# Patient Record
Sex: Female | Born: 1950
Health system: Southern US, Community
[De-identification: ages and names within clinical notes are randomized; demographics above are authoritative.]

## PROBLEM LIST (undated history)

## (undated) ENCOUNTER — Emergency Department (HOSPITAL_BASED_OUTPATIENT_CLINIC_OR_DEPARTMENT_OTHER): Disposition: A | Discharge status: Home / Self Care | Payer: Medicare Other

## (undated) DIAGNOSIS — C50919 Malignant neoplasm of unspecified site of unspecified female breast: Secondary | ICD-10-CM

## (undated) DIAGNOSIS — M1990 Unspecified osteoarthritis, unspecified site: Secondary | ICD-10-CM

## (undated) DIAGNOSIS — I2699 Other pulmonary embolism without acute cor pulmonale: Secondary | ICD-10-CM

## (undated) DIAGNOSIS — E785 Hyperlipidemia, unspecified: Secondary | ICD-10-CM

## (undated) DIAGNOSIS — R531 Weakness: Secondary | ICD-10-CM

## (undated) DIAGNOSIS — G4700 Insomnia, unspecified: Secondary | ICD-10-CM

## (undated) DIAGNOSIS — E079 Disorder of thyroid, unspecified: Secondary | ICD-10-CM

## (undated) DIAGNOSIS — I10 Essential (primary) hypertension: Secondary | ICD-10-CM

## (undated) DIAGNOSIS — S92902A Unspecified fracture of left foot, initial encounter for closed fracture: Secondary | ICD-10-CM

## (undated) DIAGNOSIS — E279 Disorder of adrenal gland, unspecified: Secondary | ICD-10-CM

## (undated) DIAGNOSIS — Z923 Personal history of irradiation: Secondary | ICD-10-CM

## (undated) DIAGNOSIS — H269 Unspecified cataract: Secondary | ICD-10-CM

## (undated) DIAGNOSIS — R42 Dizziness and giddiness: Secondary | ICD-10-CM

## (undated) DIAGNOSIS — Z8719 Personal history of other diseases of the digestive system: Secondary | ICD-10-CM

## (undated) DIAGNOSIS — E039 Hypothyroidism, unspecified: Secondary | ICD-10-CM

## (undated) DIAGNOSIS — E278 Other specified disorders of adrenal gland: Secondary | ICD-10-CM

## (undated) HISTORY — DX: Unspecified cataract: H26.9

## (undated) HISTORY — PX: REPLACEMENT TOTAL KNEE: SUR1224

## (undated) HISTORY — DX: Hyperlipidemia, unspecified: E78.5

## (undated) HISTORY — DX: Unspecified osteoarthritis, unspecified site: M19.90

## (undated) HISTORY — PX: EYE SURGERY: SHX253

## (undated) HISTORY — DX: Unspecified fracture of left foot, initial encounter for closed fracture: S92.902A

## (undated) HISTORY — DX: Insomnia, unspecified: G47.00

## (undated) HISTORY — PX: APPENDECTOMY: SHX54

## (undated) HISTORY — DX: Essential (primary) hypertension: I10

## (undated) HISTORY — DX: Other specified disorders of adrenal gland: E27.8

## (undated) HISTORY — PX: CARPAL TUNNEL RELEASE: SHX101

## (undated) HISTORY — PX: COLONOSCOPY: SHX174

## (undated) HISTORY — DX: Malignant neoplasm of unspecified site of unspecified female breast: C50.919

## (undated) HISTORY — PX: CHOLECYSTECTOMY: SHX55

## (undated) HISTORY — DX: Disorder of adrenal gland, unspecified: E27.9

## (undated) HISTORY — DX: Other pulmonary embolism without acute cor pulmonale: I26.99

## (undated) HISTORY — PX: OTHER SURGICAL HISTORY: SHX169

---

## 1998-01-01 ENCOUNTER — Ambulatory Visit (HOSPITAL_COMMUNITY): Admission: RE | Admit: 1998-01-01 | Discharge: 1998-01-01 | Payer: Self-pay | Admitting: Obstetrics and Gynecology

## 1998-01-12 ENCOUNTER — Emergency Department (HOSPITAL_COMMUNITY): Admission: EM | Admit: 1998-01-12 | Discharge: 1998-01-12 | Payer: Self-pay | Admitting: Endocrinology

## 1998-12-24 ENCOUNTER — Encounter: Payer: Self-pay | Admitting: Orthopedic Surgery

## 1998-12-27 ENCOUNTER — Inpatient Hospital Stay (HOSPITAL_COMMUNITY): Admission: RE | Admit: 1998-12-27 | Discharge: 1999-01-06 | Payer: Self-pay | Admitting: Orthopedic Surgery

## 1998-12-28 ENCOUNTER — Encounter: Payer: Self-pay | Admitting: Orthopedic Surgery

## 1999-01-06 ENCOUNTER — Inpatient Hospital Stay
Admission: RE | Admit: 1999-01-06 | Discharge: 1999-01-29 | Payer: Self-pay | Admitting: Physical Medicine & Rehabilitation

## 1999-02-11 ENCOUNTER — Ambulatory Visit (HOSPITAL_COMMUNITY): Admission: RE | Admit: 1999-02-11 | Discharge: 1999-02-11 | Payer: Self-pay | Admitting: Orthopedic Surgery

## 1999-02-24 ENCOUNTER — Encounter: Admission: RE | Admit: 1999-02-24 | Discharge: 1999-05-25 | Payer: Self-pay | Admitting: Orthopedic Surgery

## 1999-04-01 ENCOUNTER — Ambulatory Visit (HOSPITAL_COMMUNITY): Admission: RE | Admit: 1999-04-01 | Discharge: 1999-04-01 | Payer: Self-pay | Admitting: Orthopedic Surgery

## 1999-05-09 ENCOUNTER — Encounter: Admission: RE | Admit: 1999-05-09 | Discharge: 1999-05-09 | Payer: Self-pay | Admitting: Obstetrics and Gynecology

## 1999-05-09 ENCOUNTER — Encounter: Payer: Self-pay | Admitting: Obstetrics and Gynecology

## 1999-10-14 DIAGNOSIS — I2699 Other pulmonary embolism without acute cor pulmonale: Secondary | ICD-10-CM

## 1999-10-14 HISTORY — DX: Other pulmonary embolism without acute cor pulmonale: I26.99

## 1999-11-11 ENCOUNTER — Encounter: Payer: Self-pay | Admitting: Emergency Medicine

## 1999-11-12 ENCOUNTER — Inpatient Hospital Stay (HOSPITAL_COMMUNITY): Admission: EM | Admit: 1999-11-12 | Discharge: 1999-11-15 | Payer: Self-pay | Admitting: Emergency Medicine

## 1999-11-12 ENCOUNTER — Encounter: Payer: Self-pay | Admitting: Emergency Medicine

## 2000-01-01 ENCOUNTER — Emergency Department (HOSPITAL_COMMUNITY): Admission: EM | Admit: 2000-01-01 | Discharge: 2000-01-01 | Payer: Self-pay

## 2000-05-10 ENCOUNTER — Encounter: Payer: Self-pay | Admitting: Obstetrics and Gynecology

## 2000-05-10 ENCOUNTER — Encounter: Admission: RE | Admit: 2000-05-10 | Discharge: 2000-05-10 | Payer: Self-pay | Admitting: Obstetrics and Gynecology

## 2000-05-13 ENCOUNTER — Encounter: Admission: RE | Admit: 2000-05-13 | Discharge: 2000-05-13 | Payer: Self-pay | Admitting: Obstetrics and Gynecology

## 2000-05-13 ENCOUNTER — Encounter: Payer: Self-pay | Admitting: Obstetrics and Gynecology

## 2000-05-18 ENCOUNTER — Other Ambulatory Visit: Admission: RE | Admit: 2000-05-18 | Discharge: 2000-05-18 | Payer: Self-pay | Admitting: Obstetrics and Gynecology

## 2000-05-27 ENCOUNTER — Encounter: Payer: Self-pay | Admitting: Obstetrics and Gynecology

## 2000-05-27 ENCOUNTER — Encounter: Admission: RE | Admit: 2000-05-27 | Discharge: 2000-05-27 | Payer: Self-pay | Admitting: Obstetrics and Gynecology

## 2001-05-18 ENCOUNTER — Encounter: Admission: RE | Admit: 2001-05-18 | Discharge: 2001-05-18 | Payer: Self-pay | Admitting: Obstetrics and Gynecology

## 2001-05-18 ENCOUNTER — Encounter: Payer: Self-pay | Admitting: Obstetrics and Gynecology

## 2001-05-31 ENCOUNTER — Encounter: Admission: RE | Admit: 2001-05-31 | Discharge: 2001-05-31 | Payer: Self-pay | Admitting: Obstetrics and Gynecology

## 2001-05-31 ENCOUNTER — Encounter: Payer: Self-pay | Admitting: Obstetrics and Gynecology

## 2001-06-02 ENCOUNTER — Other Ambulatory Visit: Admission: RE | Admit: 2001-06-02 | Discharge: 2001-06-02 | Payer: Self-pay | Admitting: Obstetrics and Gynecology

## 2001-12-30 ENCOUNTER — Emergency Department (HOSPITAL_COMMUNITY): Admission: EM | Admit: 2001-12-30 | Discharge: 2001-12-30 | Payer: Self-pay | Admitting: Emergency Medicine

## 2001-12-30 ENCOUNTER — Encounter: Payer: Self-pay | Admitting: Emergency Medicine

## 2002-01-10 ENCOUNTER — Encounter: Admission: RE | Admit: 2002-01-10 | Discharge: 2002-01-10 | Payer: Self-pay | Admitting: Family Medicine

## 2002-01-10 ENCOUNTER — Encounter: Payer: Self-pay | Admitting: Family Medicine

## 2002-05-11 ENCOUNTER — Emergency Department (HOSPITAL_COMMUNITY): Admission: EM | Admit: 2002-05-11 | Discharge: 2002-05-11 | Payer: Self-pay | Admitting: Emergency Medicine

## 2002-05-11 ENCOUNTER — Encounter: Payer: Self-pay | Admitting: Emergency Medicine

## 2002-06-01 ENCOUNTER — Encounter: Admission: RE | Admit: 2002-06-01 | Discharge: 2002-06-01 | Payer: Self-pay | Admitting: Obstetrics and Gynecology

## 2002-06-01 ENCOUNTER — Encounter: Payer: Self-pay | Admitting: Obstetrics and Gynecology

## 2002-06-28 ENCOUNTER — Inpatient Hospital Stay (HOSPITAL_COMMUNITY): Admission: RE | Admit: 2002-06-28 | Discharge: 2002-07-04 | Payer: Self-pay | Admitting: Orthopedic Surgery

## 2002-06-28 ENCOUNTER — Encounter: Payer: Self-pay | Admitting: Orthopedic Surgery

## 2002-07-04 ENCOUNTER — Inpatient Hospital Stay
Admission: RE | Admit: 2002-07-04 | Discharge: 2002-07-27 | Payer: Self-pay | Admitting: Physical Medicine & Rehabilitation

## 2002-08-23 ENCOUNTER — Encounter: Admission: RE | Admit: 2002-08-23 | Discharge: 2002-11-21 | Payer: Self-pay | Admitting: Orthopedic Surgery

## 2002-11-22 ENCOUNTER — Encounter: Admission: RE | Admit: 2002-11-22 | Discharge: 2003-02-20 | Payer: Self-pay | Admitting: Orthopedic Surgery

## 2003-02-21 ENCOUNTER — Encounter: Admission: RE | Admit: 2003-02-21 | Discharge: 2003-02-22 | Payer: Self-pay | Admitting: Orthopedic Surgery

## 2003-07-18 ENCOUNTER — Encounter: Admission: RE | Admit: 2003-07-18 | Discharge: 2003-07-18 | Payer: Self-pay | Admitting: Family Medicine

## 2004-06-15 DIAGNOSIS — C50919 Malignant neoplasm of unspecified site of unspecified female breast: Secondary | ICD-10-CM

## 2004-06-15 HISTORY — PX: BREAST LUMPECTOMY: SHX2

## 2004-06-15 HISTORY — DX: Malignant neoplasm of unspecified site of unspecified female breast: C50.919

## 2004-08-28 ENCOUNTER — Ambulatory Visit: Payer: Self-pay | Admitting: Family Medicine

## 2004-09-01 ENCOUNTER — Encounter: Admission: RE | Admit: 2004-09-01 | Discharge: 2004-09-01 | Payer: Self-pay | Admitting: Family Medicine

## 2004-09-08 ENCOUNTER — Encounter: Admission: RE | Admit: 2004-09-08 | Discharge: 2004-09-08 | Payer: Self-pay | Admitting: Family Medicine

## 2004-10-22 ENCOUNTER — Encounter (INDEPENDENT_AMBULATORY_CARE_PROVIDER_SITE_OTHER): Payer: Self-pay | Admitting: Specialist

## 2004-10-22 ENCOUNTER — Encounter: Admission: RE | Admit: 2004-10-22 | Discharge: 2004-10-22 | Payer: Self-pay | Admitting: Family Medicine

## 2004-10-22 ENCOUNTER — Encounter (INDEPENDENT_AMBULATORY_CARE_PROVIDER_SITE_OTHER): Payer: Self-pay | Admitting: Diagnostic Radiology

## 2004-10-27 ENCOUNTER — Ambulatory Visit: Payer: Self-pay | Admitting: Family Medicine

## 2004-10-28 ENCOUNTER — Encounter: Admission: RE | Admit: 2004-10-28 | Discharge: 2004-10-28 | Payer: Self-pay | Admitting: General Surgery

## 2004-10-30 ENCOUNTER — Ambulatory Visit (HOSPITAL_BASED_OUTPATIENT_CLINIC_OR_DEPARTMENT_OTHER): Admission: RE | Admit: 2004-10-30 | Discharge: 2004-10-30 | Payer: Self-pay | Admitting: General Surgery

## 2004-10-30 ENCOUNTER — Encounter (INDEPENDENT_AMBULATORY_CARE_PROVIDER_SITE_OTHER): Payer: Self-pay | Admitting: *Deleted

## 2004-10-30 ENCOUNTER — Ambulatory Visit (HOSPITAL_COMMUNITY): Admission: RE | Admit: 2004-10-30 | Discharge: 2004-10-30 | Payer: Self-pay | Admitting: General Surgery

## 2004-11-28 ENCOUNTER — Ambulatory Visit: Payer: Self-pay | Admitting: Family Medicine

## 2004-11-28 ENCOUNTER — Ambulatory Visit: Payer: Self-pay | Admitting: Oncology

## 2004-12-15 ENCOUNTER — Ambulatory Visit: Admission: RE | Admit: 2004-12-15 | Discharge: 2005-02-11 | Payer: Self-pay | Admitting: Radiation Oncology

## 2005-01-26 ENCOUNTER — Ambulatory Visit: Payer: Self-pay | Admitting: Family Medicine

## 2005-02-06 ENCOUNTER — Ambulatory Visit: Payer: Self-pay | Admitting: Oncology

## 2005-02-10 ENCOUNTER — Ambulatory Visit: Payer: Self-pay | Admitting: Family Medicine

## 2005-02-11 ENCOUNTER — Encounter: Admission: RE | Admit: 2005-02-11 | Discharge: 2005-02-11 | Payer: Self-pay | Admitting: Family Medicine

## 2005-03-11 ENCOUNTER — Ambulatory Visit: Payer: Self-pay | Admitting: Endocrinology

## 2005-03-20 ENCOUNTER — Ambulatory Visit: Payer: Self-pay | Admitting: Family Medicine

## 2005-03-25 ENCOUNTER — Ambulatory Visit: Payer: Self-pay | Admitting: Endocrinology

## 2005-04-01 ENCOUNTER — Ambulatory Visit: Payer: Self-pay | Admitting: Endocrinology

## 2005-04-03 ENCOUNTER — Ambulatory Visit: Payer: Self-pay | Admitting: Oncology

## 2005-04-08 ENCOUNTER — Ambulatory Visit: Payer: Self-pay | Admitting: Endocrinology

## 2005-06-25 ENCOUNTER — Encounter: Admission: RE | Admit: 2005-06-25 | Discharge: 2005-06-25 | Payer: Self-pay | Admitting: Surgery

## 2005-07-09 ENCOUNTER — Ambulatory Visit: Payer: Self-pay | Admitting: Endocrinology

## 2005-07-20 ENCOUNTER — Encounter: Admission: RE | Admit: 2005-07-20 | Discharge: 2005-07-20 | Payer: Self-pay | Admitting: General Surgery

## 2005-08-08 ENCOUNTER — Encounter: Admission: RE | Admit: 2005-08-08 | Discharge: 2005-08-08 | Payer: Self-pay | Admitting: Endocrinology

## 2005-10-05 ENCOUNTER — Ambulatory Visit: Payer: Self-pay | Admitting: Oncology

## 2005-10-05 LAB — CBC WITH DIFFERENTIAL/PLATELET
Basophils Absolute: 0 10*3/uL (ref 0.0–0.1)
Eosinophils Absolute: 0.1 10*3/uL (ref 0.0–0.5)
HGB: 11.3 g/dL — ABNORMAL LOW (ref 11.6–15.9)
NEUT#: 5.6 10*3/uL (ref 1.5–6.5)
RBC: 3.97 10*6/uL (ref 3.70–5.32)
RDW: 17.4 % — ABNORMAL HIGH (ref 11.3–14.5)
WBC: 7.3 10*3/uL (ref 3.9–10.0)
lymph#: 1.3 10*3/uL (ref 0.9–3.3)

## 2005-10-05 LAB — COMPREHENSIVE METABOLIC PANEL
AST: 15 U/L (ref 0–37)
Albumin: 4 g/dL (ref 3.5–5.2)
BUN: 14 mg/dL (ref 6–23)
Calcium: 9.1 mg/dL (ref 8.4–10.5)
Chloride: 103 mEq/L (ref 96–112)
Glucose, Bld: 118 mg/dL — ABNORMAL HIGH (ref 70–99)
Potassium: 3.6 mEq/L (ref 3.5–5.3)
Sodium: 138 mEq/L (ref 135–145)
Total Protein: 7 g/dL (ref 6.0–8.3)

## 2005-11-03 ENCOUNTER — Encounter: Admission: RE | Admit: 2005-11-03 | Discharge: 2005-11-03 | Payer: Self-pay | Admitting: Oncology

## 2005-11-16 ENCOUNTER — Ambulatory Visit: Payer: Self-pay | Admitting: Family Medicine

## 2005-11-17 ENCOUNTER — Encounter: Admission: RE | Admit: 2005-11-17 | Discharge: 2005-11-17 | Payer: Self-pay | Admitting: Family Medicine

## 2005-11-19 ENCOUNTER — Ambulatory Visit: Payer: Self-pay | Admitting: Family Medicine

## 2006-03-03 ENCOUNTER — Ambulatory Visit: Payer: Self-pay | Admitting: Family Medicine

## 2006-03-15 ENCOUNTER — Ambulatory Visit: Payer: Self-pay | Admitting: Family Medicine

## 2006-04-01 ENCOUNTER — Ambulatory Visit: Payer: Self-pay | Admitting: Family Medicine

## 2006-04-01 ENCOUNTER — Ambulatory Visit: Payer: Self-pay | Admitting: Oncology

## 2006-04-08 ENCOUNTER — Encounter: Admission: RE | Admit: 2006-04-08 | Discharge: 2006-04-08 | Payer: Self-pay | Admitting: Oncology

## 2006-04-08 ENCOUNTER — Encounter (INDEPENDENT_AMBULATORY_CARE_PROVIDER_SITE_OTHER): Payer: Self-pay | Admitting: Specialist

## 2006-05-13 ENCOUNTER — Ambulatory Visit: Payer: Self-pay | Admitting: Family Medicine

## 2006-05-20 ENCOUNTER — Emergency Department (HOSPITAL_COMMUNITY): Admission: EM | Admit: 2006-05-20 | Discharge: 2006-05-20 | Payer: Self-pay | Admitting: Emergency Medicine

## 2006-05-21 ENCOUNTER — Ambulatory Visit: Payer: Self-pay | Admitting: Emergency Medicine

## 2006-05-21 LAB — CONVERTED CEMR LAB
Alkaline Phosphatase: 114 units/L (ref 39–117)
BUN: 12 mg/dL (ref 6–23)
Calcium: 9.4 mg/dL (ref 8.4–10.5)
Creatinine, Ser: 0.7 mg/dL (ref 0.4–1.2)
GFR calc non Af Amer: 92 mL/min
Potassium: 4.2 meq/L (ref 3.5–5.1)
Sodium: 141 meq/L (ref 135–145)
Total Protein: 7 g/dL (ref 6.0–8.3)

## 2006-09-29 ENCOUNTER — Ambulatory Visit: Payer: Self-pay | Admitting: Oncology

## 2006-09-29 DIAGNOSIS — E785 Hyperlipidemia, unspecified: Secondary | ICD-10-CM | POA: Insufficient documentation

## 2006-09-29 DIAGNOSIS — Z9889 Other specified postprocedural states: Secondary | ICD-10-CM

## 2006-09-29 DIAGNOSIS — Z853 Personal history of malignant neoplasm of breast: Secondary | ICD-10-CM

## 2006-09-29 DIAGNOSIS — I10 Essential (primary) hypertension: Secondary | ICD-10-CM | POA: Insufficient documentation

## 2006-09-29 DIAGNOSIS — Z96659 Presence of unspecified artificial knee joint: Secondary | ICD-10-CM

## 2006-09-29 DIAGNOSIS — M1990 Unspecified osteoarthritis, unspecified site: Secondary | ICD-10-CM | POA: Insufficient documentation

## 2006-09-29 DIAGNOSIS — J811 Chronic pulmonary edema: Secondary | ICD-10-CM

## 2006-09-29 DIAGNOSIS — I82409 Acute embolism and thrombosis of unspecified deep veins of unspecified lower extremity: Secondary | ICD-10-CM | POA: Insufficient documentation

## 2006-09-29 DIAGNOSIS — G839 Paralytic syndrome, unspecified: Secondary | ICD-10-CM

## 2006-10-04 LAB — CBC WITH DIFFERENTIAL/PLATELET
BASO%: 0.2 % (ref 0.0–2.0)
Eosinophils Absolute: 0.1 10*3/uL (ref 0.0–0.5)
LYMPH%: 21.1 % (ref 14.0–48.0)
MCHC: 34.3 g/dL (ref 32.0–36.0)
MCV: 83 fL (ref 81.0–101.0)
MONO%: 4.4 % (ref 0.0–13.0)
NEUT#: 5.6 10*3/uL (ref 1.5–6.5)
Platelets: 238 10*3/uL (ref 145–400)
RBC: 4.17 10*6/uL (ref 3.70–5.32)
RDW: 16.9 % — ABNORMAL HIGH (ref 11.3–14.5)
WBC: 7.6 10*3/uL (ref 3.9–10.0)

## 2006-11-09 ENCOUNTER — Encounter: Admission: RE | Admit: 2006-11-09 | Discharge: 2006-11-09 | Payer: Self-pay | Admitting: Oncology

## 2007-01-20 ENCOUNTER — Encounter: Payer: Self-pay | Admitting: Family Medicine

## 2007-03-12 ENCOUNTER — Encounter: Payer: Self-pay | Admitting: *Deleted

## 2007-03-12 DIAGNOSIS — Z86718 Personal history of other venous thrombosis and embolism: Secondary | ICD-10-CM | POA: Insufficient documentation

## 2007-03-24 ENCOUNTER — Ambulatory Visit: Payer: Self-pay | Admitting: Oncology

## 2007-03-28 LAB — COMPREHENSIVE METABOLIC PANEL
AST: 16 U/L (ref 0–37)
Albumin: 4 g/dL (ref 3.5–5.2)
BUN: 11 mg/dL (ref 6–23)
Calcium: 9.7 mg/dL (ref 8.4–10.5)
Chloride: 103 mEq/L (ref 96–112)
Creatinine, Ser: 0.51 mg/dL (ref 0.40–1.20)
Glucose, Bld: 111 mg/dL — ABNORMAL HIGH (ref 70–99)
Potassium: 4.2 mEq/L (ref 3.5–5.3)

## 2007-03-28 LAB — CBC WITH DIFFERENTIAL/PLATELET
BASO%: 0.3 % (ref 0.0–2.0)
Eosinophils Absolute: 0.1 10*3/uL (ref 0.0–0.5)
LYMPH%: 23.1 % (ref 14.0–48.0)
MCHC: 34.2 g/dL (ref 32.0–36.0)
MONO#: 0.4 10*3/uL (ref 0.1–0.9)
NEUT#: 5 10*3/uL (ref 1.5–6.5)
Platelets: 224 10*3/uL (ref 145–400)
RBC: 4.07 10*6/uL (ref 3.70–5.32)
RDW: 16.2 % — ABNORMAL HIGH (ref 11.3–14.5)
WBC: 7.1 10*3/uL (ref 3.9–10.0)
lymph#: 1.6 10*3/uL (ref 0.9–3.3)

## 2007-03-30 ENCOUNTER — Ambulatory Visit: Payer: Self-pay | Admitting: Family Medicine

## 2007-04-04 ENCOUNTER — Encounter: Payer: Self-pay | Admitting: Family Medicine

## 2007-04-28 ENCOUNTER — Ambulatory Visit: Payer: Self-pay | Admitting: Family Medicine

## 2007-04-28 ENCOUNTER — Ambulatory Visit: Payer: Self-pay

## 2007-04-28 ENCOUNTER — Telehealth: Payer: Self-pay | Admitting: Family Medicine

## 2007-04-28 DIAGNOSIS — M79609 Pain in unspecified limb: Secondary | ICD-10-CM | POA: Insufficient documentation

## 2007-04-29 ENCOUNTER — Encounter: Payer: Self-pay | Admitting: Family Medicine

## 2007-05-02 ENCOUNTER — Encounter (INDEPENDENT_AMBULATORY_CARE_PROVIDER_SITE_OTHER): Payer: Self-pay | Admitting: *Deleted

## 2007-05-04 ENCOUNTER — Encounter (INDEPENDENT_AMBULATORY_CARE_PROVIDER_SITE_OTHER): Payer: Self-pay | Admitting: *Deleted

## 2007-07-12 ENCOUNTER — Encounter: Payer: Self-pay | Admitting: Family Medicine

## 2007-07-13 ENCOUNTER — Encounter: Payer: Self-pay | Admitting: Family Medicine

## 2007-07-15 ENCOUNTER — Encounter: Payer: Self-pay | Admitting: Family Medicine

## 2007-08-01 ENCOUNTER — Ambulatory Visit: Payer: Self-pay | Admitting: Family Medicine

## 2007-08-10 ENCOUNTER — Encounter: Payer: Self-pay | Admitting: Family Medicine

## 2007-08-12 LAB — CONVERTED CEMR LAB
Albumin: 3.5 g/dL (ref 3.5–5.2)
Alkaline Phosphatase: 93 units/L (ref 39–117)
Bilirubin, Direct: 0.1 mg/dL (ref 0.0–0.3)
HDL: 32.9 mg/dL — ABNORMAL LOW (ref >39.0)
Total Bilirubin: 0.9 mg/dL (ref 0.3–1.2)
Total Protein: 7.4 g/dL (ref 6.0–8.3)
Triglycerides: 81 mg/dL (ref 0–149)

## 2007-08-18 ENCOUNTER — Telehealth (INDEPENDENT_AMBULATORY_CARE_PROVIDER_SITE_OTHER): Payer: Self-pay | Admitting: *Deleted

## 2007-09-28 ENCOUNTER — Ambulatory Visit: Payer: Self-pay | Admitting: Oncology

## 2007-10-03 ENCOUNTER — Encounter: Payer: Self-pay | Admitting: Family Medicine

## 2007-10-03 LAB — CBC WITH DIFFERENTIAL/PLATELET
Basophils Absolute: 0 10*3/uL (ref 0.0–0.1)
EOS%: 0.8 % (ref 0.0–7.0)
HCT: 33.7 % — ABNORMAL LOW (ref 34.8–46.6)
HGB: 11.3 g/dL — ABNORMAL LOW (ref 11.6–15.9)
MCH: 27.6 pg (ref 26.0–34.0)
MCV: 82.5 fL (ref 81.0–101.0)
NEUT%: 75.5 % (ref 39.6–76.8)
lymph#: 1.6 10*3/uL (ref 0.9–3.3)

## 2007-10-03 LAB — COMPREHENSIVE METABOLIC PANEL
AST: 23 U/L (ref 0–37)
BUN: 13 mg/dL (ref 6–23)
Calcium: 8.5 mg/dL (ref 8.4–10.5)
Chloride: 108 mEq/L (ref 96–112)
Creatinine, Ser: 0.5 mg/dL (ref 0.40–1.20)
Glucose, Bld: 121 mg/dL — ABNORMAL HIGH (ref 70–99)

## 2007-10-04 LAB — CANCER ANTIGEN 27.29: CA 27.29: 10 U/mL (ref 0–39)

## 2007-10-04 LAB — VITAMIN D 25 HYDROXY (VIT D DEFICIENCY, FRACTURES): Vit D, 25-Hydroxy: 44 ng/mL (ref 30–89)

## 2007-10-06 ENCOUNTER — Ambulatory Visit: Payer: Self-pay | Admitting: Family Medicine

## 2007-10-06 ENCOUNTER — Encounter: Admission: RE | Admit: 2007-10-06 | Discharge: 2007-10-06 | Payer: Self-pay | Admitting: Oncology

## 2007-10-06 ENCOUNTER — Encounter (INDEPENDENT_AMBULATORY_CARE_PROVIDER_SITE_OTHER): Payer: Self-pay | Admitting: *Deleted

## 2007-10-06 DIAGNOSIS — R609 Edema, unspecified: Secondary | ICD-10-CM | POA: Insufficient documentation

## 2007-10-06 LAB — CONVERTED CEMR LAB
BUN: 12 mg/dL (ref 6–23)
CO2: 32 meq/L (ref 19–32)
Calcium: 9.6 mg/dL (ref 8.4–10.5)
Chloride: 104 meq/L (ref 96–112)
GFR calc Af Amer: 132 mL/min
Glucose, Bld: 96 mg/dL (ref 70–99)
Sodium: 143 meq/L (ref 135–145)

## 2007-10-09 LAB — VITAMIN D 1,25 DIHYDROXY: Vit D, 1,25-Dihydroxy: 36 pg/mL (ref 15–75)

## 2007-10-17 ENCOUNTER — Telehealth (INDEPENDENT_AMBULATORY_CARE_PROVIDER_SITE_OTHER): Payer: Self-pay | Admitting: *Deleted

## 2007-10-18 ENCOUNTER — Telehealth (INDEPENDENT_AMBULATORY_CARE_PROVIDER_SITE_OTHER): Payer: Self-pay | Admitting: *Deleted

## 2007-10-18 ENCOUNTER — Ambulatory Visit: Payer: Self-pay | Admitting: Internal Medicine

## 2007-10-18 DIAGNOSIS — J209 Acute bronchitis, unspecified: Secondary | ICD-10-CM

## 2007-10-20 ENCOUNTER — Ambulatory Visit: Payer: Self-pay | Admitting: Internal Medicine

## 2007-10-26 LAB — CONVERTED CEMR LAB
BUN: 14 mg/dL (ref 6–23)
Creatinine, Ser: 0.7 mg/dL (ref 0.4–1.2)
Potassium: 3.5 meq/L (ref 3.5–5.1)

## 2007-11-25 ENCOUNTER — Telehealth: Payer: Self-pay | Admitting: Family Medicine

## 2007-11-28 ENCOUNTER — Encounter (INDEPENDENT_AMBULATORY_CARE_PROVIDER_SITE_OTHER): Payer: Self-pay | Admitting: *Deleted

## 2007-11-29 ENCOUNTER — Telehealth (INDEPENDENT_AMBULATORY_CARE_PROVIDER_SITE_OTHER): Payer: Self-pay | Admitting: *Deleted

## 2007-11-30 ENCOUNTER — Encounter (INDEPENDENT_AMBULATORY_CARE_PROVIDER_SITE_OTHER): Payer: Self-pay | Admitting: *Deleted

## 2007-12-01 ENCOUNTER — Encounter: Admission: RE | Admit: 2007-12-01 | Discharge: 2007-12-01 | Payer: Self-pay | Admitting: Oncology

## 2007-12-07 ENCOUNTER — Encounter: Payer: Self-pay | Admitting: Internal Medicine

## 2007-12-07 ENCOUNTER — Ambulatory Visit: Admission: RE | Admit: 2007-12-07 | Discharge: 2007-12-07 | Payer: Self-pay | Admitting: Internal Medicine

## 2007-12-07 ENCOUNTER — Ambulatory Visit: Payer: Self-pay | Admitting: Internal Medicine

## 2007-12-07 ENCOUNTER — Ambulatory Visit: Payer: Self-pay | Admitting: Vascular Surgery

## 2007-12-08 ENCOUNTER — Telehealth (INDEPENDENT_AMBULATORY_CARE_PROVIDER_SITE_OTHER): Payer: Self-pay | Admitting: *Deleted

## 2007-12-23 ENCOUNTER — Emergency Department (HOSPITAL_COMMUNITY): Admission: EM | Admit: 2007-12-23 | Discharge: 2007-12-23 | Payer: Self-pay | Admitting: Emergency Medicine

## 2007-12-23 ENCOUNTER — Telehealth: Payer: Self-pay | Admitting: Family Medicine

## 2007-12-27 ENCOUNTER — Ambulatory Visit: Payer: Self-pay | Admitting: Family Medicine

## 2007-12-27 DIAGNOSIS — N390 Urinary tract infection, site not specified: Secondary | ICD-10-CM

## 2007-12-27 DIAGNOSIS — H6590 Unspecified nonsuppurative otitis media, unspecified ear: Secondary | ICD-10-CM | POA: Insufficient documentation

## 2007-12-29 ENCOUNTER — Encounter (INDEPENDENT_AMBULATORY_CARE_PROVIDER_SITE_OTHER): Payer: Self-pay | Admitting: *Deleted

## 2008-01-04 ENCOUNTER — Ambulatory Visit: Payer: Self-pay | Admitting: Family Medicine

## 2008-01-04 DIAGNOSIS — R7309 Other abnormal glucose: Secondary | ICD-10-CM

## 2008-01-15 ENCOUNTER — Telehealth (INDEPENDENT_AMBULATORY_CARE_PROVIDER_SITE_OTHER): Payer: Self-pay | Admitting: *Deleted

## 2008-01-15 LAB — CONVERTED CEMR LAB
AST: 21 units/L (ref 0–37)
Albumin: 3.5 g/dL (ref 3.5–5.2)
Alkaline Phosphatase: 129 units/L — ABNORMAL HIGH (ref 39–117)
Basophils Absolute: 0 10*3/uL (ref 0.0–0.1)
Basophils Relative: 0.6 % (ref 0.0–3.0)
CO2: 26 meq/L (ref 19–32)
Calcium: 9 mg/dL (ref 8.4–10.5)
Chloride: 113 meq/L — ABNORMAL HIGH (ref 96–112)
GFR calc non Af Amer: 91 mL/min
HCT: 33.6 % — ABNORMAL LOW (ref 36.0–46.0)
HDL: 26.8 mg/dL — ABNORMAL LOW (ref >39.0)
Hemoglobin: 11.2 g/dL — ABNORMAL LOW (ref 12.0–15.0)
Hgb A1c MFr Bld: 5.2 % (ref 4.6–6.0)
Lymphocytes Relative: 23.2 % (ref 12.0–46.0)
MCHC: 33.2 g/dL (ref 30.0–36.0)
Monocytes Absolute: 0.4 10*3/uL (ref 0.1–1.0)
Neutrophils Relative %: 69.9 % (ref 43.0–77.0)
Potassium: 4.9 meq/L (ref 3.5–5.1)
RBC: 3.9 M/uL (ref 3.87–5.11)
RDW: 16 % — ABNORMAL HIGH (ref 11.5–14.6)
Total Protein: 7.4 g/dL (ref 6.0–8.3)
Triglycerides: 84 mg/dL (ref 0–149)
WBC: 6.9 10*3/uL (ref 4.5–10.5)

## 2008-01-16 ENCOUNTER — Ambulatory Visit: Payer: Self-pay | Admitting: Family Medicine

## 2008-02-08 ENCOUNTER — Ambulatory Visit: Payer: Self-pay | Admitting: Family Medicine

## 2008-02-15 LAB — CONVERTED CEMR LAB
ALT: 22 units/L (ref 0–35)
Bilirubin, Direct: 0.1 mg/dL (ref 0.0–0.3)
Cholesterol: 140 mg/dL (ref 0–200)
HDL: 26.7 mg/dL — ABNORMAL LOW (ref >39.0)
LDL Cholesterol: 94 mg/dL (ref 0–99)
Total Bilirubin: 0.7 mg/dL (ref 0.3–1.2)
Total CHOL/HDL Ratio: 5.2
VLDL: 19 mg/dL (ref 0–40)

## 2008-02-16 ENCOUNTER — Encounter (INDEPENDENT_AMBULATORY_CARE_PROVIDER_SITE_OTHER): Payer: Self-pay | Admitting: *Deleted

## 2008-03-07 ENCOUNTER — Ambulatory Visit: Payer: Self-pay | Admitting: Family Medicine

## 2008-03-30 ENCOUNTER — Ambulatory Visit: Payer: Self-pay | Admitting: Oncology

## 2008-04-03 ENCOUNTER — Encounter: Payer: Self-pay | Admitting: Family Medicine

## 2008-04-03 LAB — CANCER ANTIGEN 27.29: CA 27.29: 9 U/mL (ref 0–39)

## 2008-04-03 LAB — CBC WITH DIFFERENTIAL/PLATELET
BASO%: 0.4 % (ref 0.0–2.0)
Eosinophils Absolute: 0.1 10*3/uL (ref 0.0–0.5)
LYMPH%: 22.6 % (ref 14.0–48.0)
MCHC: 33.2 g/dL (ref 32.0–36.0)
MONO#: 0.3 10*3/uL (ref 0.1–0.9)
NEUT#: 4.9 10*3/uL (ref 1.5–6.5)
Platelets: 222 10*3/uL (ref 145–400)
RBC: 3.99 10*6/uL (ref 3.70–5.32)
RDW: 16.7 % — ABNORMAL HIGH (ref 11.3–14.5)
WBC: 6.9 10*3/uL (ref 3.9–10.0)
lymph#: 1.6 10*3/uL (ref 0.9–3.3)

## 2008-04-03 LAB — COMPREHENSIVE METABOLIC PANEL
ALT: 27 U/L (ref 0–35)
Albumin: 3.4 g/dL — ABNORMAL LOW (ref 3.5–5.2)
CO2: 27 mEq/L (ref 19–32)
Potassium: 4 mEq/L (ref 3.5–5.3)
Sodium: 141 mEq/L (ref 135–145)
Total Bilirubin: 0.9 mg/dL (ref 0.3–1.2)
Total Protein: 6.8 g/dL (ref 6.0–8.3)

## 2008-04-27 ENCOUNTER — Ambulatory Visit: Payer: Self-pay | Admitting: Family Medicine

## 2008-04-27 DIAGNOSIS — M549 Dorsalgia, unspecified: Secondary | ICD-10-CM | POA: Insufficient documentation

## 2008-04-28 ENCOUNTER — Encounter: Payer: Self-pay | Admitting: Family Medicine

## 2008-04-30 ENCOUNTER — Encounter (INDEPENDENT_AMBULATORY_CARE_PROVIDER_SITE_OTHER): Payer: Self-pay | Admitting: *Deleted

## 2008-05-02 ENCOUNTER — Ambulatory Visit: Payer: Self-pay | Admitting: Family Medicine

## 2008-05-17 ENCOUNTER — Encounter (INDEPENDENT_AMBULATORY_CARE_PROVIDER_SITE_OTHER): Payer: Self-pay | Admitting: *Deleted

## 2008-05-17 ENCOUNTER — Telehealth (INDEPENDENT_AMBULATORY_CARE_PROVIDER_SITE_OTHER): Payer: Self-pay | Admitting: *Deleted

## 2008-05-21 ENCOUNTER — Telehealth: Payer: Self-pay | Admitting: Family Medicine

## 2008-05-23 ENCOUNTER — Telehealth (INDEPENDENT_AMBULATORY_CARE_PROVIDER_SITE_OTHER): Payer: Self-pay | Admitting: *Deleted

## 2008-06-12 ENCOUNTER — Encounter: Payer: Self-pay | Admitting: Family Medicine

## 2008-06-13 ENCOUNTER — Ambulatory Visit: Payer: Self-pay | Admitting: Family Medicine

## 2008-06-17 ENCOUNTER — Ambulatory Visit: Payer: Self-pay | Admitting: Vascular Surgery

## 2008-06-17 ENCOUNTER — Emergency Department (HOSPITAL_COMMUNITY): Admission: EM | Admit: 2008-06-17 | Discharge: 2008-06-17 | Payer: Self-pay | Admitting: Family Medicine

## 2008-06-17 ENCOUNTER — Encounter (INDEPENDENT_AMBULATORY_CARE_PROVIDER_SITE_OTHER): Payer: Self-pay | Admitting: Emergency Medicine

## 2008-06-17 LAB — CONVERTED CEMR LAB
Free T4: 0.7 ng/dL (ref 0.6–1.6)
T3, Free: 3.1 pg/mL (ref 2.3–4.2)

## 2008-06-18 ENCOUNTER — Encounter (INDEPENDENT_AMBULATORY_CARE_PROVIDER_SITE_OTHER): Payer: Self-pay | Admitting: *Deleted

## 2008-06-19 ENCOUNTER — Ambulatory Visit: Payer: Self-pay | Admitting: Family Medicine

## 2008-07-06 ENCOUNTER — Ambulatory Visit: Payer: Self-pay | Admitting: Family Medicine

## 2008-07-11 ENCOUNTER — Encounter: Payer: Self-pay | Admitting: Family Medicine

## 2008-07-11 ENCOUNTER — Encounter (INDEPENDENT_AMBULATORY_CARE_PROVIDER_SITE_OTHER): Payer: Self-pay | Admitting: *Deleted

## 2008-07-11 LAB — CONVERTED CEMR LAB: Vit D, 1,25-Dihydroxy: 34 (ref 30–89)

## 2008-07-13 ENCOUNTER — Telehealth: Payer: Self-pay | Admitting: Family Medicine

## 2008-08-02 ENCOUNTER — Ambulatory Visit: Payer: Self-pay | Admitting: Family Medicine

## 2008-08-30 ENCOUNTER — Ambulatory Visit: Payer: Self-pay | Admitting: Family Medicine

## 2008-08-30 DIAGNOSIS — M810 Age-related osteoporosis without current pathological fracture: Secondary | ICD-10-CM | POA: Insufficient documentation

## 2008-09-01 LAB — CONVERTED CEMR LAB
AST: 27 units/L (ref 0–37)
Albumin: 3.6 g/dL (ref 3.5–5.2)
Alkaline Phosphatase: 120 units/L — ABNORMAL HIGH (ref 39–117)
BUN: 11 mg/dL (ref 6–23)
Basophils Absolute: 0 10*3/uL (ref 0.0–0.1)
Basophils Relative: 0.2 % (ref 0.0–3.0)
Bilirubin, Direct: 0.2 mg/dL (ref 0.0–0.3)
CO2: 30 meq/L (ref 19–32)
Calcium: 9.1 mg/dL (ref 8.4–10.5)
Chloride: 103 meq/L (ref 96–112)
Creatinine, Ser: 0.6 mg/dL (ref 0.4–1.2)
Glucose, Bld: 108 mg/dL — ABNORMAL HIGH (ref 70–99)
HDL: 32.8 mg/dL — ABNORMAL LOW (ref >39.00)
Hemoglobin: 11.6 g/dL — ABNORMAL LOW (ref 12.0–15.0)
Hgb A1c MFr Bld: 5.3 % (ref 4.6–6.5)
Monocytes Relative: 5.2 % (ref 3.0–12.0)
Neutro Abs: 5.7 10*3/uL (ref 1.4–7.7)
Neutrophils Relative %: 71.6 % (ref 43.0–77.0)
Sodium: 141 meq/L (ref 135–145)
TSH: 5.07 microintl units/mL (ref 0.35–5.50)
Total Protein: 7.3 g/dL (ref 6.0–8.3)

## 2008-09-03 ENCOUNTER — Encounter (INDEPENDENT_AMBULATORY_CARE_PROVIDER_SITE_OTHER): Payer: Self-pay | Admitting: *Deleted

## 2008-09-07 ENCOUNTER — Encounter: Admission: RE | Admit: 2008-09-07 | Discharge: 2008-12-06 | Payer: Self-pay | Admitting: Family Medicine

## 2008-09-07 ENCOUNTER — Encounter: Payer: Self-pay | Admitting: Family Medicine

## 2008-10-01 ENCOUNTER — Ambulatory Visit: Payer: Self-pay | Admitting: Family Medicine

## 2008-10-02 ENCOUNTER — Ambulatory Visit: Payer: Self-pay | Admitting: Oncology

## 2008-10-11 ENCOUNTER — Encounter: Payer: Self-pay | Admitting: Family Medicine

## 2008-10-11 LAB — CBC WITH DIFFERENTIAL/PLATELET
BASO%: 0.3 % (ref 0.0–2.0)
Eosinophils Absolute: 0.1 10*3/uL (ref 0.0–0.5)
HCT: 35.4 % (ref 34.8–46.6)
LYMPH%: 23.6 % (ref 14.0–49.7)
MONO#: 0.4 10*3/uL (ref 0.1–0.9)
NEUT#: 5.2 10*3/uL (ref 1.5–6.5)
NEUT%: 70.2 % (ref 38.4–76.8)
Platelets: 200 10*3/uL (ref 145–400)
WBC: 7.4 10*3/uL (ref 3.9–10.3)
lymph#: 1.7 10*3/uL (ref 0.9–3.3)

## 2008-10-12 LAB — COMPREHENSIVE METABOLIC PANEL
Albumin: 4.2 g/dL (ref 3.5–5.2)
BUN: 12 mg/dL (ref 6–23)
CO2: 26 mEq/L (ref 19–32)
Calcium: 9.6 mg/dL (ref 8.4–10.5)
Chloride: 106 mEq/L (ref 96–112)
Creatinine, Ser: 0.47 mg/dL (ref 0.40–1.20)
Glucose, Bld: 92 mg/dL (ref 70–99)
Potassium: 4.1 mEq/L (ref 3.5–5.3)

## 2008-10-12 LAB — CANCER ANTIGEN 27.29: CA 27.29: 8 U/mL (ref 0–39)

## 2008-10-12 LAB — VITAMIN D 25 HYDROXY (VIT D DEFICIENCY, FRACTURES): Vit D, 25-Hydroxy: 44 ng/mL (ref 30–89)

## 2008-10-13 ENCOUNTER — Encounter: Payer: Self-pay | Admitting: Family Medicine

## 2008-11-02 ENCOUNTER — Ambulatory Visit: Payer: Self-pay | Admitting: Family Medicine

## 2008-11-02 DIAGNOSIS — J301 Allergic rhinitis due to pollen: Secondary | ICD-10-CM

## 2008-11-30 ENCOUNTER — Ambulatory Visit: Payer: Self-pay | Admitting: Family Medicine

## 2008-12-03 ENCOUNTER — Encounter: Admission: RE | Admit: 2008-12-03 | Discharge: 2008-12-03 | Payer: Self-pay | Admitting: Oncology

## 2008-12-05 ENCOUNTER — Encounter: Payer: Self-pay | Admitting: Family Medicine

## 2008-12-06 ENCOUNTER — Encounter (INDEPENDENT_AMBULATORY_CARE_PROVIDER_SITE_OTHER): Payer: Self-pay | Admitting: *Deleted

## 2008-12-06 LAB — CONVERTED CEMR LAB
ALT: 26 units/L (ref 0–35)
AST: 38 units/L — ABNORMAL HIGH (ref 0–37)
Basophils Absolute: 0 10*3/uL (ref 0.0–0.1)
Basophils Relative: 0.7 % (ref 0.0–3.0)
CO2: 29 meq/L (ref 19–32)
Calcium: 9.2 mg/dL (ref 8.4–10.5)
Cholesterol: 151 mg/dL (ref 0–200)
Creatinine, Ser: 0.5 mg/dL (ref 0.4–1.2)
Glucose, Bld: 106 mg/dL — ABNORMAL HIGH (ref 70–99)
HCT: 34.6 % — ABNORMAL LOW (ref 36.0–46.0)
Hgb A1c MFr Bld: 5.2 % (ref 4.6–6.5)
Lymphocytes Relative: 23.6 % (ref 12.0–46.0)
Monocytes Relative: 4.7 % (ref 3.0–12.0)
Neutro Abs: 4.6 10*3/uL (ref 1.4–7.7)
RBC: 4.1 M/uL (ref 3.87–5.11)
RDW: 16.5 % — ABNORMAL HIGH (ref 11.5–14.6)
Sodium: 145 meq/L (ref 135–145)
Total Bilirubin: 1.2 mg/dL (ref 0.3–1.2)
Total CHOL/HDL Ratio: 5
Triglycerides: 126 mg/dL (ref 0.0–149.0)
Vit D, 25-Hydroxy: 41 ng/mL (ref 30–89)
Vitamin B-12: 448 pg/mL (ref 211–911)
WBC: 6.6 10*3/uL (ref 4.5–10.5)

## 2008-12-25 ENCOUNTER — Encounter: Payer: Self-pay | Admitting: Family Medicine

## 2008-12-28 ENCOUNTER — Ambulatory Visit: Payer: Self-pay | Admitting: Family Medicine

## 2009-01-04 ENCOUNTER — Encounter: Admission: RE | Admit: 2009-01-04 | Discharge: 2009-04-04 | Payer: Self-pay | Admitting: Family Medicine

## 2009-01-07 ENCOUNTER — Telehealth (INDEPENDENT_AMBULATORY_CARE_PROVIDER_SITE_OTHER): Payer: Self-pay | Admitting: *Deleted

## 2009-01-18 ENCOUNTER — Ambulatory Visit: Payer: Self-pay | Admitting: Family Medicine

## 2009-03-01 ENCOUNTER — Encounter: Payer: Self-pay | Admitting: Family Medicine

## 2009-03-18 ENCOUNTER — Ambulatory Visit: Payer: Self-pay | Admitting: Family Medicine

## 2009-03-20 ENCOUNTER — Telehealth: Payer: Self-pay | Admitting: Family Medicine

## 2009-03-20 LAB — CONVERTED CEMR LAB
ALT: 24 units/L (ref 0–35)
AST: 25 units/L (ref 0–37)
Albumin: 3.9 g/dL (ref 3.5–5.2)
Calcium: 9.4 mg/dL (ref 8.4–10.5)
Cholesterol: 153 mg/dL (ref 0–200)
Creatinine, Ser: 0.6 mg/dL (ref 0.4–1.2)
Eosinophils Relative: 1 % (ref 0.0–5.0)
Glucose, Bld: 91 mg/dL (ref 70–99)
HDL: 33.6 mg/dL — ABNORMAL LOW (ref >39.00)
Hemoglobin: 12.2 g/dL (ref 12.0–15.0)
Lymphs Abs: 1.9 10*3/uL (ref 0.7–4.0)
Platelets: 205 10*3/uL (ref 150.0–400.0)
Potassium: 3.1 meq/L — ABNORMAL LOW (ref 3.5–5.1)
RBC: 4.45 M/uL (ref 3.87–5.11)
Sodium: 143 meq/L (ref 135–145)
Total Bilirubin: 1.1 mg/dL (ref 0.3–1.2)
Total CHOL/HDL Ratio: 5
Total Protein: 7.5 g/dL (ref 6.0–8.3)
Triglycerides: 101 mg/dL (ref 0.0–149.0)
VLDL: 20.2 mg/dL (ref 0.0–40.0)
Vit D, 25-Hydroxy: 50 ng/mL (ref 30–89)

## 2009-03-21 ENCOUNTER — Ambulatory Visit: Payer: Self-pay | Admitting: Family Medicine

## 2009-03-25 ENCOUNTER — Telehealth: Payer: Self-pay | Admitting: Family Medicine

## 2009-04-01 ENCOUNTER — Ambulatory Visit (HOSPITAL_BASED_OUTPATIENT_CLINIC_OR_DEPARTMENT_OTHER): Admission: RE | Admit: 2009-04-01 | Discharge: 2009-04-01 | Payer: Self-pay | Admitting: Family Medicine

## 2009-04-01 ENCOUNTER — Ambulatory Visit: Payer: Self-pay | Admitting: Radiology

## 2009-04-01 ENCOUNTER — Ambulatory Visit: Payer: Self-pay | Admitting: Family Medicine

## 2009-04-01 DIAGNOSIS — R0789 Other chest pain: Secondary | ICD-10-CM | POA: Insufficient documentation

## 2009-04-01 DIAGNOSIS — M546 Pain in thoracic spine: Secondary | ICD-10-CM | POA: Insufficient documentation

## 2009-04-05 ENCOUNTER — Ambulatory Visit: Payer: Self-pay | Admitting: Family Medicine

## 2009-04-05 DIAGNOSIS — L909 Atrophic disorder of skin, unspecified: Secondary | ICD-10-CM | POA: Insufficient documentation

## 2009-04-05 DIAGNOSIS — L919 Hypertrophic disorder of the skin, unspecified: Secondary | ICD-10-CM

## 2009-04-08 ENCOUNTER — Ambulatory Visit: Payer: Self-pay | Admitting: Oncology

## 2009-04-11 ENCOUNTER — Encounter: Payer: Self-pay | Admitting: Family Medicine

## 2009-04-11 LAB — COMPREHENSIVE METABOLIC PANEL
BUN: 16 mg/dL (ref 6–23)
CO2: 26 mEq/L (ref 19–32)
Calcium: 9.5 mg/dL (ref 8.4–10.5)
Chloride: 105 mEq/L (ref 96–112)
Creatinine, Ser: 0.64 mg/dL (ref 0.40–1.20)
Glucose, Bld: 99 mg/dL (ref 70–99)
Total Bilirubin: 0.7 mg/dL (ref 0.3–1.2)

## 2009-04-11 LAB — CBC WITH DIFFERENTIAL/PLATELET
Basophils Absolute: 0 10*3/uL (ref 0.0–0.1)
Eosinophils Absolute: 0.1 10*3/uL (ref 0.0–0.5)
HCT: 36.3 % (ref 34.8–46.6)
HGB: 12 g/dL (ref 11.6–15.9)
LYMPH%: 24.8 % (ref 14.0–49.7)
MCHC: 33.2 g/dL (ref 31.5–36.0)
MONO#: 0.3 10*3/uL (ref 0.1–0.9)
NEUT#: 4.3 10*3/uL (ref 1.5–6.5)
NEUT%: 68.7 % (ref 38.4–76.8)
Platelets: 225 10*3/uL (ref 145–400)
WBC: 6.3 10*3/uL (ref 3.9–10.3)

## 2009-04-22 ENCOUNTER — Encounter (INDEPENDENT_AMBULATORY_CARE_PROVIDER_SITE_OTHER): Payer: Self-pay | Admitting: *Deleted

## 2009-04-29 ENCOUNTER — Ambulatory Visit: Payer: Self-pay | Admitting: Family Medicine

## 2009-04-29 DIAGNOSIS — R109 Unspecified abdominal pain: Secondary | ICD-10-CM | POA: Insufficient documentation

## 2009-04-29 LAB — CONVERTED CEMR LAB
Glucose, Urine, Semiquant: NEGATIVE
Ketones, urine, test strip: NEGATIVE
Protein, U semiquant: NEGATIVE
Specific Gravity, Urine: 1.01
Urobilinogen, UA: NEGATIVE

## 2009-04-30 ENCOUNTER — Telehealth: Payer: Self-pay | Admitting: Family Medicine

## 2009-04-30 ENCOUNTER — Encounter: Payer: Self-pay | Admitting: Family Medicine

## 2009-05-06 LAB — CONVERTED CEMR LAB
ALT: 21 units/L (ref 0–35)
Albumin: 3.7 g/dL (ref 3.5–5.2)
Alkaline Phosphatase: 127 units/L — ABNORMAL HIGH (ref 39–117)
Basophils Absolute: 0.1 10*3/uL (ref 0.0–0.1)
CO2: 29 meq/L (ref 19–32)
Chloride: 104 meq/L (ref 96–112)
Eosinophils Absolute: 0.1 10*3/uL (ref 0.0–0.7)
Eosinophils Relative: 1 % (ref 0.0–5.0)
HCT: 36.3 % (ref 36.0–46.0)
Hemoglobin: 12 g/dL (ref 12.0–15.0)
Hgb A1c MFr Bld: 5.2 % (ref 4.6–6.5)
MCV: 85.3 fL (ref 78.0–100.0)
Monocytes Absolute: 0.4 10*3/uL (ref 0.1–1.0)
Neutrophils Relative %: 68.5 % (ref 43.0–77.0)
Platelets: 205 10*3/uL (ref 150.0–400.0)
Potassium: 3.6 meq/L (ref 3.5–5.1)
RBC: 4.26 M/uL (ref 3.87–5.11)
RDW: 16 % — ABNORMAL HIGH (ref 11.5–14.6)
Sodium: 143 meq/L (ref 135–145)
Total Protein: 7.5 g/dL (ref 6.0–8.3)
Triglycerides: 115 mg/dL (ref 0.0–149.0)
VLDL: 23 mg/dL (ref 0.0–40.0)
Vit D, 25-Hydroxy: 49 ng/mL (ref 30–89)
WBC: 7.7 10*3/uL (ref 4.5–10.5)

## 2009-05-13 ENCOUNTER — Encounter (INDEPENDENT_AMBULATORY_CARE_PROVIDER_SITE_OTHER): Payer: Self-pay | Admitting: *Deleted

## 2009-05-15 ENCOUNTER — Telehealth: Payer: Self-pay | Admitting: Family Medicine

## 2009-05-16 ENCOUNTER — Telehealth: Payer: Self-pay | Admitting: Family Medicine

## 2009-05-16 DIAGNOSIS — R1032 Left lower quadrant pain: Secondary | ICD-10-CM

## 2009-05-21 ENCOUNTER — Encounter: Admission: RE | Admit: 2009-05-21 | Discharge: 2009-05-21 | Payer: Self-pay | Admitting: Family Medicine

## 2009-05-22 ENCOUNTER — Ambulatory Visit: Payer: Self-pay | Admitting: Cardiology

## 2009-05-22 ENCOUNTER — Telehealth (INDEPENDENT_AMBULATORY_CARE_PROVIDER_SITE_OTHER): Payer: Self-pay | Admitting: *Deleted

## 2009-06-19 ENCOUNTER — Encounter: Payer: Self-pay | Admitting: Family Medicine

## 2009-06-19 ENCOUNTER — Encounter: Admission: RE | Admit: 2009-06-19 | Discharge: 2009-09-17 | Payer: Self-pay | Admitting: Family Medicine

## 2009-07-01 ENCOUNTER — Encounter (INDEPENDENT_AMBULATORY_CARE_PROVIDER_SITE_OTHER): Payer: Self-pay | Admitting: *Deleted

## 2009-07-01 ENCOUNTER — Ambulatory Visit: Payer: Self-pay | Admitting: Family Medicine

## 2009-07-01 DIAGNOSIS — K429 Umbilical hernia without obstruction or gangrene: Secondary | ICD-10-CM | POA: Insufficient documentation

## 2009-07-08 ENCOUNTER — Telehealth: Payer: Self-pay | Admitting: Family Medicine

## 2009-07-08 LAB — CONVERTED CEMR LAB
AST: 18 units/L (ref 0–37)
Albumin: 3.8 g/dL (ref 3.5–5.2)
BUN: 12 mg/dL (ref 6–23)
Bilirubin, Direct: 0.1 mg/dL (ref 0.0–0.3)
Calcium: 9.3 mg/dL (ref 8.4–10.5)
Cholesterol: 145 mg/dL (ref 0–200)
Creatinine, Ser: 0.6 mg/dL (ref 0.4–1.2)
Creatinine,U: 70.9 mg/dL
HDL: 36.2 mg/dL — ABNORMAL LOW (ref >39.00)
Hgb A1c MFr Bld: 5.2 % (ref 4.6–6.5)
LDL Cholesterol: 94 mg/dL (ref 0–99)
Microalb Creat Ratio: 21.2 mg/g (ref 0.0–30.0)
Sodium: 145 meq/L (ref 135–145)
Triglycerides: 75 mg/dL (ref 0.0–149.0)

## 2009-07-17 ENCOUNTER — Encounter: Payer: Self-pay | Admitting: Family Medicine

## 2009-07-25 ENCOUNTER — Encounter: Admission: RE | Admit: 2009-07-25 | Discharge: 2009-07-25 | Payer: Self-pay | Admitting: Obstetrics and Gynecology

## 2009-08-02 ENCOUNTER — Encounter: Admission: RE | Admit: 2009-08-02 | Discharge: 2009-08-02 | Payer: Self-pay | Admitting: Obstetrics and Gynecology

## 2009-08-12 ENCOUNTER — Ambulatory Visit: Payer: Self-pay | Admitting: Family Medicine

## 2009-08-12 DIAGNOSIS — J019 Acute sinusitis, unspecified: Secondary | ICD-10-CM

## 2009-09-18 ENCOUNTER — Encounter: Admission: RE | Admit: 2009-09-18 | Discharge: 2009-09-18 | Payer: Self-pay | Admitting: Family Medicine

## 2009-09-30 ENCOUNTER — Ambulatory Visit: Payer: Self-pay | Admitting: Family Medicine

## 2009-10-07 ENCOUNTER — Encounter: Admission: RE | Admit: 2009-10-07 | Discharge: 2009-10-07 | Payer: Self-pay | Admitting: Oncology

## 2009-10-23 LAB — CONVERTED CEMR LAB: Pap Smear: NORMAL

## 2009-11-06 ENCOUNTER — Ambulatory Visit: Payer: Self-pay | Admitting: Oncology

## 2009-11-07 ENCOUNTER — Encounter: Payer: Self-pay | Admitting: Family Medicine

## 2009-11-07 LAB — CBC WITH DIFFERENTIAL/PLATELET
BASO%: 0.3 % (ref 0.0–2.0)
Eosinophils Absolute: 0.1 10*3/uL (ref 0.0–0.5)
HCT: 37.1 % (ref 34.8–46.6)
HGB: 12.4 g/dL (ref 11.6–15.9)
LYMPH%: 26 % (ref 14.0–49.7)
MCHC: 33.5 g/dL (ref 31.5–36.0)
MONO#: 0.3 10*3/uL (ref 0.1–0.9)
NEUT#: 4.5 10*3/uL (ref 1.5–6.5)
NEUT%: 67.9 % (ref 38.4–76.8)
Platelets: 209 10*3/uL (ref 145–400)
WBC: 6.6 10*3/uL (ref 3.9–10.3)
lymph#: 1.7 10*3/uL (ref 0.9–3.3)

## 2009-11-07 LAB — CANCER ANTIGEN 27.29: CA 27.29: 8 U/mL (ref 0–39)

## 2009-11-07 LAB — VITAMIN D 25 HYDROXY (VIT D DEFICIENCY, FRACTURES): Vit D, 25-Hydroxy: 46 ng/mL (ref 30–89)

## 2009-11-07 LAB — COMPREHENSIVE METABOLIC PANEL
ALT: 18 U/L (ref 0–35)
CO2: 24 mEq/L (ref 19–32)
Calcium: 9.1 mg/dL (ref 8.4–10.5)
Chloride: 103 mEq/L (ref 96–112)
Creatinine, Ser: 0.54 mg/dL (ref 0.40–1.20)
Glucose, Bld: 94 mg/dL (ref 70–99)
Total Protein: 6.8 g/dL (ref 6.0–8.3)

## 2009-12-05 ENCOUNTER — Encounter: Admission: RE | Admit: 2009-12-05 | Discharge: 2009-12-05 | Payer: Self-pay | Admitting: Oncology

## 2009-12-18 ENCOUNTER — Encounter: Admission: RE | Admit: 2009-12-18 | Discharge: 2009-12-18 | Payer: Self-pay | Admitting: Family Medicine

## 2009-12-18 ENCOUNTER — Encounter: Payer: Self-pay | Admitting: Family Medicine

## 2009-12-23 ENCOUNTER — Ambulatory Visit: Payer: Self-pay | Admitting: Family Medicine

## 2009-12-25 LAB — CONVERTED CEMR LAB: Vit D, 25-Hydroxy: 43 ng/mL (ref 30–89)

## 2010-01-06 ENCOUNTER — Ambulatory Visit: Payer: Self-pay | Admitting: Family Medicine

## 2010-01-07 LAB — CONVERTED CEMR LAB
AST: 18 units/L (ref 0–37)
Albumin: 3.8 g/dL (ref 3.5–5.2)
Alkaline Phosphatase: 113 units/L (ref 39–117)
Basophils Absolute: 0 10*3/uL (ref 0.0–0.1)
CO2: 29 meq/L (ref 19–32)
Calcium: 9.4 mg/dL (ref 8.4–10.5)
Creatinine, Ser: 0.6 mg/dL (ref 0.4–1.2)
Eosinophils Absolute: 0.1 10*3/uL (ref 0.0–0.7)
Glucose, Bld: 92 mg/dL (ref 70–99)
Lymphocytes Relative: 27.3 % (ref 12.0–46.0)
MCHC: 33.3 g/dL (ref 30.0–36.0)
Neutrophils Relative %: 66.5 % (ref 43.0–77.0)
RDW: 16 % — ABNORMAL HIGH (ref 11.5–14.6)
Total Bilirubin: 0.8 mg/dL (ref 0.3–1.2)
Total CHOL/HDL Ratio: 4
Triglycerides: 107 mg/dL (ref 0.0–149.0)

## 2010-03-11 ENCOUNTER — Ambulatory Visit: Payer: Self-pay | Admitting: Family Medicine

## 2010-03-20 ENCOUNTER — Encounter
Admission: RE | Admit: 2010-03-20 | Discharge: 2010-03-20 | Payer: Self-pay | Source: Home / Self Care | Attending: Family Medicine | Admitting: Family Medicine

## 2010-03-20 ENCOUNTER — Encounter: Payer: Self-pay | Admitting: Family Medicine

## 2010-05-15 ENCOUNTER — Ambulatory Visit: Payer: Self-pay | Admitting: Family Medicine

## 2010-05-15 ENCOUNTER — Encounter: Payer: Self-pay | Admitting: Gastroenterology

## 2010-05-15 ENCOUNTER — Encounter: Payer: Self-pay | Admitting: Family Medicine

## 2010-05-15 DIAGNOSIS — D126 Benign neoplasm of colon, unspecified: Secondary | ICD-10-CM

## 2010-05-15 LAB — CONVERTED CEMR LAB
Bilirubin Urine: NEGATIVE
Glucose, Urine, Semiquant: NEGATIVE
pH: 5

## 2010-05-19 ENCOUNTER — Telehealth (INDEPENDENT_AMBULATORY_CARE_PROVIDER_SITE_OTHER): Payer: Self-pay | Admitting: *Deleted

## 2010-05-19 LAB — CONVERTED CEMR LAB
ALT: 13 units/L (ref 0–35)
AST: 20 units/L (ref 0–37)
Albumin: 3.8 g/dL (ref 3.5–5.2)
Basophils Absolute: 0 10*3/uL (ref 0.0–0.1)
Basophils Relative: 0.5 % (ref 0.0–3.0)
Eosinophils Relative: 0.8 % (ref 0.0–5.0)
GFR calc non Af Amer: 136.38 mL/min (ref >60)
Glucose, Bld: 86 mg/dL (ref 70–99)
HCT: 36.5 % (ref 36.0–46.0)
Hemoglobin: 12.2 g/dL (ref 12.0–15.0)
Lymphs Abs: 2 10*3/uL (ref 0.7–4.0)
Monocytes Relative: 5.2 % (ref 3.0–12.0)
Neutro Abs: 5.3 10*3/uL (ref 1.4–7.7)
Potassium: 4.8 meq/L (ref 3.5–5.1)
RDW: 15.6 % — ABNORMAL HIGH (ref 11.5–14.6)
Sodium: 140 meq/L (ref 135–145)
TSH: 4.08 microintl units/mL (ref 0.35–5.50)
Total Protein: 6.8 g/dL (ref 6.0–8.3)
VLDL: 21.4 mg/dL (ref 0.0–40.0)

## 2010-05-27 ENCOUNTER — Telehealth: Payer: Self-pay | Admitting: Family Medicine

## 2010-06-12 ENCOUNTER — Encounter (INDEPENDENT_AMBULATORY_CARE_PROVIDER_SITE_OTHER): Payer: Self-pay | Admitting: *Deleted

## 2010-06-13 ENCOUNTER — Ambulatory Visit: Payer: Self-pay | Admitting: Internal Medicine

## 2010-06-15 HISTORY — PX: COLONOSCOPY: SHX174

## 2010-06-27 ENCOUNTER — Ambulatory Visit
Admission: RE | Admit: 2010-06-27 | Discharge: 2010-06-27 | Payer: Self-pay | Source: Home / Self Care | Attending: Internal Medicine | Admitting: Internal Medicine

## 2010-06-27 ENCOUNTER — Encounter: Payer: Self-pay | Admitting: Internal Medicine

## 2010-06-27 LAB — HM COLONOSCOPY

## 2010-07-01 ENCOUNTER — Encounter: Payer: Self-pay | Admitting: Internal Medicine

## 2010-07-06 ENCOUNTER — Encounter: Payer: Self-pay | Admitting: Family Medicine

## 2010-07-06 ENCOUNTER — Encounter: Payer: Self-pay | Admitting: Endocrinology

## 2010-07-13 LAB — CONVERTED CEMR LAB
ALT: 26 units/L (ref 0–35)
AST: 26 units/L (ref 0–37)
AST: 30 units/L (ref 0–37)
Albumin: 3.5 g/dL (ref 3.5–5.2)
Albumin: 3.6 g/dL (ref 3.5–5.2)
Alkaline Phosphatase: 111 units/L (ref 39–117)
Alkaline Phosphatase: 120 units/L — ABNORMAL HIGH (ref 39–117)
Basophils Absolute: 0 10*3/uL (ref 0.0–0.1)
Basophils Relative: 0.6 % (ref 0.0–3.0)
Bilirubin, Direct: 0.1 mg/dL (ref 0.0–0.3)
CO2: 29 meq/L (ref 19–32)
CO2: 31 meq/L (ref 19–32)
Calcium: 9.5 mg/dL (ref 8.4–10.5)
Creatinine, Ser: 0.7 mg/dL (ref 0.4–1.2)
Direct LDL: 153.5 mg/dL
Eosinophils Relative: 1.1 % (ref 0.0–5.0)
Folate: >20 ng/mL
GFR calc Af Amer: 132 mL/min
GFR calc non Af Amer: 109 mL/min
Glucose, Urine, Semiquant: NEGATIVE
HDL: 28.1 mg/dL — ABNORMAL LOW (ref >39.0)
HDL: 28.8 mg/dL — ABNORMAL LOW (ref >39.0)
Hgb A1c MFr Bld: 5.3 % (ref 4.6–6.0)
Ketones, urine, test strip: NEGATIVE
MCHC: 33.3 g/dL (ref 30.0–36.0)
MCV: 84.4 fL (ref 78.0–100.0)
Monocytes Relative: 5.1 % (ref 3.0–12.0)
Neutrophils Relative %: 72.1 % (ref 43.0–77.0)
Pap Smear: NORMAL
Platelets: 209 10*3/uL (ref 150–400)
Potassium: 3.9 meq/L (ref 3.5–5.1)
Potassium: 4.2 meq/L (ref 3.5–5.1)
Protein, U semiquant: 30
RDW: 15.9 % — ABNORMAL HIGH (ref 11.5–14.6)
Sodium: 143 meq/L (ref 135–145)
Sodium: 147 meq/L — ABNORMAL HIGH (ref 135–145)
Specific Gravity, Urine: 1.02
Specific Gravity, Urine: 1.025
Total Bilirubin: 0.9 mg/dL (ref 0.3–1.2)
Total Bilirubin: 1 mg/dL (ref 0.3–1.2)
Total CHOL/HDL Ratio: 7.2
Total Protein: 7.1 g/dL (ref 6.0–8.3)
Triglycerides: 108 mg/dL (ref 0–149)
Triglycerides: 145 mg/dL (ref 0–149)
Urobilinogen, UA: NEGATIVE
Urobilinogen, UA: NEGATIVE
VLDL: 29 mg/dL (ref 0–40)
Vitamin B-12: 328 pg/mL (ref 211–911)
WBC: 7.7 10*3/uL (ref 4.5–10.5)
pH: 6

## 2010-07-15 NOTE — Progress Notes (Signed)
Summary: New RX       New/Updated Medications: LIPITOR 20 MG TABS (ATORVASTATIN CALCIUM) 1 by mouth at bedtime Prescriptions: LIPITOR 20 MG TABS (ATORVASTATIN CALCIUM) 1 by mouth at bedtime  #30 x 2   Entered by:   Almeta Monas CMA (AAMA)   Authorized by:   Loreen Freud DO   Signed by:   Almeta Monas CMA (AAMA) on 05/19/2010   Method used:   Faxed to ...       [facility]  [address] # [id number]* (retail)       [address]       [place], [place]  [address]       Ph: [phone] or [phone]       Fax: [phone]   RxID:   [id number]
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
## 2010-07-15 NOTE — Letter (Signed)
Summary: [facility]   Imported By: Lanelle Bal 07/10/2009 12:29:00  _____________________________________________________________________  External Attachment:    Type:   Image     Comment:   External Document
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
## 2010-07-15 NOTE — Consult Note (Signed)
Summary: [facility]   Imported By: Lanelle Bal 08/06/2009 11:26:47  _____________________________________________________________________  External Attachment:    Type:   Image     Comment:   External Document

## 2010-07-15 NOTE — Letter (Signed)
Summary: Primary Care Consult Scheduled Letter  [facility] [place]  [address] [place], [place] [address]   Phone: [phone]  Fax: [phone]      07/01/2009 MRN: [id number]  [facility] [address] [place], [place]  [address]    Dear Ms. Mcdanel,    We have scheduled an appointment for you.  At the recommendation of Dr. Loreen Freud, we have scheduled you a consult with Dr. Violeta Gelinas with [facility] on 07-17-2009 arrive by 10:20am.  Their address is [address], [place] [address]. The office phone number is [phone].  If this appointment day and time is not convenient for you, please feel free to call the office of the doctor you are being referred to at the number listed above and reschedule the appointment.    It is important for you to keep your scheduled appointments. We are here to make sure you are given good patient care.   Thank you,    Renee, Patient Care Coordinator [place] [facility]

## 2010-07-15 NOTE — Letter (Signed)
Summary: [facility]   Imported By: Lanelle Bal 01/22/2010 09:24:06  _____________________________________________________________________  External Attachment:    Type:   Image     Comment:   External Document

## 2010-07-15 NOTE — Assessment & Plan Note (Signed)
Summary: FLU SHOT/KN   Nurse Visit  CC: Flu shot./kb   Allergies: 1)  ! Codeine 2)  ! Asa 3)  ! Lovaza (Omega-3-Acid Ethyl Esters)  Orders Added: 1)  Flu Vaccine 29yrs + MEDICARE PATIENTS [Q2039] 2)  Administration Flu vaccine - MCR [G0008]            Flu Vaccine Consent Questions     Do you have a history of severe allergic reactions to this vaccine? no    Any prior history of allergic reactions to egg and/or gelatin? no    Do you have a sensitivity to the preservative Thimersol? no    Do you have a past history of Guillan-Barre Syndrome? no    Do you currently have an acute febrile illness? no    Have you ever had a severe reaction to latex? no    Vaccine information given and explained to patient? yes    Are you currently pregnant? no    Lot Number:[id number]   Exp Date:12/13/2010   Site Given  Right Deltoid IMu

## 2010-07-15 NOTE — Progress Notes (Signed)
Summary: lab results  Phone Note Outgoing Call   Call placed by: [facility] CMA,  July 08, 2009 10:00 AM Summary of Call: pt states that she is unable to take  lovaza 1 g  2 by mouth two times a day due to face swelling. per pt she informed us of this when med was started, (see phone note dated 01-07-09). pt states that she is doing OTC fish oil.pt aware of lab results ............Marland KitchenFelecia Deloach CMA  July 08, 2009 10:01 AM   overall good!   HDL low---con't meds----Is pt taking lovaza 1 g  2 by mouth two times a day ? recheck 6 months----272.4  250.00  hgba1c, bmp, hep, lipid, microalbumin  Follow-up for Phone Call        ok  Follow-up by: Loreen Freud DO,  July 08, 2009 10:10 AM   New Allergies: ! LOVAZA (OMEGA-3-ACID ETHYL ESTERS) New Allergies: ! LOVAZA (OMEGA-3-ACID ETHYL ESTERS)
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
## 2010-07-15 NOTE — Letter (Signed)
Summary: Pre Visit Letter Revised  [facility]  [address] [place], [place] [address]   Phone: [phone]  Fax: [phone]        05/15/2010 MRN: [id number]  [facility] [address] [place] [place], [place]  [address]             Procedure Date:  06-27-10 10am           Dr Juanda Chance  Recall Colon   Welcome to the Gastroenterology Division at [facility].    You are scheduled to see a nurse for your pre-procedure visit on 06-13-10 at 10:30am on the 3rd floor at [facility], [address]  We ask that you try to arrive at our office 15 minutes prior to your appointment time to allow for check-in.  Please take a minute to review the attached form.  If you answer "Yes" to one or more of the questions on the first page, we ask that you call the person listed at your earliest opportunity.  If you answer "No" to all of the questions, please complete the rest of the form and bring it to your appointment.    Your nurse visit will consist of discussing your medical and surgical history, your immediate family medical history, and your medications.   If you are unable to list all of your medications on the form, please bring the medication bottles to your appointment and we will list them.  We will need to be aware of both prescribed and over the counter drugs.  We will need to know exact dosage information as well.    Please be prepared to read and sign documents such as consent forms, a financial agreement, and acknowledgement forms.  If necessary, and with your consent, a friend or relative is welcome to sit-in on the nurse visit with you.  Please bring your insurance card so that we may make a copy of it.  If your insurance requires a referral to see a specialist, please bring your referral form from your primary care physician.  No co-pay is required for this nurse visit.     If you cannot keep your appointment, please call [phone] to cancel or reschedule  prior to your appointment date.  This allows [place] the opportunity to schedule an appointment for another patient in need of care.    Thank you for choosing [facility] for your medical needs.  We appreciate the opportunity to care for you.  Please visit [place] at our website  to learn more about our practice.  Sincerely, The Gastroenterology Division
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
## 2010-07-15 NOTE — Letter (Signed)
Summary: [facility]  [facility]   Imported By: Lanelle Bal 03/27/2010 14:23:33  _____________________________________________________________________  External Attachment:    Type:   Image     Comment:   External Document

## 2010-07-17 NOTE — Letter (Signed)
Summary: Miralax Instructions  [facility]  [address]   [place], [place] [address]   Phone: [phone]  Fax: [phone]       Kelly [name]    [birth year]    MRN: [id number]       Procedure Day Dorna Bloom: Friday, 06-27-10     Arrival Time: 9:00 a.m.     Procedure Time: 10:00 a.m.     Location of Procedure:                    x   [facility] (4th Floor)   PREPARATION FOR COLONOSCOPY WITH MIRALAX  Starting 5 days prior to your procedure 06-22-10 do not eat nuts, seeds, popcorn, corn, beans, peas,  salads, or any raw vegetables.  Do not take any fiber supplements (e.g. Metamucil, Citrucel, and Benefiber). ____________________________________________________________________________________________________   THE DAY BEFORE YOUR PROCEDURE         DATE: 06-26-10 DAY: Thursday  1   Drink clear liquids the entire day-NO SOLID FOOD  2   Do not drink anything colored red or purple.  Avoid juices with pulp.  No orange juice.  3   Drink at least 64 oz. (8 glasses) of fluid/clear liquids during the day to prevent dehydration and help the prep work efficiently.  CLEAR LIQUIDS INCLUDE: Water Jello Ice Popsicles Tea (sugar ok, no milk/cream) Powdered fruit flavored drinks Coffee (sugar ok, no milk/cream) Gatorade Juice: apple, white grape, white cranberry  Lemonade Clear bullion, consomm, broth Carbonated beverages (any kind) Strained chicken noodle soup Hard Candy  4   Mix the entire bottle of Miralax with 64 oz. of Gatorade/Powerade in the morning and put in the refrigerator to chill.  5   At 3:00 pm take 2 Dulcolax/Bisacodyl tablets.  6   At 4:30 pm take one Reglan/Metoclopramide tablet.  7  Starting at 5:00 pm drink one 8 oz glass of the Miralax mixture every 15-20 minutes until you have finished drinking the entire 64 oz.  You should finish drinking prep around 7:30 or 8:00 pm.  8   If you are nauseated, you may take the 2nd Reglan/Metoclopramide  tablet at 6:30 pm.        9    At 8:00 pm take 2 more DULCOLAX/Bisacodyl tablets.  THE DAY OF YOUR PROCEDURE      DATE: 06-27-10  DAY: Friday  You may drink clear liquids until  8:00 a.m.  (2 HOURS BEFORE PROCEDURE).   MEDICATION INSTRUCTIONS  Unless otherwise instructed, you should take regular prescription medications with a small sip of water as early as possible the morning of your procedure.    Additional medication instructions:   Hold furosemide day of procedure only.         OTHER INSTRUCTIONS  You will need a responsible adult at least 62 years of age to accompany you and drive you home.   This person must remain in the waiting room during your procedure.  Wear loose fitting clothing that is easily removed.  Leave jewelry and other valuables at home.  However, you may wish to bring a book to read or an iPod/MP3 player to listen to music as you wait for your procedure to start.  Remove all body piercing jewelry and leave at home.  Total time from sign-in until discharge is approximately 2-3 hours.  You should go home directly after your procedure and rest.  You can resume normal activities the day after your procedure.  The day of  your procedure you should not:   Drive   Make legal decisions   Operate machinery   Drink alcohol   Return to work  You will receive specific instructions about eating, activities and medications before you leave.   The above instructions have been reviewed and explained to me by   Sherren Kerns RN  June 13, 2010 10:50 AM    I fully understand and can verbalize these instructions _____________________________ Date _______

## 2010-07-17 NOTE — Miscellaneous (Signed)
Summary: previsit prep/rm  Clinical Lists Changes  Medications: Added new medication of MIRALAX   POWD (POLYETHYLENE GLYCOL 3350) As per prep  instructions. - Signed Added new medication of DULCOLAX 5 MG  TBEC (BISACODYL) Day before procedure take 2 at 3pm and 2 at 8pm. - Signed Added new medication of REGLAN 10 MG  TABS (METOCLOPRAMIDE HCL) As per prep instructions. - Signed Rx of MIRALAX   POWD (POLYETHYLENE GLYCOL 3350) As per prep  instructions.;  #255gm x 0;  Signed;  Entered by: Sherren Kerns RN;  Authorized by: Hart Carwin MD;  Method used: Electronically to [facility] [id number]*, [address] [place], [place], [place]  [address], Ph: [phone] or [phone], Fax: [phone] Rx of DULCOLAX 5 MG  TBEC (BISACODYL) Day before procedure take 2 at 3pm and 2 at 8pm.;  #4 x 0;  Signed;  Entered by: Sherren Kerns RN;  Authorized by: Hart Carwin MD;  Method used: Electronically to [facility] # [id number]*, [address] [place], [place], [place]  [address], Ph: [phone] or [phone], Fax: [phone] Rx of REGLAN 10 MG  TABS (METOCLOPRAMIDE HCL) As per prep instructions.;  #2 x 0;  Signed;  Entered by: Sherren Kerns RN;  Authorized by: Hart Carwin MD;  Method used: Electronically to [facility] [address] # [id number]*, [address] [place], [place], [place]  [address], Ph: [phone] or [phone], Fax: [phone] Observations: Added new observation of ALLERGY REV: Done (06/13/2010 10:16)    Prescriptions: REGLAN 10 MG  TABS (METOCLOPRAMIDE HCL) As per prep instructions.  #2 x 0   Entered by:   Sherren Kerns RN   Authorized by:   Hart Carwin MD   Signed by:   Sherren Kerns RN on 06/13/2010   Method used:   Electronically to        [id number]* (retail)       [address]       [place], [place]  [address]       Ph: [phone] or [phone]       Fax: [phone]   RxID:   [id number] DULCOLAX 5 MG   TBEC (BISACODYL) Day before procedure take 2 at 3pm and 2 at 8pm.  #4 x 0   Entered by:   Sherren Kerns RN   Authorized by:   Hart Carwin MD   Signed by:   Sherren Kerns RN on 06/13/2010   Method used:   Electronically to        [id number]* (retail)       [address]       [place], [place]  [address]       Ph: [phone] or [phone]       Fax: [phone]   RxID:   [id number] MIRALAX   POWD (POLYETHYLENE GLYCOL 3350) As per prep  instructions.  #255gm x 0   Entered by:   Sherren Kerns RN   Authorized by:   Hart Carwin MD   Signed by:   Sherren Kerns RN on 06/13/2010   Method used:   Electronically to        [id number]* (retail)       [address]       [facility]  [place], [place]  [address]       Ph: [phone] or [phone]       Fax: [phone]   RxID:   [id number]

## 2010-07-17 NOTE — Letter (Signed)
Summary: Patient Notice- Polyp Results  [facility]  [address] [place], [place] [address]   Phone: [phone]  Fax: [phone]        July 01, 2010 MRN: [id number]    [facility] [address] [place], [place]  [address]    Dear Ms. Millirons,  I am pleased to inform you that the colon polyp(s) removed during your recent colonoscopy was (were) found to be benign (no cancer detected) upon pathologic examination.The polyp was hyperplastic ( not precancerous)  I recommend you have a repeat colonoscopy examination in 10-_ years to look for recurrent polyps, as having colon polyps increases your risk for having recurrent polyps or even colon cancer in the future.  Should you develop new or worsening symptoms of abdominal pain, bowel habit changes or bleeding from the rectum or bowels, please schedule an evaluation with either your primary care physician or with me.  Additional information/recommendations:  __ No further action with gastroenterology is needed at this time. Please      follow-up with your primary care physician for your other healthcare      needs.  __ Please call [phone] to schedule a return visit to review your      situation.  __ Please keep your follow-up visit as already scheduled.  __ Continue treatment plan as outlined the day of your exam.  Please call us if you are having persistent problems or have questions about your condition that have not been fully answered at this time.  Sincerely,  Hart Carwin MD  This letter has been electronically signed by your physician.  Appended Document: Patient Notice- Polyp Results Letter mailed

## 2010-07-17 NOTE — Progress Notes (Signed)
Summary: Doxipen refill  Phone Note Refill Request Call back at Home Phone [phone] Message from:  Patient on May 27, 2010 10:12 AM  Refills Requested: Medication #1:  Doxipen says she needs it to sleep--please call [facility] on [address], [place]  Next Appointment Scheduled: lab=08/27/2010 Initial call taken by: Jerolyn Shin,  May 27, 2010 10:13 AM  Follow-up for Phone Call        last OV 12.-1-11, last filled 08-30-08 #90 3 remove from med list 05-15-10........Marland KitchenFelecia Deloach CMA  May 27, 2010 10:39 AM   Additional Follow-up for Phone Call Additional follow up Details #1::        Ok to refill x1 Additional Follow-up by: Loreen Freud DO,  May 27, 2010 10:48 AM    Additional Follow-up for Phone Call Additional follow up Details #2::    Pt aware Rx sent to the pharmacy..... Almeta Monas CMA Duncan Dull)  May 27, 2010 11:31 AM   New/Updated Medications: DOXEPIN HCL 25 MG CAPS (DOXEPIN HCL) 1 by mouth at bedtime Prescriptions: DOXEPIN HCL 25 MG CAPS (DOXEPIN HCL) 1 by mouth at bedtime  #30 x 0   Entered by:   Almeta Monas CMA (AAMA)   Authorized by:   Loreen Freud DO   Signed by:   Almeta Monas CMA (AAMA) on 05/27/2010   Method used:   Faxed to ...       [facility]  [address] # [id number]* (retail)       [address]       [place], [place]  [address]       Ph: [phone] or [phone]       Fax: [phone]   RxID:   [id number]

## 2010-07-17 NOTE — Procedures (Signed)
Summary: Colonoscopy  Patient: Kelly [name] Note: All result statuses are Final unless otherwise noted.  Tests: (1) Colonoscopy (COL)   COL Colonoscopy           DONE (C)     [facility]     [address]     [place], [place]  [address]           COLONOSCOPY PROCEDURE REPORT           PATIENT:  [name], [name]  MR#:  [id number]     BIRTHDATE:  1951-06-06, 59 yrs. old  GENDER:  female     ENDOSCOPIST:  Hedwig Morton. Juanda Chance, MD     REF. BY:  Loreen Freud, DO     PROCEDURE DATE:  06/27/2010     PROCEDURE:  Colonoscopy 03474     ASA CLASS:  Class II     INDICATIONS:  history of polyps colon 2003, hyperplastic,     colon ca in MGF     MEDICATIONS:   Versed 8 mg, Fentanyl 75 mcg, Benadryl 12.5 mg IV           DESCRIPTION OF PROCEDURE:   After the risks benefits and     alternatives of the procedure were thoroughly explained, informed     consent was obtained.  Digital rectal exam was performed and     revealed no rectal masses.   The [id number] endoscope was     introduced through the anus and advanced to the cecum, which was     identified by both the appendix and ileocecal valve, without     limitations.  The quality of the prep was Miralax fair.  The     instrument was then slowly withdrawn as the colon was fully     examined.     <<PROCEDUREIMAGES>>           FINDINGS:  A diminutive polyp was found. 3 mm polyp at 20 cm The     polyp was removed using cold biopsy forceps (see image3).     Otherwise normal colonoscopy without other polyps, masses, vascular     ectasias, or inflammatory changes (see image4 and image1).     Retroflexed views in the rectum revealed no abnormalities.    The     scope was then withdrawn from the patient and the procedure     completed.           COMPLICATIONS:  None     ENDOSCOPIC IMPRESSION:     1) Diminutive polyp     2) Otherwise nl colonoscopy WMO     RECOMMENDATIONS:     1) Await pathology results     2) high fiber diet     REPEAT  EXAM:  In 10 year(s) for.           ______________________________     Hedwig Morton. Juanda Chance, MD           CC:  Loreen Freud, DO           n.     REVISED:  06/27/2010 12:53 PM     eSIGNED:   Hedwig Morton. Adesuwa Osgood at 06/27/2010 12:53 PM           Colleen Can, [phone]  Note: An exclamation mark (!) indicates a result that was not dispersed into the flowsheet. Document Creation Date: 06/27/2010 12:54 PM _______________________________________________________________________  (1) Order result status: Final Collection or observation date-time: 06/27/2010 10:23 Requested date-time:  Receipt date-time:  Reported date-time:  Referring Physician:   Ordering Physician: Lina Sar [id number]) Specimen Source:  Source: Launa Grill Order Number: [id number] Lab site:   Appended Document: Colonoscopy     Procedures Next Due Date:    Colonoscopy: 06/2020

## 2010-07-18 NOTE — Letter (Signed)
[facility]   Imported By: Lanelle Bal 11/30/2009 11:13:37  _____________________________________________________________________  External Attachment:    Type:   Image     Comment:   External Document

## 2010-08-14 ENCOUNTER — Encounter: Admit: 2010-08-14 | Payer: Self-pay | Admitting: Family Medicine

## 2010-08-14 ENCOUNTER — Ambulatory Visit: Payer: Self-pay | Admitting: *Deleted

## 2010-08-27 ENCOUNTER — Encounter (INDEPENDENT_AMBULATORY_CARE_PROVIDER_SITE_OTHER): Payer: Self-pay | Admitting: *Deleted

## 2010-08-27 ENCOUNTER — Other Ambulatory Visit (INDEPENDENT_AMBULATORY_CARE_PROVIDER_SITE_OTHER): Payer: Medicare Other

## 2010-08-27 ENCOUNTER — Other Ambulatory Visit: Payer: Self-pay | Admitting: Family Medicine

## 2010-08-27 DIAGNOSIS — E785 Hyperlipidemia, unspecified: Secondary | ICD-10-CM

## 2010-08-27 LAB — HEPATIC FUNCTION PANEL
Albumin: 3.9 g/dL (ref 3.5–5.2)
Bilirubin, Direct: 0.1 mg/dL (ref 0.0–0.3)
Total Protein: 7 g/dL (ref 6.0–8.3)

## 2010-08-27 LAB — LIPID PANEL
HDL: 37.8 mg/dL — ABNORMAL LOW (ref >39.00)
Triglycerides: 113 mg/dL (ref 0.0–149.0)
VLDL: 22.6 mg/dL (ref 0.0–40.0)

## 2010-09-16 ENCOUNTER — Encounter: Payer: Medicare Other | Attending: Family Medicine | Admitting: *Deleted

## 2010-09-16 DIAGNOSIS — Z713 Dietary counseling and surveillance: Secondary | ICD-10-CM | POA: Insufficient documentation

## 2010-09-26 ENCOUNTER — Other Ambulatory Visit: Payer: Self-pay | Admitting: Family Medicine

## 2010-10-27 ENCOUNTER — Other Ambulatory Visit: Payer: Self-pay | Admitting: Family Medicine

## 2010-10-27 DIAGNOSIS — Z9889 Other specified postprocedural states: Secondary | ICD-10-CM

## 2010-10-28 ENCOUNTER — Encounter: Payer: Medicare Other | Attending: Family Medicine | Admitting: *Deleted

## 2010-10-28 DIAGNOSIS — Z713 Dietary counseling and surveillance: Secondary | ICD-10-CM | POA: Insufficient documentation

## 2010-10-31 NOTE — Letter (Signed)
December 30, 2005     Kelly [name] Management      RE:  Kelly [name], Kelly [name]  MRN:  [id number]  /  DOB:  [birth year]   To Whom It May Concern:   [name] has been a patient in this practice for several years.  Because  of worsening medical problems the patient sometimes needs to use a  wheelchair and is unable to walk long distances.  Thus it would be important  that the patient have a parking space in front of her apartment so she does  not have to walk or use the wheelchair for long distance of time.  Please  feel free to call us with any further questions.    Sincerely,      Lelon Perla, DO   YRL/MedQ  DD:  12/30/2005  DT:  12/30/2005  Job #:  [id number]

## 2010-10-31 NOTE — Op Note (Signed)
Kelly [name], Kelly [name]               ACCOUNT NO.:  [id number]   MEDICAL RECORD NO.:  [id number]          PATIENT TYPE:  AMB   LOCATION:  DSC                          FACILITY:  [facility]   PHYSICIAN:  Gabrielle Dare. Janee Morn, M.D.DATE OF BIRTH:  21-May-1951   DATE OF PROCEDURE:  10/30/2004  DATE OF DISCHARGE:                                 OPERATIVE REPORT   PREOPERATIVE DIAGNOSIS:  Left breast cancer.   POSTOPERATIVE DIAGNOSIS:  Left breast cancer.   PROCEDURE:  Left lumpectomy with sentinel lymph node biopsy.   SURGEON:  Gabrielle Dare. Janee Morn, M.D.   ANESTHESIA:  General.   HISTORY OF PRESENT ILLNESS:  The patient is a 60 year old female who was  noted to have a suspicious mass in her left breast around 2 o'clock on  mammogram.  Subsequent ultrasound guided biopsies were done; and it showed  invasive mammary carcinoma, likely a ductal carcinoma. She presents for  elective left lumpectomy and sentinel lymph node biopsy. The patient has  selected breast conservation therapy and agrees to undergo followup  radiation treatments.   PROCEDURE IN DETAIL:  Informed consent was obtained.  The patient was  identified, her left side was marked.  She had received a nuclear medicine  injection 30 minutes prior to being in the preop area. She was brought to  the operating room and general anesthesia with LMA was administered. Her  left axilla, breast, and chest were prepped and draped in a sterile fashion.  Prior to prepping and draping, I did inject methylene blue 2 mL mixed with 3  mL of sterile injectable saline in the periareolar tissues. This was  massaged for 5 minutes prior to the prep.  Continuing, after prepping and  draping in a sterile fashion, attention was first directed to the sentinel  lymph node biopsy.   The NeoProbe was used to select an area of high signal.  A transverse  incision was made over the axilla.  Subcutaneous tissues were dissected down  and, using the NeoProbe as  guidance, a blue node was located. This was  circumferentially dissected and then removed. It was checked with the  NeoProbe and the counts were 1500. This was sent to pathology. It was indeed  blue tinted.  Further check of the in the axillary incision, noted some  residual high counts in the 1000 range.  Further dissection revealed another  blue node that was hot.  This was circumferentially dissected and removed.  This was again checked with the NeoProbe and read out about 1800.  The  NeoProbe was reinserted into the wound and no counts over 80 were noted. The  wound was irrigated and a small sponge was placed partially in the wound.   Attention was directed to the breast.  A radial linear incision was made  over the area. The breast mass was palpable around 2 o'clock. There was also  a biopsy that was healing near that area. Subcutaneous tissues were  dissected down.  A  wide lumpectomy was performed with wide margins by  palpation medial and lateral to the mass; and this was carried  down to the  pectoralis fascia.  Further circumferential dissection with Bovie cautery  was accomplished;  it continued to use the palpation technique to stay over  a centimeter away from the mass; and it was removed in one piece. It was  marked in 6 areas with the marking system; and sent to pathology.  In the  interim, the touch preps from the sentinel nodes came back negative.  Subsequently both wounds were copiously irrigated. Subcutaneous tissues of  each were closed with interrupted 3-0 Vicryl sutures and after insuring  excellent hemostasis; the skin of each was closed with a running 4-0  Monocryl subcuticular stitch.   We then waited and we had word from pathology that the touch preps on the  lumpectomy specimen were all negative as well.  Sponge, needle, and  instrument counts were correct. Benzoin, Steri-Strips, and sterile dressings  were applied. The patient tolerated procedure well without  apparent  complication and was taken recovery in stable condition.      BET/MEDQ  D:  10/30/2004  T:  10/30/2004  Job:  [id number]

## 2010-10-31 NOTE — Discharge Summary (Signed)
NAME:  Kelly [name], Kelly [name]                         ACCOUNT NO.:  [id number]   MEDICAL RECORD NO.:  [id number]                   PATIENT TYPE:  INP   LOCATION:  5031                                 FACILITY:  [facility]   PHYSICIAN:  Jamelle Rushing, P.A.                DATE OF BIRTH:  06/06/1951   DATE OF ADMISSION:  DATE OF DISCHARGE:  07/04/2002                                 DISCHARGE SUMMARY   ADMISSION DIAGNOSES:  1. End-stage osteoarthritis right knee with valgus deformity.  2. Hypertension.  3. History of upper left hemiplegia.  4. Obesity.   DISCHARGE DIAGNOSES:  1. Right  total knee arthroplasty.  2. Asymptomatic postoperative blood loss anemia.  3. Slow progress with physical therapy.  4. Hypertension.  5. Left upper hemiplegia.  6. Obesity.   HISTORY OF PRESENT ILLNESS:  The patient is a 60 year old white female with  a history of left  total knee arthroplasty in 2000 with good results. The  patient had progressively worsening right knee pain and locking over the  last 4 to 5 months. The pain presently is constant, deep aching sensation  with occasional sharp shooting pains with any locking sensation. She has  fallen several times. She does have night pain. She does have clicking and  locking within the knee. The pain does not radiate. She is currently using a  cane for assistance. She does have left upper extremity hemiplegia. X-rays  reveal right lateral joint collapse with severe OA valgus deformity.   ALLERGIES:  CODEINE, ASPIRIN, PREDNISONE.   CURRENT MEDICATIONS:  1. Doxepin 25 mg p.o. q. p.m.  2. Lotensin 10 mg p.o. q.d.  3. Hydrochlorothiazide 25 mg p.o. q.d.   PROCEDURES:  On June 28, 2002, the patient was taken to the operating  room by Dr. Frederico Hamman assisted by Jamelle Rushing, P.A.-C. Under general  anesthesia the patient underwent a right total knee arthroplasty with an LCS  cemented standard femur, patella and a size 3 tibia with a 12.5 rotating  platform polyethylene liner. The patient tolerated the procedure well. There  were no complications and the patient was transferred to the recovery room  and then to the orthopedic floor for routine postoperative care.   CONSULTS:  The following routine consults were requested:  Physical therapy,  occupational therapy, rehabilitation, case management,  pharmacy for  Coumadin dosing.   HOSPITAL COURSE:  On June 28, 2002, the patient was admitted to [facility] under the care of Dr. Frederico Hamman. The patient was then to  the operating room where the patient underwent a right  total knee  arthroplasty. The patient tolerated the procedure well. There were no  complications. The patient refused a femoral nerve block, and she was  transferred to the recovery room and then to the orthopedic floor for  routine postoperative care. The patient was placed on Coumadin for routine  DVT  prophylaxis and managed by the  pharmacy.   The patient then incurred a total of  6 days postoperative care on the  orthopedic floor during which the patient developed some asymptomatic  postoperative blood loss anemia. Her hemoglobin dropped down to the upper  8s, but the patient denied any light headedness or dizziness with transition  or ambulation. The patient was  not transfused blood, as she was able to  tolerate this level well.   The patient also developed some significant right calf pain which was  evaluated by Doppler which was negative for any signs of deep venous  thrombosis, superficial thrombus or Baker's cyst. The patient's wound  remained benign for any signs of infection. Her leg remained neuro motor  vascularly intact. Her calf soreness gradually improved on a day to day  basis.   The patient's progress was on the slow side with physical therapy. After 6  days she was still total assist with ambulation. She was flexing her knee 0  to 60 degrees with the use of a CPM with pain  medication and muscle  relaxants. Due to the patient's hemiplegia, significant obesity and her  previous left  total knee arthroplasty, the patient was  expected to move a  little bit slower than normal, but she was needing more physical therapy  before she was being transferred home in safe condition.   The patient was evaluated and there were skilled nursing facilities that  were accepted. She was also a possible acceptant on the subacute care unit.  The patient was going to have a final determination on this date and  transferred to the appropriate facility.   LABORATORY DATA:  A CBC on July 03, 2002, WBCs 5.7, hemoglobin 8.6,  hematocrit 25.8, platelets 201. INR 1.7. Routine chemistries: Sodium 138,  potassium 3.9, glucose 109, BUN 8, creatinine 0.5. Routine urinalysis on  admission found a cloudy appearance, small hemoglobin, small leukocyte  esterase and few bacteria. The patient was  treated with IV antibiotics  postoperatively.   DISCHARGE MEDICATIONS:  Upon discharge from orthopedics floor:  1. Colace 100 mg p.o. b.i.d.  2. Trinsicon 1 capsule p.o. t.i.d.  3. Doxepin 25 mg p.o. q.h.s.  4. Lotensin 10 mg p.o. q.d.  5. Hydrochlorothiazide 25 mg p.o. q.d.  6. Laxative  or enema of choice p.r.n.  7. Percocet 1 to  2 tablets q.4-6h. p.r.n. pain.  8. Phenergan 25 mg p.o. q.6h. p.r.n.  9. Tylenol 650 mg p.o. q.4h.  p.r.n.  10.      Robaxin 500 mg p.o. q.6h. p.r.n.  11.      Restoril 30 mg p.o. q.6h. p.r.n.  12.      Coumadin 6 mg p.o. q.d.   DISCHARGE INSTRUCTIONS:  The patient is to continue medications as dispensed  on orthopedic floor. Coumadin should be maintained at an INR of 2 to 2.5 for  a total of  30 days. Pain management, the patient may use the Percocet or  Robaxin p.r.n.   Activity, the patient may be weight bearing as tolerated with close  supervision. She may be up with a walker which is a platform walker. Diet no restrictions, but the patient was evaluated  by nutritional services for  weight control. Wound care, the patient should have a dressing on daily and  the staples removed on postoperative day #14 with Steri-Strips applied,  today is #6. The patient should be monitored for any signs of infection.   Special instructions, the patient  should continue with the CPM. She is  currently at 60 degrees. She is to continue improving at a minimum of 5  degrees a day. The patient needs CPM at least 8 to 10 hours a day.   FOLLOW UP:  The patient needs a followup appointment with Dr. Madelon Lips about  a week and a half to 2 weeks after discharge from rehabilitation services or  skilled nursing facility. Please call [phone] for that followup  appointment.   CONDITION ON DISCHARGE:  The patient's condition upon discharge to skilled  nursing facility or subacute care unit is listed as improved and good.                                               Jamelle Rushing, P.A.    RWK/MEDQ  D:  07/04/2002  T:  07/04/2002  Job:  [id number]
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
## 2010-10-31 NOTE — H&P (Signed)
NAME:  Kelly [name], Kelly [name]                         ACCOUNT NO.:  [id number]   MEDICAL RECORD NO.:  [id number]                   PATIENT TYPE:  INP   LOCATION:  NA                                   FACILITY:  [facility]   PHYSICIAN:  Dyke Brackett, M.D.                 DATE OF BIRTH:  [birth year]   DATE OF ADMISSION:  06/28/2002  DATE OF DISCHARGE:                                HISTORY & PHYSICAL   CHIEF COMPLAINT:  Right knee pain.   HISTORY OF PRESENT ILLNESS:  The patient is a 60 year old white female with  a history of left total knee arthroplasty in July 2000 with excellent  results.  The patient also has a history of long-term CVA with a left upper  extremity hemiplegia.  The patient reports that over the last four to five  months, she has had significant worsening of her right knee chronic pain.  It has, in fact, actually caused her to fall down several times due to it  locking up.  The patient has chronic, constant aching sensation deep within  the knee and sharp shooting pains with locking and releasing.  She does have  otherwise significant grinding in the knee.  The pain does not radiate out  of the knee.  X-rays reveal lateral joint collapse with severe  osteoarthritis with about a 20 degree valgus deformity.   ALLERGIES:  CODEINE and ASPIRIN cause severe stomach upset.  PREDNISONE  causes dizziness and blurring of vision.   CURRENT MEDICATIONS:  1. Doxepin 25 mg p.o. q.p.m.  2. Lotensin 10 mg p.o. daily.  3. Hydrochlorothiazide 25 mg p.o. daily.   PAST MEDICAL HISTORY:  1. Hypertension.  2. History of CVA with left-sided upper extremity hemiplegia.   PAST SURGICAL HISTORY:  1. Brain surgery with steel plate placed at the age of 1.  2  Left leg lengthening procedure during childhood.  1. Appendectomy.  2. Cholecystectomy.  3. Three D&C's.  4. Left knee arthroplasty.  5. Left total knee arthroplasty.  6. The patient indicates she has a significant problem with nausea  and     vomiting with previous anesthesia.   SOCIAL HISTORY:  The patient is a 60 year old slightly obese white female  with no history of current smoking.  She reports having stopped in 1975 with  a prior history of three packs a day over six years.  She denies any alcohol  or drug use.  The patient is currently single, disabled, lives in a one-  story house with one daughter.   Family  physician is Angelena Sole, M.D. at [phone].   FAMILY MEDICAL HISTORY:  Mother is deceased from lung cancer.  Father is  deceased from natural causes and Alzheimer's.  The patient has three  brothers alive, one with a history of throat cancer and two sister alive,  one with history of hypertension.   REVIEW OF  SYSTEMS:  Positive for glasses at all times.  She does have  occasional headaches.  Otherwise Review of Systems is negative.   PHYSICAL EXAMINATION:  VITAL SIGNS:  Height 5 feet 2 inches, weight 260  pounds.  Pulse 96 and regular, respirations 14, blood pressure 132/88.  GENERAL:  This is a healthy-appearing, short in stature, obese white female.  She ambulates with a significant right-sided limp.  She hold her left upper  extremity retracted against her body and has no motor control of it.  The  patient is able to get her self on the off the exam table with assistance.  HEENT:  Head was normocephalic and atraumatic, nontender over maxillary and  frontal sinuses.  Pupils are equal, round, reactive and accommodating to  light.  Extraocular movements intact.  Sclerae nonicteric.  Conjunctivae  pink and moist.  External ears without deformity. Hearing grossly intact.  Nasal septum midline.  Mucous membranes pink and moist.  Oral buccal mucosa  pink and moist without lesions.  Dentition was in good repair. The patient  was able to swallow without any difficulty.  NECK:  Supple.  No palpable lymphadenopathy.  Thyroid gland was nontender.  She had excellent range of motion of her cervical spine  without difficulty  or tenderness.  CHEST:  Lung sounds were clear and equal bilaterally with no wheezes, rales,  or rhonchi noted.  HEART:  Regular rate and rhythm.  S1 and S2 auscultated.  No murmurs, rubs,  or gallops noted.  ABDOMEN:  Round, obese, soft, nontender.  Bowel sounds normoactive.  CVA was  nontender to percussion.  UPPER EXTREMITIES:  Left upper extremity was held up against her abdomen  with flexed wrist and elbow.  She had no motor control and no sensation all  the way from the hand up into the shoulder.  Right arm was normal appearing.  She had full range of motion of shoulder and elbow and motor strength was  5/5.  LOWER EXTREMITIES: legs were symmetrical in length and gross appearance.  She had full extension with bilateral hip flexion up to 90 degrees, limited  by abdominal soft tissue.  She had 20 degree internal and external rotation  without any difficulty.  Left knee had a well-healed midline surgical  incision.  She had typical mechanical clunk with total knee. There was no  instability.  The right knee was without any signs of erythema or  ecchymosis.  She had full extension with 20 degree valgus deformity. She was  tender along the medial and lateral joint line with no effusions.  She  flexed it back to 120 degrees.  She had about 10 degrees valgus varus  laxity.  Calves were nontender.  Ankles were symmetric with good  dorsiflexion and plantar flexion.  PERIPHERAL VASCULAR:  Carotid pulses 2+, radial pulses on the right 2+1 and  on the left not palpable, but skin was warm, pin, and moist.  Dorsalis pedis  and posterior tibial pulses were 1+, warm and pink, with brisk capillary  refill in the feet.  NEUROLOGIC:  The patient was conscious, alert, and appropriate and held easy  conversation with examiner.  Cranial nerves II-XII grossly intact.  Deep  tendon reflexes of right upper extremity and right and left lower extremity were brisk. The left upper  extremity had no sensation from the fingers to  the neck.  BREASTS/RECTAL/GU:  Exams deferred at this time.   IMPRESSION:  1. End-stage osteoarthritis, right knee, with valgus deformity.  2. Hypertension.  3. History of left upper hemiplegia  4. Obesity.    PLAN:  The patient will be admitted to [facility] on 06/28/2002  under the care of Dr. Frederico Hamman for planned right total knee  arthroplasty.  The patient will undergo all routine labs and tests prior to  this procedure.  The patient is expected, due to her upper extremity  limitations, to probably require a stay on rehabilitation facility.     Jamelle Rushing, Arnetha Courser, M.D.    RWK/MEDQ  D:  06/21/2002  T:  06/21/2002  Job:  [id number]
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
## 2010-10-31 NOTE — Op Note (Signed)
NAME:  Kelly [name], Kelly [name]                         ACCOUNT NO.:  [id number]   MEDICAL RECORD NO.:  [id number]                   PATIENT TYPE:  INP   LOCATION:  2550                                 FACILITY:  [facility]   PHYSICIAN:  Thera Flake., M.D.             DATE OF BIRTH:  05-05-1951   DATE OF PROCEDURE:  06/28/2002  DATE OF DISCHARGE:                                 OPERATIVE REPORT   PREOPERATIVE DIAGNOSIS:  Severe osteoarthritis of right knee.   PROCEDURE:  Right total knee replacement (LCS cemented standard femur,  patella, size 3 tibia with 12.5 mm rotating platform polyethylene liner).   SURGEON:  Dyke Brackett, M.D.   ANESTHESIA:  General.   TOURNIQUET TIME:  1 hour 1 minute.   DESCRIPTION OF PROCEDURE:  Sterile prep and drape.  Exsanguination of leg.  Inflation of tourniquet to 400 mmHg.  Longitudinal incision with medial  parapatellar approach to the knee. The knee was in mild valgus with severe  wear on the lateral side as well as the patellofemoral joint.  The medial  compartment was relatively spared, but still moderately arthritic. Tibial  cutting jig was placed cutting about 2 degrees below most of these lateral  compartment, followed by an anterior posterior femoral cut to make the  flexion gap 12.5 mm.  Balancing of the ligaments to release mild tightness  on the medial side following by cutting the distal femur with a 4 degree  valgus cut and matching the flexion extension gap to 12.5 mm.  Anterior and  posterior chamfer cuts were made as well as the keyhole for the prosthesis.  Excess bone was removed from the posterior aspect of the femur and excess  menisci PCL released from the soft tissue constraints of the knee.  Tibia  was next sized after femoral trial which was deemed to be acceptable to a  size 3 tibia with a keel hole cut for the prosthesis followed by a trial  reduction with the tibia and femoral with the 12.5 mm bridging bearing.   Full  extension was noted. No instability in flexion.  The patella was cut,  the patella was moderately worn, so only about 4 to 5 mm of patella was  resected, leaving about 13 mm of native patella. Good stability was obtained  and there was no need for a lateral release.  Trials were removed. Final  components were ready. Antibiotics were placed with two packages of cement  with 750 mg of cefuroxime per batch.  Bony surfaces were irrigated free of  any blood or debris.  Final prosthesis was inserted after insertion of the  bone cement in a tacky state.  Tibia, followed by femur and patella with the  trial bearing was removed after the cement was allowed to harden.  Small  amounts of cement were removed from the posterior aspect of the femur. Small  bleeders were coagulated.  The tourniquet was released after completion of  the procedure, but again the trial bearing was removed and there was no  excessive bleeding from the posterior aspect of the neck. Final bearing was  placed followed by closure with 0 Ethibond, 2-0 Vicryl and skin clips,  Marcaine with epinephrine in the skin, Hemovac drain placed deeply.  Lightly  compressive sterile dressing applied.                                               Thera Flake., M.D.    WDC/MEDQ  D:  06/28/2002  T:  06/28/2002  Job:  [id number]

## 2010-11-22 ENCOUNTER — Encounter: Payer: Self-pay | Admitting: Family Medicine

## 2010-11-24 ENCOUNTER — Ambulatory Visit (INDEPENDENT_AMBULATORY_CARE_PROVIDER_SITE_OTHER): Payer: Medicare Other | Admitting: Family Medicine

## 2010-11-24 ENCOUNTER — Encounter: Payer: Self-pay | Admitting: Family Medicine

## 2010-11-24 VITALS — BP 118/80 | HR 96 | Temp 97.3°F | Wt 251.4 lb

## 2010-11-24 DIAGNOSIS — Z86718 Personal history of other venous thrombosis and embolism: Secondary | ICD-10-CM

## 2010-11-24 DIAGNOSIS — M1990 Unspecified osteoarthritis, unspecified site: Secondary | ICD-10-CM

## 2010-11-24 DIAGNOSIS — E785 Hyperlipidemia, unspecified: Secondary | ICD-10-CM

## 2010-11-24 DIAGNOSIS — G839 Paralytic syndrome, unspecified: Secondary | ICD-10-CM

## 2010-11-24 DIAGNOSIS — I10 Essential (primary) hypertension: Secondary | ICD-10-CM

## 2010-11-24 NOTE — Progress Notes (Signed)
  Subjective:    Patient ID: Kelly [name], female    DOB: 05/31/1951, 60 y.o.   MRN: [id number]  HPI Pt here today to have evaluation to replace her power wheelchair.  She is unable to walk without her walker or cane.  She can not walk more than a few week without her scooter.  The does not let the patient to independently accomplish Mobility Related ADLs in the home in a safe and timely fashion.  She is very unsteady on her feet and has fallen many times without her scooter.  The cane or walker is not sufficient to help the pt get around.  Her gait is unsteady and she falls easily.  Pt pt is unable to [facility] a manual wheelchair secondary to upper ext weakness and paralysis.  She uses her s for bathing, feeding, toileting, grooming and dressing.  Pt has already been using a power wheelchair and needs a replacement.  She requires a joystick controller, requires adjustable armrests and her home does not have sufficient space for maneuvering.  She has the mental and physical ability to use the power wheelchair in the home and she is willing and motivated to use a power wheelchair in the home.     Review of Systems    as above Objective:   Physical Exam  Constitutional: She is oriented to person, place, and time. She appears well-developed and well-nourished.  Cardiovascular: Normal rate and regular rhythm.   Pulmonary/Chest: Effort normal and breath sounds normal.  Neurological: She is alert and oriented to person, place, and time.       Pt is paralyzed L arm with contracture and L leg is weaker than R.   + abnormal gait  Skin: No rash noted. No erythema.  Psychiatric: She has a normal mood and affect. Her behavior is normal. Judgment and thought content normal.          Assessment & Plan:

## 2010-11-25 LAB — HEPATIC FUNCTION PANEL
ALT: 19 U/L (ref 0–35)
Albumin: 4.4 g/dL (ref 3.5–5.2)
Alkaline Phosphatase: 134 U/L — ABNORMAL HIGH (ref 39–117)
Total Protein: 7.7 g/dL (ref 6.0–8.3)

## 2010-11-25 LAB — BASIC METABOLIC PANEL
CO2: 27 mEq/L (ref 19–32)
Chloride: 104 mEq/L (ref 96–112)
Sodium: 139 mEq/L (ref 135–145)

## 2010-11-25 LAB — LIPID PANEL: HDL: 38.2 mg/dL — ABNORMAL LOW (ref >39.00)

## 2010-11-27 NOTE — Assessment & Plan Note (Signed)
Power wheelchair forms filled out and letter done

## 2010-11-27 NOTE — Assessment & Plan Note (Signed)
Check labs con't meds 

## 2010-11-27 NOTE — Assessment & Plan Note (Signed)
con't meds  Check labs 

## 2010-11-27 NOTE — Assessment & Plan Note (Signed)
con't meds 

## 2010-11-27 NOTE — Progress Notes (Signed)
  Subjective:    Patient ID: Kelly [name], female    DOB: 12-28-1950, 60 y.o.   MRN: [id number]  HPI    Review of Systems     Objective:   Physical Exam        Assessment & Plan:

## 2010-11-27 NOTE — Assessment & Plan Note (Signed)
Paperwork filled out for power wheelchair

## 2010-12-08 ENCOUNTER — Ambulatory Visit
Admission: RE | Admit: 2010-12-08 | Discharge: 2010-12-08 | Disposition: A | Discharge status: Home / Self Care | Payer: Medicare Other | Source: Ambulatory Visit | Attending: Family Medicine | Admitting: Family Medicine

## 2010-12-08 ENCOUNTER — Encounter: Payer: Self-pay | Admitting: *Deleted

## 2010-12-08 DIAGNOSIS — Z9889 Other specified postprocedural states: Secondary | ICD-10-CM

## 2010-12-28 ENCOUNTER — Other Ambulatory Visit: Payer: Self-pay | Admitting: Family Medicine

## 2010-12-30 ENCOUNTER — Encounter: Payer: Self-pay | Admitting: *Deleted

## 2010-12-30 ENCOUNTER — Ambulatory Visit: Payer: Medicare Other | Admitting: *Deleted

## 2010-12-30 ENCOUNTER — Encounter: Payer: Medicare Other | Attending: Family Medicine | Admitting: *Deleted

## 2010-12-30 DIAGNOSIS — Z713 Dietary counseling and surveillance: Secondary | ICD-10-CM | POA: Insufficient documentation

## 2010-12-30 NOTE — Patient Instructions (Addendum)
Goals:  Increase protein rich foods  Eat 3 meals/day, Avoid meal skipping  Limit carbohydrate1-2 servings/meal  Choose more whole grains, lean protein, low-fat dairy, and fruits/non-starchy vegetables.  Aim for 20-40 min of  physical activity daily.  Limit sugar-sweetened beverages and concentrated sweets

## 2010-12-30 NOTE — Progress Notes (Signed)
  Medical Nutrition Therapy:  Appt start time: 0800 end time:  0830.   Assessment:  Primary concerns today: weight managment.   MEDICATIONS: No changes since last visit. See medication list.   DIETARY INTAKE:  Usual eating pattern includes 2-3 meals and 1 snacks per day.  Everyday foods include yogurt, fresh fruit, sandwiches.  Avoided foods include concentrated sweets.    24-hr recall:  B (8 AM): yogurt cup OR  2 eggs with 1 pc toast Snk (11 AM): apple OR a pkg of crackers  L (2 PM): sandwich (ham, cheese, wheat bread, lettuce, tomato) Snk (3 PM): ice cream sandwich (small) D (7 PM): Occasionally skips OR 1 pkg crackers Snk (PM): None Fluid: water, unsweetened tea, 2% milk, Diet soda = 64 oz +  Usual physical activity: Walking 2-4 times per week for 15-20 minutes.  Estimated energy needs: 1600 calories 180 g carbohydrates 100 g protein 50 g fat  Progress Towards Goal(s):  In progress.   Nutritional Diagnosis:  NI-1.4 Inadequate energy intake As related to frequent meal skipping.  As evidenced by pt occasionally consuming <1000 calories/day..    Intervention:    Increase protein rich foods  Eat 3 meals/day, Avoid meal skipping  Limit carbohydrate1-2 servings/meal  Choose more whole grains, lean protein, low-fat dairy, and fruits/non-starchy vegetables.  Aim for 20-40 min of  physical activity daily.  Limit sugar-sweetened beverages and concentrated sweets  Monitoring/Evaluation:  Dietary intake, exercise, weight loss progress, and body weight prn.

## 2011-02-19 NOTE — Progress Notes (Signed)
Labs only

## 2011-03-12 LAB — CBC
MCHC: 33.3
MCV: 84.3
Platelets: 221
RBC: 4.07
RDW: 16.8 — ABNORMAL HIGH

## 2011-03-12 LAB — POCT CARDIAC MARKERS
CKMB, poc: <1 — ABNORMAL LOW
Myoglobin, poc: 78.2
Operator id: 173591
Troponin i, poc: 0.08 — ABNORMAL HIGH

## 2011-03-12 LAB — DIFFERENTIAL
Basophils Absolute: 0
Basophils Relative: 1
Eosinophils Absolute: 0.1
Monocytes Relative: 5
Neutro Abs: 6.1
Neutrophils Relative %: 77

## 2011-03-12 LAB — POCT I-STAT, CHEM 8
BUN: 9
Calcium, Ion: 1.16
Chloride: 106
Creatinine, Ser: 0.7
Glucose, Bld: 111 — ABNORMAL HIGH
HCT: 35 — ABNORMAL LOW
Hemoglobin: 11.9 — ABNORMAL LOW
Potassium: 3.5
Sodium: 143
TCO2: 25

## 2011-03-12 LAB — URINE MICROSCOPIC-ADD ON

## 2011-03-12 LAB — URINALYSIS, ROUTINE W REFLEX MICROSCOPIC
Nitrite: NEGATIVE
Specific Gravity, Urine: 1.015
Urobilinogen, UA: 0.2
pH: 5.5

## 2011-03-17 ENCOUNTER — Ambulatory Visit (INDEPENDENT_AMBULATORY_CARE_PROVIDER_SITE_OTHER): Payer: Medicare Other

## 2011-03-17 DIAGNOSIS — Z23 Encounter for immunization: Secondary | ICD-10-CM

## 2011-04-02 ENCOUNTER — Encounter: Payer: Self-pay | Admitting: *Deleted

## 2011-04-02 ENCOUNTER — Encounter: Payer: Medicare Other | Attending: Family Medicine | Admitting: *Deleted

## 2011-04-02 DIAGNOSIS — I10 Essential (primary) hypertension: Secondary | ICD-10-CM | POA: Insufficient documentation

## 2011-04-02 DIAGNOSIS — R7309 Other abnormal glucose: Secondary | ICD-10-CM | POA: Insufficient documentation

## 2011-04-02 DIAGNOSIS — Z713 Dietary counseling and surveillance: Secondary | ICD-10-CM | POA: Insufficient documentation

## 2011-04-02 NOTE — Patient Instructions (Signed)
Goals:  Continue to increase protein rich foods  Eat 3 meals/day, Avoid meal skipping  Limit carbohydrate1-2 servings/meal  Choose more whole grains, lean protein, low-fat dairy, and fruits/non-starchy vegetables.  Aim for 20-40 min of physical activity daily.  Limit sugar-sweetened beverages and concentrated sweets

## 2011-04-02 NOTE — Progress Notes (Signed)
  Medical Nutrition Therapy:  Appt start time: 0729 end time:  0759.  Assessment:  Primary concerns today: weight management. Magaline has lost 4.7 lbs since July 2013.   MEDICATIONS: See updated medication list  DIETARY INTAKE:  24-hr recall:  B (7 AM): Boiled egg, yogurt cup  Snk (10 AM): fresh fruit  L (12 PM): 1/2 sandwich [place] sandwich, 1 cup soup  Snk (3 PM): Protein Bar ([place]) D (6 PM): Hamburger patty w/ onion, green beans, and fresh tomatoes   Beverages: water, unsweetened tea w/ sweet and low, and diet soda = 50-60 oz  Recent physical activity: Walking 20-30 minutes, Daily  Estimated energy needs:  1600 calories  180 g carbohydrates  100 g protein  50 g fat  Progress Towards Goal(s):  In progress.   Nutritional Diagnosis:  Success-3.3 Overweight/obesity As related to past hx of inactivity and poor dietary habits.  As evidenced by pt with BMI >30%.    Intervention:  Nutrition education.  Monitoring/Evaluation:  Dietary intake, exercise, physical activity levels and weight in 3 month(s).

## 2011-04-13 NOTE — Progress Notes (Signed)
Labs only

## 2011-05-11 NOTE — Progress Notes (Signed)
Addended by: Legrand Como on: 05/11/2011 03:40 PM   Modules accepted: Orders

## 2011-05-12 ENCOUNTER — Other Ambulatory Visit: Payer: Self-pay | Admitting: Family Medicine

## 2011-05-20 ENCOUNTER — Ambulatory Visit (INDEPENDENT_AMBULATORY_CARE_PROVIDER_SITE_OTHER): Payer: Medicare Other | Admitting: Family Medicine

## 2011-05-20 ENCOUNTER — Encounter: Payer: Self-pay | Admitting: Family Medicine

## 2011-05-20 DIAGNOSIS — Z8673 Personal history of transient ischemic attack (TIA), and cerebral infarction without residual deficits: Secondary | ICD-10-CM

## 2011-05-20 DIAGNOSIS — I10 Essential (primary) hypertension: Secondary | ICD-10-CM

## 2011-05-20 DIAGNOSIS — R739 Hyperglycemia, unspecified: Secondary | ICD-10-CM

## 2011-05-20 DIAGNOSIS — E785 Hyperlipidemia, unspecified: Secondary | ICD-10-CM

## 2011-05-20 DIAGNOSIS — Z0000 Encounter for general adult medical examination without abnormal findings: Secondary | ICD-10-CM

## 2011-05-20 DIAGNOSIS — R7309 Other abnormal glucose: Secondary | ICD-10-CM

## 2011-05-20 LAB — BASIC METABOLIC PANEL
CO2: 25 mEq/L (ref 19–32)
Chloride: 106 mEq/L (ref 96–112)
GFR: 132.79 mL/min (ref >60.00)
Glucose, Bld: 102 mg/dL — ABNORMAL HIGH (ref 70–99)
Potassium: 3.7 mEq/L (ref 3.5–5.1)
Sodium: 140 mEq/L (ref 135–145)

## 2011-05-20 LAB — CBC WITH DIFFERENTIAL/PLATELET
Basophils Absolute: 0 10*3/uL (ref 0.0–0.1)
HCT: 35.6 % — ABNORMAL LOW (ref 36.0–46.0)
Hemoglobin: 11.7 g/dL — ABNORMAL LOW (ref 12.0–15.0)
Lymphs Abs: 1.6 10*3/uL (ref 0.7–4.0)
MCHC: 32.8 g/dL (ref 30.0–36.0)
MCV: 85.3 fl (ref 78.0–100.0)
Monocytes Absolute: 0.3 10*3/uL (ref 0.1–1.0)
Monocytes Relative: 4.1 % (ref 3.0–12.0)
Neutro Abs: 4.5 10*3/uL (ref 1.4–7.7)
RDW: 16.1 % — ABNORMAL HIGH (ref 11.5–14.6)

## 2011-05-20 LAB — POCT URINALYSIS DIPSTICK
Glucose, UA: NEGATIVE
Ketones, UA: NEGATIVE

## 2011-05-20 LAB — HEMOGLOBIN A1C: Hgb A1c MFr Bld: 4.9 % (ref 4.6–6.5)

## 2011-05-20 LAB — LIPID PANEL
Cholesterol: 121 mg/dL (ref 0–200)
VLDL: 17.8 mg/dL (ref 0.0–40.0)

## 2011-05-20 LAB — HEPATIC FUNCTION PANEL
AST: 19 U/L (ref 0–37)
Albumin: 3.8 g/dL (ref 3.5–5.2)

## 2011-05-20 MED ORDER — ATORVASTATIN CALCIUM 20 MG PO TABS: ORAL_TABLET | ORAL | Status: DC

## 2011-05-20 NOTE — Progress Notes (Signed)
Subjective:    Kelly [name] is a 60 y.o. female who presents for Medicare Annual/Subsequent preventive examination.  Preventive Screening-Counseling & Management  Tobacco History  Smoking status  . Never Smoker   Smokeless tobacco  . Not on file     Problems Prior to Visit 1.   Current Problems (verified) Patient Active Problem List  Diagnoses  . COLONIC POLYPS  . HYPERLIPIDEMIA  . MORBID OBESITY  . PARALYSIS  . OTITIS MEDIA, SEROUS, RIGHT  . HYPERTENSION  . DEEP VENOUS THROMBOPHLEBITIS  . SINUSITIS - ACUTE-NOS  . ASTHMATIC BRONCHITIS, ACUTE  . ALLERGIC RHINITIS DUE TO POLLEN  . PULMONARY EDEMA  . UMBILICAL HERNIA  . UTI  . SKIN TAG  . OSTEOARTHRITIS  . BACK PAIN, THORACIC REGION  . BACK PAIN, ACUTE  . Pain in Soft Tissues of Limb  . OSTEOPOROSIS  . EDEMA  . CHEST PAIN, ATYPICAL  . ABDOMINAL PAIN, LEFT LOWER QUADRANT  . ABDOMINAL PAIN OTHER SPECIFIED SITE  . HYPERGLYCEMIA  . BREAST CANCER, HX OF  . PULMONARY EMBOLISM, HX OF  . KNEE REPLACEMENT, RIGHT, HX OF  . Other Postprocedural Status    Medications Prior to Visit Current Outpatient Prescriptions on File Prior to Visit  Medication Sig Dispense Refill  . aspirin 81 MG EC tablet Take 81 mg by mouth daily.        . benazepril (LOTENSIN) 10 MG tablet take 1 tablet by mouth once daily  90 tablet  1  . Cholecalciferol (VITAMIN D3) 2000 UNITS capsule Take by mouth daily. 2 cap       . furosemide (LASIX) 40 MG tablet take 1 tablet by mouth once daily  90 tablet  1  . KLOR-CON M20 20 MEQ tablet take 1 tablet by mouth once daily  90 tablet  1  . Multiple Vitamin (MULTIVITAMIN) tablet Take 1 tablet by mouth daily.        . Omega-3 Fatty Acids (FISH OIL PO)        . DISCONTD: atorvastatin (LIPITOR) 20 MG tablet take 1 tablet by mouth at bedtime  30 tablet  5    Current Medications (verified) Current Outpatient Prescriptions  Medication Sig Dispense Refill  . aspirin 81 MG EC tablet Take 81 mg by mouth  daily.        Marland Kitchen atorvastatin (LIPITOR) 20 MG tablet 1 po qhs  90 tablet  3  . atorvastatin (LIPITOR) 20 MG tablet 1 po qhs  90 tablet  3  . benazepril (LOTENSIN) 10 MG tablet take 1 tablet by mouth once daily  90 tablet  1  . Cholecalciferol (VITAMIN D3) 2000 UNITS capsule Take by mouth daily. 2 cap       . furosemide (LASIX) 40 MG tablet take 1 tablet by mouth once daily  90 tablet  1  . KLOR-CON M20 20 MEQ tablet take 1 tablet by mouth once daily  90 tablet  1  . Multiple Vitamin (MULTIVITAMIN) tablet Take 1 tablet by mouth daily.        . Omega-3 Fatty Acids (FISH OIL PO)        . DISCONTD: atorvastatin (LIPITOR) 20 MG tablet take 1 tablet by mouth at bedtime  30 tablet  5     Allergies (verified) Aspirin and Codeine   PAST HISTORY  Family History Family History  Problem Relation Age of Onset  . Lung cancer Mother   . Cancer Maternal Aunt   . Cancer Maternal Uncle   .  Cancer Brother   . Alcohol abuse Father   . Hypertension Father   . Heart attack Father 53    Social History History  Substance Use Topics  . Smoking status: Never Smoker   . Smokeless tobacco: Not on file  . Alcohol Use: No     Are there smokers in your home (other than you)? No  Risk Factors Current exercise habits: Exercise is limited by neurologic condition(s): s/p stroke.  Dietary issues discussed: na   Cardiac risk factors: hypertension, obesity (BMI >= 30 kg/m2) and sedentary lifestyle.  Depression Screen (Note: if answer to either of the following is "Yes", a more complete depression screening is indicated)   Over the past two weeks, have you felt down, depressed or hopeless? No  Over the past two weeks, have you felt little interest or pleasure in doing things? No  Have you lost interest or pleasure in daily life? No  Do you often feel hopeless? No  Do you cry easily over simple problems? No  Activities of Daily Living In your present state of health, do you have any difficulty  performing the following activities?:  Driving? Yes Managing money?  No Feeding yourself? No Getting from bed to chair? No Climbing a flight of stairs? Yes--- with walker Preparing food and eating?: No Bathing or showering? No Getting dressed: Yes-- left sided weakness Getting to the toilet? No Using the toilet:No Moving around from place to place: No In the past year have you fallen or had a near fall?:No   Are you sexually active?  No  Do you have more than one partner?  No  Hearing Difficulties: No Do you often ask people to speak up or repeat themselves? No Do you experience ringing or noises in your ears? No Do you have difficulty understanding soft or whispered voices? No   Do you feel that you have a problem with memory? No  Do you often misplace items? No  Do you feel safe at home?  Yes  Cognitive Testing  Alert? Yes  Normal Appearance?Yes  Oriented to person? Yes  Place? Yes   Time? Yes  Recall of three objects?  Yes  Can perform simple calculations? Yes  Displays appropriate judgment?Yes  Can read the correct time from a watch face?Yes   Advanced Directives have been discussed with the patient? Yes  List the Names of Other Physician/Practitioners you currently use: 1.    Indicate any recent Medical Services you may have received from other than [facility] providers in the past year (date may be approximate).  Immunization History  Administered Date(s) Administered  . Influenza Split 03/17/2011  . Influenza Whole 03/30/2007, 03/07/2008, 03/18/2009, 03/11/2010  . Pneumococcal Polysaccharide 08/28/2004, 05/15/2010  . Td 01/12/1998, 04/27/2008    Screening Tests Health Maintenance  Topic Date Due  . Influenza Vaccine  03/15/2012  . Pap Smear  10/23/2012  . Mammogram  12/07/2012  . Tetanus/tdap  04/27/2018  . Colonoscopy  06/27/2020  . Zostavax  Addressed    All answers were reviewed with the patient and necessary referrals were made:  Loreen Freud,  DO   05/20/2011   History reviewed: allergies, current medications, past family history, past medical history, past social history, past surgical history and problem list  Review of Systems  Review of Systems  Constitutional: Negative for activity change, appetite change and fatigue.  HENT: Negative for hearing loss, congestion, tinnitus and ear discharge.   Eyes: Negative for visual disturbance (see optho q1y -- vision  corrected to 20/20 with glasses).  Respiratory: Negative for cough, chest tightness and shortness of breath.   Cardiovascular: Negative for chest pain, palpitations and leg swelling.  Gastrointestinal: Negative for abdominal pain, diarrhea, constipation and abdominal distention.  Genitourinary: Negative for urgency, frequency, decreased urine volume and difficulty urinating.  Musculoskeletal: Negative for back pain, arthralgias and gait problem.  Skin: Negative for color change, pallor and rash.  Neurological: Negative for dizziness, light-headedness, numbness and headaches.  Hematological: Negative for adenopathy. Does not bruise/bleed easily.  Psychiatric/Behavioral: Negative for suicidal ideas, confusion, sleep disturbance, self-injury, dysphoric mood, decreased concentration and agitation.  Pt is able to read and write and can do all ADLs No risk for falling No abuse/ violence in home      Objective:     Vision by Snellen chart: right [id number], left [id number]  Body mass index is 45.43 kg/(m^2). BP 118/72  Pulse 82  Temp(Src) 98 F (36.7 C) (Oral)  Ht 5\' 2"  (1.575 m)  Wt 248 lb 6.4 oz (112.674 kg)  BMI 45.43 kg/m2  SpO2 95%  BP 118/72  Pulse 82  Temp(Src) 98 F (36.7 C) (Oral)  Ht 5\' 2"  (1.575 m)  Wt 248 lb 6.4 oz (112.674 kg)  BMI 45.43 kg/m2  SpO2 95% General appearance: alert, cooperative, appears stated age, no distress and morbidly obese Head: Normocephalic, without obvious abnormality, atraumatic Eyes: conjunctivae/corneas clear. PERRL, EOM's  intact. Fundi benign. Ears: normal TM's and external ear canals both ears Nose: Nares normal. Septum midline. Mucosa normal. No drainage or sinus tenderness. Throat: lips, mucosa, and tongue normal; teeth and gums normal Neck: no adenopathy, no carotid bruit, no JVD, supple, symmetrical, trachea midline and thyroid not enlarged, symmetric, no tenderness/mass/nodules Back: symmetric, no curvature. ROM normal. No CVA tenderness. Lungs: clear to auscultation bilaterally Breasts: normal appearance, no masses or tenderness Heart: regular rate and rhythm, S1, S2 normal, no murmur, click, rub or gallop Abdomen: soft, non-tender; bowel sounds normal; no masses,  no organomegaly Pelvic: not indicated; post-menopausal, no abnormal Pap smears in past Extremities: + left sided weakness-  stable Pulses: 2+ and symmetric Skin: Skin color, texture, turgor normal. No rashes or lesions Lymph nodes: Cervical, supraclavicular, and axillary nodes normal. Neurologic: Mental status: Alert, oriented, thought content appropriate Motor: L sided weakness--stable Psych-- no anxiety / depression     Assessment:     cpe Hyperlipidemia--stable, check labs htn--stable, check labs      Plan:     During the course of the visit the patient was educated and counseled about appropriate screening and preventive services including:    Pneumococcal vaccine   Influenza vaccine  Td vaccine  Screening electrocardiogram  Screening mammography  Screening Pap smear and pelvic exam   Bone densitometry screening  Colorectal cancer screening  Diabetes screening  Glaucoma screening  Nutrition counseling   Advanced directives: has an advanced directive - a copy HAS NOT been provided.  Diet review for nutrition referral? Yes ____  Not Indicated ___x_   Patient Instructions (the written plan) was given to the patient.  Medicare Attestation I have personally reviewed: The patient's medical and social  history Their use of alcohol, tobacco or illicit drugs Their current medications and supplements The patient's functional ability including ADLs,fall risks, home safety risks, cognitive, and hearing and visual impairment Diet and physical activities Evidence for depression or mood disorders  The patient's weight, height, BMI, and visual acuity have been recorded in the chart.  I have made referrals, counseling, and provided education  to the patient based on review of the above and I have provided the patient with a written personalized care plan for preventive services.     Loreen Freud, DO   05/20/2011

## 2011-05-20 NOTE — Patient Instructions (Signed)

## 2011-06-17 ENCOUNTER — Other Ambulatory Visit: Payer: Self-pay | Admitting: Family Medicine

## 2011-06-17 DIAGNOSIS — E785 Hyperlipidemia, unspecified: Secondary | ICD-10-CM

## 2011-06-17 DIAGNOSIS — D649 Anemia, unspecified: Secondary | ICD-10-CM

## 2011-06-18 ENCOUNTER — Other Ambulatory Visit: Payer: Self-pay | Admitting: Family Medicine

## 2011-06-18 ENCOUNTER — Other Ambulatory Visit (INDEPENDENT_AMBULATORY_CARE_PROVIDER_SITE_OTHER): Payer: Medicare Other

## 2011-06-18 DIAGNOSIS — D649 Anemia, unspecified: Secondary | ICD-10-CM

## 2011-06-18 DIAGNOSIS — R7401 Elevation of levels of liver transaminase levels: Secondary | ICD-10-CM

## 2011-06-18 DIAGNOSIS — E785 Hyperlipidemia, unspecified: Secondary | ICD-10-CM

## 2011-06-18 LAB — HEPATIC FUNCTION PANEL
ALT: 15 U/L (ref 0–35)
Bilirubin, Direct: 0.1 mg/dL (ref 0.0–0.3)
Total Protein: 7 g/dL (ref 6.0–8.3)

## 2011-06-18 LAB — CBC WITH DIFFERENTIAL/PLATELET
Eosinophils Relative: 0.8 % (ref 0.0–5.0)
HCT: 35.3 % — ABNORMAL LOW (ref 36.0–46.0)
Hemoglobin: 11.6 g/dL — ABNORMAL LOW (ref 12.0–15.0)
Lymphs Abs: 1.8 10*3/uL (ref 0.7–4.0)
Monocytes Relative: 5.1 % (ref 3.0–12.0)
Neutro Abs: 5.5 10*3/uL (ref 1.4–7.7)
WBC: 7.8 10*3/uL (ref 4.5–10.5)

## 2011-06-18 LAB — LIPID PANEL
Cholesterol: 136 mg/dL (ref 0–200)
HDL: 38.4 mg/dL — ABNORMAL LOW (ref >39.00)
VLDL: 34.8 mg/dL (ref 0.0–40.0)

## 2011-06-18 LAB — FERRITIN: Ferritin: 76.4 ng/mL (ref 10.0–291.0)

## 2011-06-19 LAB — ALKALINE PHOSPHATASE, ISOENZYMES
Alk Phos Bone Fract: 48 U/L
Alk Phos: 111 U/L (ref 39–117)

## 2011-07-07 ENCOUNTER — Encounter: Payer: Self-pay | Admitting: *Deleted

## 2011-07-07 ENCOUNTER — Encounter: Payer: Medicare Other | Attending: Family Medicine | Admitting: *Deleted

## 2011-07-07 DIAGNOSIS — E663 Overweight: Secondary | ICD-10-CM | POA: Insufficient documentation

## 2011-07-07 DIAGNOSIS — Z713 Dietary counseling and surveillance: Secondary | ICD-10-CM | POA: Insufficient documentation

## 2011-07-07 NOTE — Patient Instructions (Addendum)
Goals:  Continue to increase protein rich foods with meals/snacks Eat 3 meals/day, Avoid meal skipping  Limit carbohydrate1-2 servings/meal       -Follow Plate Method for Portion and Carbohydrate Control  Aim for 20-40 min of physical activity daily by walking, as able Limit sugar-sweetened beverages and concentrated sweets Stay hydrated to aid with constipation; 50-46 oz fluids per day  Hang in there and keep exercising as much as you can :)                            See you in 3 months!

## 2011-07-07 NOTE — Progress Notes (Signed)
  Medical Nutrition Therapy: Appt start time: 0730 end time: 0800.   Assessment: Primary concerns today: weight management. Amyiah has regained 4 lbs since her last visit lbs since October 2012. She reports having made some poor choices over the holidays. She also went on a family cruise where she did some poor eating. Her aid continues to help her with meal preparation and grocery shopping which continues to support her lifestyle changes.   MEDICATIONS: See updated medication list; Pt started Iron Supplements per PCP  DIETARY INTAKE:   24-hr recall:  B (7 AM): Boiled egg (1), yogurt cup  Snk (10 AM): fresh fruit OR 1 pack peanut butter crackers L (12 PM): 1/2 sandwich [place] sandwich, 1 cup soup  Snk (3 PM): Protein Bar ([employer] [place]) OR Special K Protein Bar D (6 PM): Hamburger patty w/ onion, green beans, and fresh tomatoes OR [place] sandwich on wheat bread  Beverages: water, unsweetened tea w/ sweet and low, 2% milk, and diet soda = 50-60 oz   Recent physical activity: Pt had a history of walking 20-30 minutes yet due to colder weather and knee pain she has not been active since November 2012  Estimated energy needs:  1600 calories  180 g carbohydrates  100 g protein  50 g fat   Progress Towards Goal(s): In progress.   Nutritional Diagnosis:  [place]-3.3 Overweight/obesity As related to past hx of inactivity and poor dietary habits. As evidenced by pt with BMI >30%.   Intervention: Nutrition education/reinforcement  Monitoring/Evaluation: Dietary intake, exercise, physical activity levels and weight in 3 month(s).

## 2011-07-17 ENCOUNTER — Other Ambulatory Visit: Payer: Self-pay | Admitting: Family Medicine

## 2011-07-17 DIAGNOSIS — D649 Anemia, unspecified: Secondary | ICD-10-CM

## 2011-07-20 ENCOUNTER — Other Ambulatory Visit: Payer: Medicare Other

## 2011-07-20 ENCOUNTER — Ambulatory Visit (INDEPENDENT_AMBULATORY_CARE_PROVIDER_SITE_OTHER): Payer: Medicare Other | Admitting: Family Medicine

## 2011-07-20 ENCOUNTER — Encounter: Payer: Self-pay | Admitting: Family Medicine

## 2011-07-20 VITALS — BP 134/82 | HR 88 | Temp 97.6°F | Wt 248.4 lb

## 2011-07-20 DIAGNOSIS — D649 Anemia, unspecified: Secondary | ICD-10-CM

## 2011-07-20 DIAGNOSIS — J209 Acute bronchitis, unspecified: Secondary | ICD-10-CM

## 2011-07-20 DIAGNOSIS — R062 Wheezing: Secondary | ICD-10-CM

## 2011-07-20 DIAGNOSIS — E785 Hyperlipidemia, unspecified: Secondary | ICD-10-CM

## 2011-07-20 DIAGNOSIS — J40 Bronchitis, not specified as acute or chronic: Secondary | ICD-10-CM

## 2011-07-20 MED ORDER — AZITHROMYCIN 250 MG PO TABS: ORAL_TABLET | ORAL | Status: AC

## 2011-07-20 MED ORDER — PREDNISONE 10 MG PO TABS: ORAL_TABLET | ORAL | Status: DC

## 2011-07-20 MED ORDER — ALBUTEROL SULFATE (5 MG/ML) 0.5% IN NEBU
2.5000 mg | INHALATION_SOLUTION | Freq: Once | RESPIRATORY_TRACT | Status: AC
  Administered 2011-07-20: 2.5 mg via RESPIRATORY_TRACT

## 2011-07-20 MED ORDER — BENZONATATE 200 MG PO CAPS: 200.0000 mg | ORAL_CAPSULE | Freq: Two times a day (BID) | ORAL | Status: AC | PRN

## 2011-07-20 NOTE — Patient Instructions (Signed)

## 2011-07-20 NOTE — Progress Notes (Signed)
  Subjective:     Kelly [name] is a 61 y.o. female here for evaluation of a cough. Onset of symptoms was 12 days ago. Symptoms have been gradually worsening since that time. The cough is productive and is aggravated by exercise and reclining position. Associated symptoms include: shortness of breath, sputum production and wheezing. Patient does not have a history of asthma. Patient does not have a history of environmental allergens. Patient has not traveled recently. Patient does not have a history of smoking. Patient has not had a previous chest x-ray. Patient has not had a PPD done.  The following portions of the patient's history were reviewed and updated as appropriate: allergies, current medications, past family history, past medical history, past social history, past surgical history and problem list.  Review of Systems Pertinent items are noted in HPI.    Objective:    Oxygen saturation 96% on room air BP 134/82  Pulse 88  Temp(Src) 97.6 F (36.4 C) (Oral)  Wt 248 lb 6.4 oz (112.674 kg)  SpO2 96% General appearance: alert, cooperative, appears stated age and no distress Ears: normal TM's and external ear canals both ears Nose: Nares normal. Septum midline. Mucosa normal. No drainage or sinus tenderness. Throat: lips, mucosa, and tongue normal; teeth and gums normal Neck: no adenopathy, supple, symmetrical, trachea midline and thyroid not enlarged, symmetric, no tenderness/mass/nodules Lungs: diminished breath sounds bilaterally and wheezes bilaterally Heart: S1, S2 normal Extremities: extremities normal, atraumatic, no cyanosis or edema    Assessment:    Acute Bronchitis    Plan:    Antibiotics per medication orders. Antitussives per medication orders. Avoid exposure to tobacco smoke and fumes. Call if shortness of breath worsens, blood in sputum, change in character of cough, development of fever or chills, inability to maintain nutrition and hydration. Avoid exposure  to tobacco smoke and fumes. prednisone taper

## 2011-07-21 LAB — CBC WITH DIFFERENTIAL/PLATELET
Basophils Relative: 0.2 % (ref 0.0–3.0)
Eosinophils Absolute: 0.1 10*3/uL (ref 0.0–0.7)
HCT: 34.5 % — ABNORMAL LOW (ref 36.0–46.0)
Lymphs Abs: 3.4 10*3/uL (ref 0.7–4.0)
MCHC: 34.3 g/dL (ref 30.0–36.0)
MCV: 88.2 fl (ref 78.0–100.0)
Monocytes Absolute: 0.3 10*3/uL (ref 0.1–1.0)
Neutrophils Relative %: 62.8 % (ref 43.0–77.0)
RBC: 3.91 Mil/uL (ref 3.87–5.11)

## 2011-07-21 LAB — LIPID PANEL
HDL: 39 mg/dL — ABNORMAL LOW (ref >39.00)
LDL Cholesterol: 78 mg/dL (ref 0–99)
Total CHOL/HDL Ratio: 4
Triglycerides: 132 mg/dL (ref 0.0–149.0)
VLDL: 26.4 mg/dL (ref 0.0–40.0)

## 2011-07-21 LAB — BASIC METABOLIC PANEL
CO2: 26 mEq/L (ref 19–32)
Chloride: 107 mEq/L (ref 96–112)
Potassium: 4.5 mEq/L (ref 3.5–5.1)
Sodium: 142 mEq/L (ref 135–145)

## 2011-07-21 LAB — HEPATIC FUNCTION PANEL
Albumin: 3.9 g/dL (ref 3.5–5.2)
Alkaline Phosphatase: 126 U/L — ABNORMAL HIGH (ref 39–117)
Total Protein: 7.6 g/dL (ref 6.0–8.3)

## 2011-07-21 LAB — IBC PANEL: Transferrin: 244.7 mg/dL (ref 212.0–360.0)

## 2011-08-03 ENCOUNTER — Other Ambulatory Visit: Payer: Self-pay | Admitting: Family Medicine

## 2011-08-03 DIAGNOSIS — R7989 Other specified abnormal findings of blood chemistry: Secondary | ICD-10-CM

## 2011-08-06 ENCOUNTER — Other Ambulatory Visit (INDEPENDENT_AMBULATORY_CARE_PROVIDER_SITE_OTHER): Payer: Medicare Other

## 2011-08-06 DIAGNOSIS — R7989 Other specified abnormal findings of blood chemistry: Secondary | ICD-10-CM

## 2011-08-06 LAB — BASIC METABOLIC PANEL
CO2: 27 mEq/L (ref 19–32)
Calcium: 8.7 mg/dL (ref 8.4–10.5)
Creatinine, Ser: 0.5 mg/dL (ref 0.4–1.2)
GFR: 124.07 mL/min (ref >60.00)
Glucose, Bld: 100 mg/dL — ABNORMAL HIGH (ref 70–99)

## 2011-08-06 LAB — HEPATIC FUNCTION PANEL
ALT: 16 U/L (ref 0–35)
Albumin: 3.8 g/dL (ref 3.5–5.2)
Total Protein: 7.1 g/dL (ref 6.0–8.3)

## 2011-08-07 LAB — ALKALINE PHOSPHATASE, ISOENZYMES
Alk Phos Bone Fract: 67 U/L
Alk Phos: 110 U/L (ref 39–117)

## 2011-09-22 ENCOUNTER — Encounter: Payer: Medicare Other | Attending: Family Medicine | Admitting: *Deleted

## 2011-09-22 ENCOUNTER — Other Ambulatory Visit: Payer: Medicare Other

## 2011-09-22 ENCOUNTER — Encounter: Payer: Self-pay | Admitting: *Deleted

## 2011-09-22 DIAGNOSIS — Z713 Dietary counseling and surveillance: Secondary | ICD-10-CM | POA: Insufficient documentation

## 2011-09-22 DIAGNOSIS — E663 Overweight: Secondary | ICD-10-CM | POA: Insufficient documentation

## 2011-09-22 NOTE — Patient Instructions (Signed)
Plan: Walk as able either alone or with a friend each day in neighborhood Consider Arm Chair Exercises on days when not able to walk Plan to increase heart rate for 5-15 minutes every day Continue with current meal plan

## 2011-09-22 NOTE — Progress Notes (Signed)
Medical Nutrition Therapy: Appt start time: 0730 end time: 0800.   Assessment: Primary concerns today: weight management. Kelly [name] has gained weight again, but states she was 248# at home and has not taken her water pill yet today. She has not resumed her walking due to cold weather but states she plans to start now that it is warmer. Meal preparation and eating habits continue to be fairly appropriate.  MEDICATIONS: See updated medication list;   DIETARY INTAKE:  24-hr recall:  B (7 AM): Egg Beaters egg (1), yogurt cup, banana, 2% milk Snk (10 AM):  1 pack peanut butter crackers OR Special K Bar L (12 PM): 1 sandwich [place] sandwich, tea with [employer] 1 cup for whole gallon - lasts thru the week Snk (3 PM): Protein Bar ([employer] [place]) OR Special K Protein Bar D (6 PM): Hamburger patty w/ onion, green beans, and fresh tomatoes OR [place] sandwich on wheat bread  Beverages: water, unsweetened tea w/ sweet and low, 2% milk, and diet soda = 50-60 oz   Recent physical activity: Pt had a history of walking 20-30 minutes yet due to colder weather and knee pain she has not been active since November 2012  Estimated energy needs:  1600 calories  180 g carbohydrates  100 g protein  50 g fat   Progress Towards Goal(s): In progress.   Nutritional Diagnosis:  [place]-3.3 Overweight/obesity As related to past hx of inactivity and poor dietary habits. As evidenced by pt with BMI >30%.   Intervention: Nutrition education/reinforcement. Suggest she consider Arm Chair Exercise Class offered where she lives and to implement in her own home once familiar with examples. Also encouraged her to consider doing these exercises for 5-10 minutes more than once a day to increase metabolism and assist with weight loss. Plan: Walk as able either alone or with a friend each day in neighborhood Consider Arm Chair Exercises on days when not able to walk Plan to increase heart rate for 5-15 minutes every day Continue  with current meal plan   Monitoring/Evaluation: Dietary intake, exercise, physical activity levels and weight in 3 month(s).

## 2011-09-28 ENCOUNTER — Other Ambulatory Visit: Payer: Medicare Other

## 2011-11-01 ENCOUNTER — Emergency Department (HOSPITAL_BASED_OUTPATIENT_CLINIC_OR_DEPARTMENT_OTHER)
Admission: EM | Admit: 2011-11-01 | Discharge: 2011-11-01 | Disposition: A | Discharge status: Home / Self Care | Payer: Medicare Other | Attending: Emergency Medicine | Admitting: Emergency Medicine

## 2011-11-01 ENCOUNTER — Emergency Department (HOSPITAL_BASED_OUTPATIENT_CLINIC_OR_DEPARTMENT_OTHER): Payer: Medicare Other

## 2011-11-01 ENCOUNTER — Encounter (HOSPITAL_BASED_OUTPATIENT_CLINIC_OR_DEPARTMENT_OTHER): Payer: Self-pay | Admitting: *Deleted

## 2011-11-01 DIAGNOSIS — M25529 Pain in unspecified elbow: Secondary | ICD-10-CM | POA: Insufficient documentation

## 2011-11-01 DIAGNOSIS — Y9229 Other specified public building as the place of occurrence of the external cause: Secondary | ICD-10-CM | POA: Insufficient documentation

## 2011-11-01 DIAGNOSIS — S40019A Contusion of unspecified shoulder, initial encounter: Secondary | ICD-10-CM | POA: Insufficient documentation

## 2011-11-01 DIAGNOSIS — M25539 Pain in unspecified wrist: Secondary | ICD-10-CM | POA: Insufficient documentation

## 2011-11-01 DIAGNOSIS — Z853 Personal history of malignant neoplasm of breast: Secondary | ICD-10-CM | POA: Insufficient documentation

## 2011-11-01 DIAGNOSIS — W1789XA Other fall from one level to another, initial encounter: Secondary | ICD-10-CM | POA: Insufficient documentation

## 2011-11-01 DIAGNOSIS — M25519 Pain in unspecified shoulder: Secondary | ICD-10-CM | POA: Insufficient documentation

## 2011-11-01 DIAGNOSIS — Z86711 Personal history of pulmonary embolism: Secondary | ICD-10-CM | POA: Insufficient documentation

## 2011-11-01 DIAGNOSIS — M24549 Contracture, unspecified hand: Secondary | ICD-10-CM | POA: Insufficient documentation

## 2011-11-01 DIAGNOSIS — I10 Essential (primary) hypertension: Secondary | ICD-10-CM | POA: Insufficient documentation

## 2011-11-01 DIAGNOSIS — W19XXXA Unspecified fall, initial encounter: Secondary | ICD-10-CM

## 2011-11-01 MED ORDER — NAPROXEN 500 MG PO TABS: 500.0000 mg | ORAL_TABLET | Freq: Two times a day (BID) | ORAL | Status: DC

## 2011-11-01 MED ORDER — OXYCODONE-ACETAMINOPHEN 5-325 MG PO TABS: 1.0000 | ORAL_TABLET | Freq: Four times a day (QID) | ORAL | Status: DC | PRN

## 2011-11-01 MED ORDER — IBUPROFEN 800 MG PO TABS
800.0000 mg | ORAL_TABLET | Freq: Once | ORAL | Status: AC
  Administered 2011-11-01: 800 mg via ORAL
  Filled 2011-11-01: qty 1

## 2011-11-01 MED ORDER — OXYCODONE-ACETAMINOPHEN 5-325 MG PO TABS
2.0000 | ORAL_TABLET | Freq: Once | ORAL | Status: AC
  Administered 2011-11-01: 2 via ORAL
  Filled 2011-11-01: qty 2
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
## 2011-11-01 NOTE — Discharge Instructions (Signed)

## 2011-11-05 ENCOUNTER — Encounter: Payer: Self-pay | Admitting: Family Medicine

## 2011-11-05 ENCOUNTER — Ambulatory Visit (INDEPENDENT_AMBULATORY_CARE_PROVIDER_SITE_OTHER): Payer: Medicare Other | Admitting: Family Medicine

## 2011-11-05 VITALS — BP 122/76 | HR 97 | Temp 97.6°F | Wt 251.0 lb

## 2011-11-05 DIAGNOSIS — M25512 Pain in left shoulder: Secondary | ICD-10-CM

## 2011-11-05 DIAGNOSIS — M25519 Pain in unspecified shoulder: Secondary | ICD-10-CM

## 2011-11-05 MED ORDER — HYDROCODONE-ACETAMINOPHEN 5-500 MG PO TABS: 1.0000 | ORAL_TABLET | Freq: Three times a day (TID) | ORAL | Status: AC | PRN

## 2011-11-05 NOTE — Patient Instructions (Signed)
Shoulder Pain The shoulder is a ball and socket joint. The muscles and tendons (rotator cuff) are what keep the shoulder in its joint and stable. This collection of muscles and tendons holds in the head (ball) of the humerus (upper arm bone) in the fossa (cup) of the scapula (shoulder blade). Today no reason was found for your shoulder pain. Often pain in the shoulder may be treated conservatively with temporary immobilization. For example, holding the shoulder in one place using a sling for rest. Physical therapy may be needed if problems continue. HOME CARE INSTRUCTIONS   Apply ice to the sore area for 15 to 20 minutes, 3 to 4 times per day for the first 2 days. Put the ice in a plastic bag. Place a towel between the bag of ice and your skin.   If you have or were given a shoulder sling and straps, do not remove for as long as directed by your caregiver or until you see a caregiver for a follow-up examination. If you need to remove it to shower or bathe, move your arm as little as possible.   Sleep on several pillows at night to lessen swelling and pain.   Only take over-the-counter or prescription medicines for pain, discomfort, or fever as directed by your caregiver.   Keep any follow-up appointments in order to avoid any type of permanent shoulder disability or chronic pain problems.  SEEK MEDICAL CARE IF:   Pain in your shoulder increases or new pain develops in your arm, hand, or fingers.   Your hand or fingers are colder than your other hand.   You do not obtain pain relief with the medications or your pain becomes worse.  SEEK IMMEDIATE MEDICAL CARE IF:   Your arm, hand, or fingers are numb or tingling.   Your arm, hand, or fingers are swollen, painful, or turn white or blue.   You develop chest pain or shortness of breath.  MAKE SURE YOU:   Understand these instructions.   Will watch your condition.   Will get help right away if you are not doing well or get worse.    Document Released: 03/11/2005 Document Revised: 05/21/2011 Document Reviewed: 05/16/2011 [employer] Patient Information 2012 [employer]. 

## 2011-11-05 NOTE — Progress Notes (Signed)
  Subjective:    Kelly [name] is a 61 y.o. female who presents with left shoulder pain. The symptoms began several days ago. Aggravating factors: fall at church. Pain is located r shoulder blade. Discomfort is described as sharp/stabbing. Symptoms are exacerbated by any movement. Evaluation to date: plain films: old rotator cuff tear. Therapy to date includes: rest, ice, prescription NSAIDS which are not very effective and opioids which are not very effective.  The following portions of the patient's history were reviewed and updated as appropriate: allergies, current medications, past family history, past medical history, past social history, past surgical history and problem list.  Review of Systems Pertinent items are noted in HPI.   Objective:    BP 122/76  Pulse 97  Temp(Src) 97.6 F (36.4 C) (Oral)  Wt 251 lb (113.853 kg)  SpO2 99% Right shoulder: normal active ROM, no tenderness, no impingement sign  Left shoulder: L hemiparesis---old                                  Pain L shoulder blade,  She has kept arm in sling almost 24 hours a day secondary to pain.    Assessment:    Left shoulder pain    Plan:    Reduction in offending activity. Gentle ROM exercises. Rest, ice, compression, and elevation (RICE) therapy. Orthopedics referral.

## 2011-11-07 ENCOUNTER — Other Ambulatory Visit: Payer: Self-pay | Admitting: Family Medicine

## 2011-11-19 ENCOUNTER — Other Ambulatory Visit: Payer: Self-pay | Admitting: Family Medicine

## 2011-11-19 NOTE — Telephone Encounter (Signed)
Last seen and filled 11/05/11 # 30. Please advise    KP

## 2011-12-08 ENCOUNTER — Other Ambulatory Visit: Payer: Self-pay | Admitting: Family Medicine

## 2011-12-08 DIAGNOSIS — Z9889 Other specified postprocedural states: Secondary | ICD-10-CM

## 2011-12-08 DIAGNOSIS — Z853 Personal history of malignant neoplasm of breast: Secondary | ICD-10-CM

## 2011-12-11 ENCOUNTER — Ambulatory Visit
Admission: RE | Admit: 2011-12-11 | Discharge: 2011-12-11 | Disposition: A | Discharge status: Home / Self Care | Payer: Medicare Other | Source: Ambulatory Visit | Attending: Family Medicine | Admitting: Family Medicine

## 2011-12-11 DIAGNOSIS — Z853 Personal history of malignant neoplasm of breast: Secondary | ICD-10-CM

## 2011-12-11 DIAGNOSIS — Z9889 Other specified postprocedural states: Secondary | ICD-10-CM

## 2011-12-22 ENCOUNTER — Ambulatory Visit: Payer: Medicare Other | Admitting: *Deleted

## 2012-01-13 ENCOUNTER — Ambulatory Visit: Payer: Medicare Other | Admitting: *Deleted

## 2012-01-18 ENCOUNTER — Other Ambulatory Visit (INDEPENDENT_AMBULATORY_CARE_PROVIDER_SITE_OTHER): Payer: Medicare Other

## 2012-01-18 DIAGNOSIS — I10 Essential (primary) hypertension: Secondary | ICD-10-CM

## 2012-01-18 DIAGNOSIS — E119 Type 2 diabetes mellitus without complications: Secondary | ICD-10-CM

## 2012-01-18 LAB — HEPATIC FUNCTION PANEL
Alkaline Phosphatase: 113 U/L (ref 39–117)
Bilirubin, Direct: 0.1 mg/dL (ref 0.0–0.3)
Total Bilirubin: 0.8 mg/dL (ref 0.3–1.2)

## 2012-01-18 LAB — BASIC METABOLIC PANEL
BUN: 16 mg/dL (ref 6–23)
Chloride: 107 mEq/L (ref 96–112)
Creatinine, Ser: 0.4 mg/dL (ref 0.4–1.2)
Glucose, Bld: 95 mg/dL (ref 70–99)
Potassium: 4.3 mEq/L (ref 3.5–5.1)

## 2012-01-18 LAB — MICROALBUMIN / CREATININE URINE RATIO: Microalb Creat Ratio: 0.1 mg/g (ref 0.0–30.0)

## 2012-01-18 LAB — LIPID PANEL
Cholesterol: 125 mg/dL (ref 0–200)
LDL Cholesterol: 70 mg/dL (ref 0–99)
VLDL: 18 mg/dL (ref 0.0–40.0)

## 2012-01-18 NOTE — Progress Notes (Signed)
Lab only 

## 2012-01-21 ENCOUNTER — Encounter: Payer: Self-pay | Admitting: *Deleted

## 2012-01-21 ENCOUNTER — Ambulatory Visit: Payer: Medicare Other | Admitting: *Deleted

## 2012-01-21 ENCOUNTER — Encounter: Payer: Medicare Other | Attending: Family Medicine | Admitting: *Deleted

## 2012-01-21 DIAGNOSIS — Z713 Dietary counseling and surveillance: Secondary | ICD-10-CM | POA: Insufficient documentation

## 2012-01-21 NOTE — Progress Notes (Signed)
Medical Nutrition Therapy: Appt start time: 1530 end time: 1600.   Assessment: Primary concerns today: patient here for obesity. States she fell in May, hurt rotator cuff of left shoulder so unable to exercise for about a month.  States she is trying to make healthy food choices and is pleased she hasn't gained weight since last visit. States she is walking again, about 20 minutes Monday through Friday and some in the house too.  MEDICATIONS: See medication list;   DIETARY INTAKE:  24-hr recall:  B (7 AM): Egg Beaters egg (1), 1 toast and milk OR  yogurt cup, banana, 2% milk Snk (10 AM):  Occasionally a Special K Bar L (12 PM): 1 sandwich [place] sandwich, tea with Domino Light 1 cup for whole gallon - lasts thru the week Snk (3 PM): Protein Bar ([employer] [place]) OR Special K Protein Bar D (6 PM): Hamburger patty w/ onion, green beans, and fresh tomatoes OR [place] sandwich on wheat bread  Beverages: water, unsweetened tea w/ sweet and low, 2% milk, and diet soda = 50-60 oz   Recent physical activity: Pt has resumed walking 20-30 minutes each day Monday through Friday.  Estimated energy needs:  1400 calories  158 g carbohydrates  105 g protein   39 g fat   Progress Towards Goal(s): In progress.   Nutritional Diagnosis:  [place]-3.3 Overweight/obesity As related to past hx of inactivity and poor dietary habits. As evidenced by pt with BMI >30%.   Intervention: Nutrition education/reinforcement. Encouraged her to continue with walking each AM as she has done the past 2-3 weeks and to continue with healthy food choices. Plan: Continue walking as able either alone or with a friend each day in neighborhood @ 6 AM Plan to increase heart rate for 5-15 minutes every day Continue with current meal plan of 2 Carbs per meal, 0-1 per snack if hungry   Monitoring/Evaluation: Dietary intake, exercise, physical activity levels and weight in 1 month(s).

## 2012-01-21 NOTE — Patient Instructions (Signed)
Plan: Continue walking as able either alone or with a friend each day in neighborhood @ 6 AM Plan to increase heart rate for 5-15 minutes every day Continue with current meal plan of 2 Carbs per meal, 0-1 per snack if hungry

## 2012-01-25 ENCOUNTER — Encounter: Payer: Self-pay | Admitting: *Deleted

## 2012-01-28 ENCOUNTER — Encounter: Payer: Self-pay | Admitting: Family Medicine

## 2012-01-28 ENCOUNTER — Ambulatory Visit (INDEPENDENT_AMBULATORY_CARE_PROVIDER_SITE_OTHER): Payer: Medicare Other | Admitting: Family Medicine

## 2012-01-28 VITALS — BP 120/72 | HR 96 | Temp 98.3°F | Wt 250.8 lb

## 2012-01-28 DIAGNOSIS — N63 Unspecified lump in unspecified breast: Secondary | ICD-10-CM

## 2012-01-28 NOTE — Progress Notes (Signed)
  Subjective:    Patient ID: Kelly [name], female    DOB: 1950/06/18, 61 y.o.   MRN: [id number]  HPI Pt here c/o finding a mass l breast while bathing this week.  It is tender and pt is concerned secondary to her breast CA history.     Review of Systems As above    Objective:   Physical Exam  Constitutional: She is oriented to person, place, and time. She appears well-developed and well-nourished.  Genitourinary:       Breast-- L breast mass upper outer quadrant  Neurological: She is alert and oriented to person, place, and time.  Psychiatric: She has a normal mood and affect. Her behavior is normal.          Assessment & Plan:

## 2012-02-04 ENCOUNTER — Ambulatory Visit
Admission: RE | Admit: 2012-02-04 | Discharge: 2012-02-04 | Disposition: A | Discharge status: Home / Self Care | Payer: Medicare Other | Source: Ambulatory Visit | Attending: Family Medicine | Admitting: Family Medicine

## 2012-02-04 DIAGNOSIS — N63 Unspecified lump in unspecified breast: Secondary | ICD-10-CM

## 2012-02-18 ENCOUNTER — Encounter: Payer: Medicare Other | Attending: Family Medicine | Admitting: *Deleted

## 2012-02-18 ENCOUNTER — Encounter: Payer: Self-pay | Admitting: *Deleted

## 2012-02-18 DIAGNOSIS — Z713 Dietary counseling and surveillance: Secondary | ICD-10-CM | POA: Insufficient documentation

## 2012-02-18 NOTE — Patient Instructions (Signed)
Plan: Continue walking as able either alone or with a friend each day in neighborhood @ 6 AM Plan to increase heart rate for 5-15 minutes every day Continue with current meal plan of 2 Carbs per meal, 0-1 per snack if hungry 

## 2012-02-18 NOTE — Progress Notes (Signed)
Medical Nutrition Therapy: Appt start time: 1215 end time: 1245.   Assessment: Primary concerns today: patient here for follow up visit for obesity. She is very excited about her increase in her activity level, stating she started walking 2 laps around her apartment complex THREE times every day at 30 minutes each time for total of 1 1/2 hours of exercise daily. Unfortunately she fell last week and hurt her ankle so has not been able to walk this past week. She requested assistance with a grocery list today and suggestions for best fruits and vegetables to buy.   MEDICATIONS: See medication list; no change  [place]AN[place]  BODY COMP RESULTS on 02/18/2012  Weight   249.5 lb   %Fat 49.5 %   Fat Mass (lbs) 123.5 lb   Fat Free Mass (lbs) 126 lb   Total Body Water (lbs) 92 lb   DIETARY INTAKE:  24-hr recall:  B (7 AM): Egg Beaters egg (1), 1 toast and milk OR  yogurt cup, banana, 2% milk Snk (10 AM):  Occasionally a Special K Bar L (12 PM): 1 sandwich [place] sandwich, tea with Domino Light 1 cup for whole gallon - lasts thru the week Snk (3 PM): Protein Bar ([employer] [place]) OR Special K Protein Bar D (6 PM): Hamburger patty w/ onion, green beans, and fresh tomatoes OR [place] sandwich on wheat bread  Beverages: water, unsweetened tea w/ sweet and low, 2% milk, and diet soda = 50-60 oz   Recent physical activity: Pt had increased walking 30 minutes three different times each day (AM, mid day and PM) Monday through Friday!  Estimated energy needs:  1400 calories  158 g carbohydrates  105 g protein   39 g fat   Progress Towards Goal(s): In progress.   Nutritional Diagnosis:  -3.3 Overweight/obesity As related to past hx of inactivity and poor dietary habits. As evidenced by pt with BMI >30%.   Intervention: Patient very concerned over initial weight on standard scale today indicating no weight loss @ 252 lbs considering how much she has been exercising and eating appropriately. She states her  clothes are fitting more loosely and she feels a lot better. I explained the weight difference of muscle vs fat and offered to weigh her on the [place] scale to determine the % of fat to muscle. She agreed and results are charted above. [place] Scale is typically 2 pounds lower than standard scale and will use this as a baseline for future appointments to track progress of % fat lost. Also provided her a Grocery List with suggestions of types of foods to buy. Plan to discuss Carb Counting in more detail next visit.  Plan: Continue walking as able either alone or with a friend each day in neighborhood @ 6 AM and two more times each day, once ankle feels better Plan to continue with increase heart rate for 30-90 minutes every day Continue with current meal plan of 2 Carbs per meal, 0-1 per snack if hungry   Monitoring/Evaluation: Dietary intake, exercise, physical activity levels and weight in 1 month(s). Discuss Carb Counting in more detail at next visit.

## 2012-02-19 ENCOUNTER — Ambulatory Visit (HOSPITAL_BASED_OUTPATIENT_CLINIC_OR_DEPARTMENT_OTHER)
Admission: RE | Admit: 2012-02-19 | Discharge: 2012-02-19 | Disposition: A | Discharge status: Home / Self Care | Payer: Medicare Other | Source: Ambulatory Visit | Attending: Family Medicine | Admitting: Family Medicine

## 2012-02-19 ENCOUNTER — Ambulatory Visit (INDEPENDENT_AMBULATORY_CARE_PROVIDER_SITE_OTHER): Payer: Medicare Other | Admitting: Family Medicine

## 2012-02-19 ENCOUNTER — Encounter: Payer: Self-pay | Admitting: Family Medicine

## 2012-02-19 ENCOUNTER — Other Ambulatory Visit: Payer: Self-pay | Admitting: Family Medicine

## 2012-02-19 VITALS — BP 120/76 | HR 85 | Temp 98.0°F | Wt 255.0 lb

## 2012-02-19 DIAGNOSIS — M7730 Calcaneal spur, unspecified foot: Secondary | ICD-10-CM | POA: Insufficient documentation

## 2012-02-19 DIAGNOSIS — M25572 Pain in left ankle and joints of left foot: Secondary | ICD-10-CM | POA: Insufficient documentation

## 2012-02-19 DIAGNOSIS — L089 Local infection of the skin and subcutaneous tissue, unspecified: Secondary | ICD-10-CM

## 2012-02-19 DIAGNOSIS — S92302A Fracture of unspecified metatarsal bone(s), left foot, initial encounter for closed fracture: Secondary | ICD-10-CM

## 2012-02-19 DIAGNOSIS — M25579 Pain in unspecified ankle and joints of unspecified foot: Secondary | ICD-10-CM

## 2012-02-19 DIAGNOSIS — M79672 Pain in left foot: Secondary | ICD-10-CM

## 2012-02-19 DIAGNOSIS — M79609 Pain in unspecified limb: Secondary | ICD-10-CM

## 2012-02-19 DIAGNOSIS — W19XXXA Unspecified fall, initial encounter: Secondary | ICD-10-CM | POA: Insufficient documentation

## 2012-02-19 DIAGNOSIS — S92309A Fracture of unspecified metatarsal bone(s), unspecified foot, initial encounter for closed fracture: Secondary | ICD-10-CM | POA: Insufficient documentation

## 2012-02-19 MED ORDER — CEPHALEXIN 500 MG PO CAPS: 500.0000 mg | ORAL_CAPSULE | Freq: Four times a day (QID) | ORAL | Status: AC

## 2012-02-19 MED ORDER — HYDROCODONE-ACETAMINOPHEN 5-500 MG PO TABS: 1.0000 | ORAL_TABLET | Freq: Four times a day (QID) | ORAL | Status: DC | PRN

## 2012-02-19 NOTE — Progress Notes (Signed)
  Subjective:    Patient ID: Kelly [name], female    DOB: 01-Jan-1951, 61 y.o.   MRN: [id number]  HPIpt here c/o fall 3 days ago.  She stepped off curb and pavement was not even and she fell over on R side but twisted L ankle and has wound on R elbow.  Elbow is sore but she is able to move arm with no pain.  Pt c/o pain in L foot and ankle.       Review of Systems As above    Objective:   Physical Exam  Constitutional: She is oriented to person, place, and time. She appears well-developed and well-nourished.  Musculoskeletal: She exhibits edema and tenderness.       L ankle-- + swelling and ecchymosis L foot and ankle swelling Pain Lt foot with palpation Pt has never been able to move foot or ankle well secondary to hx cva  Neurological: She is alert and oriented to person, place, and time.  Skin:       r elbow-- + 3 in wound with surrounding errythema and oozing---culture done abx ointment and bandage put in place  Psychiatric: She has a normal mood and affect. Her behavior is normal. Judgment and thought content normal.          Assessment & Plan:

## 2012-02-19 NOTE — Assessment & Plan Note (Signed)
Xray Elevate Ace Refill pain med  Rest Ortho prn

## 2012-02-19 NOTE — Assessment & Plan Note (Signed)
abx  Culture done

## 2012-02-19 NOTE — Patient Instructions (Signed)
Ankle Sprain An ankle sprain is an injury to the strong, fibrous tissues (ligaments) that hold the bones of your ankle joint together.  CAUSES Ankle sprain usually is caused by a fall or by twisting your ankle. People who participate in sports are more prone to these types of injuries.  SYMPTOMS  Symptoms of ankle sprain include:  Pain in your ankle. The pain may be present at rest or only when you are trying to stand or walk.   Swelling.   Bruising. Bruising may develop immediately or within 1 to 2 days after your injury.   Difficulty standing or walking.  DIAGNOSIS  Your caregiver will ask you details about your injury and perform a physical exam of your ankle to determine if you have an ankle sprain. During the physical exam, your caregiver will press and squeeze specific areas of your foot and ankle. Your caregiver will try to move your ankle in certain ways. An X-ray exam may be done to be sure a bone was not broken or a ligament did not separate from one of the bones in your ankle (avulsion).  TREATMENT  Certain types of braces can help stabilize your ankle. Your caregiver can make a recommendation for this. Your caregiver may recommend the use of medication for pain. If your sprain is severe, your caregiver may refer you to a surgeon who helps to restore function to parts of your skeletal system (orthopedist) or a physical therapist. HOME CARE INSTRUCTIONS  Apply ice to your injury for 1 to 2 days or as directed by your caregiver. Applying ice helps to reduce inflammation and pain.  Put ice in a plastic bag.   Place a towel between your skin and the bag.   Leave the ice on for 15 to 20 minutes at a time, every 2 hours while you are awake.   Take over-the-counter or prescription medicines for pain, discomfort, or fever only as directed by your caregiver.   Keep your injured leg elevated, when possible, to lessen swelling.   If your caregiver recommends crutches, use them as  instructed. Gradually, put weight on the affected ankle. Continue to use crutches or a cane until you can walk without feeling pain in your ankle.   If you have a plaster splint, wear the splint as directed by your caregiver. Do not rest it on anything harder than a pillow the first 24 hours. Do not put weight on it. Do not get it wet. You may take it off to take a shower or bath.   You may have been given an elastic bandage to wear around your ankle to provide support. If the elastic bandage is too tight (you have numbness or tingling in your foot or your foot becomes cold and blue), adjust the bandage to make it comfortable.   If you have an air splint, you may blow more air into it or let air out to make it more comfortable. You may take your splint off at night and before taking a shower or bath.   Wiggle your toes in the splint several times per day if you are able.  SEEK MEDICAL CARE IF:   You have an increase in bruising, swelling, or pain.   Your toes feel cold.   Pain relief is not achieved with medication.  SEEK IMMEDIATE MEDICAL CARE IF: Your toes are numb or blue or you have severe pain. MAKE SURE YOU:   Understand these instructions.   Will watch your condition.     Will get help right away if you are not doing well or get worse.  Document Released: 06/01/2005 Document Revised: 05/21/2011 Document Reviewed: 01/04/2008 [employer] Patient Information 2012 [employer]. 

## 2012-02-19 NOTE — Assessment & Plan Note (Signed)
Ace wrap Ice  Refill pain med Rest  Elevated leg Xray  To ortho prn

## 2012-02-23 LAB — WOUND CULTURE
Gram Stain: NONE SEEN
Gram Stain: NONE SEEN
Gram Stain: NONE SEEN
Organism ID, Bacteria: NO GROWTH

## 2012-02-24 ENCOUNTER — Telehealth: Payer: Self-pay

## 2012-02-24 NOTE — Telephone Encounter (Signed)
Patient says she is in a boot right now and is having some pain. She will follow up with Ortho On Friday.     KP

## 2012-02-24 NOTE — Telephone Encounter (Signed)
Message copied by Arnette Norris on Wed Feb 24, 2012  1:52 PM ------      Message from: Lelon Perla      Created: Fri Feb 19, 2012 12:20 PM       + metatarsal fracture---   Refer to ortho

## 2012-03-17 ENCOUNTER — Ambulatory Visit: Payer: Medicare Other | Admitting: *Deleted

## 2012-03-29 ENCOUNTER — Ambulatory Visit: Payer: Medicare Other | Admitting: *Deleted

## 2012-04-07 ENCOUNTER — Encounter: Payer: Self-pay | Admitting: Family Medicine

## 2012-05-12 ENCOUNTER — Other Ambulatory Visit: Payer: Self-pay | Admitting: Family Medicine

## 2012-05-20 ENCOUNTER — Encounter: Payer: Medicare Other | Admitting: Family Medicine

## 2012-06-01 ENCOUNTER — Encounter: Payer: Medicare Other | Admitting: Family Medicine

## 2012-06-27 ENCOUNTER — Encounter: Payer: Self-pay | Admitting: Family Medicine

## 2012-06-27 ENCOUNTER — Ambulatory Visit (INDEPENDENT_AMBULATORY_CARE_PROVIDER_SITE_OTHER): Payer: Medicare Other | Admitting: Family Medicine

## 2012-06-27 VITALS — BP 126/74 | HR 91 | Temp 98.2°F | Ht 64.0 in | Wt 264.6 lb

## 2012-06-27 DIAGNOSIS — E785 Hyperlipidemia, unspecified: Secondary | ICD-10-CM

## 2012-06-27 DIAGNOSIS — I10 Essential (primary) hypertension: Secondary | ICD-10-CM

## 2012-06-27 DIAGNOSIS — Z0000 Encounter for general adult medical examination without abnormal findings: Secondary | ICD-10-CM
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
## 2012-06-27 NOTE — Assessment & Plan Note (Signed)
Check labs con't meds 

## 2012-06-27 NOTE — Assessment & Plan Note (Signed)
Stable con't meds 

## 2012-06-27 NOTE — Patient Instructions (Addendum)
Preventive Care for Adults, Female A healthy lifestyle and preventive care can promote health and wellness. Preventive health guidelines for women include the following key practices.  A routine yearly physical is a good way to check with your caregiver about your health and preventive screening. It is a chance to share any concerns and updates on your health, and to receive a thorough exam.  Visit your dentist for a routine exam and preventive care every 6 months. Brush your teeth twice a day and floss once a day. Good oral hygiene prevents tooth decay and gum disease.  The frequency of eye exams is based on your age, health, family medical history, use of contact lenses, and other factors. Follow your caregiver's recommendations for frequency of eye exams.  Eat a healthy diet. Foods like vegetables, fruits, whole grains, low-fat dairy products, and lean protein foods contain the nutrients you need without too many calories. Decrease your intake of foods high in solid fats, added sugars, and salt. Eat the right amount of calories for you.Get information about a proper diet from your caregiver, if necessary.  Regular physical exercise is one of the most important things you can do for your health. Most adults should get at least 150 minutes of moderate-intensity exercise (any activity that increases your heart rate and causes you to sweat) each week. In addition, most adults need muscle-strengthening exercises on 2 or more days a week.  Maintain a healthy weight. The body mass index (BMI) is a screening tool to identify possible weight problems. It provides an estimate of body fat based on height and weight. Your caregiver can help determine your BMI, and can help you achieve or maintain a healthy weight.For adults 20 years and older:  A BMI below 18.5 is considered underweight.  A BMI of 18.5 to 24.9 is normal.  A BMI of 25 to 29.9 is considered overweight.  A BMI of 30 and above is  considered obese.  Maintain normal blood lipids and cholesterol levels by exercising and minimizing your intake of saturated fat. Eat a balanced diet with plenty of fruit and vegetables. Blood tests for lipids and cholesterol should begin at age 20 and be repeated every 5 years. If your lipid or cholesterol levels are high, you are over 50, or you are at high risk for heart disease, you may need your cholesterol levels checked more frequently.Ongoing high lipid and cholesterol levels should be treated with medicines if diet and exercise are not effective.  If you smoke, find out from your caregiver how to quit. If you do not use tobacco, do not start.  If you are pregnant, do not drink alcohol. If you are breastfeeding, be very cautious about drinking alcohol. If you are not pregnant and choose to drink alcohol, do not exceed 1 drink per day. One drink is considered to be 12 ounces (355 mL) of beer, 5 ounces (148 mL) of wine, or 1.5 ounces (44 mL) of liquor.  Avoid use of street drugs. Do not share needles with anyone. Ask for help if you need support or instructions about stopping the use of drugs.  High blood pressure causes heart disease and increases the risk of stroke. Your blood pressure should be checked at least every 1 to 2 years. Ongoing high blood pressure should be treated with medicines if weight loss and exercise are not effective.  If you are 55 to 64 years old, ask your caregiver if you should take aspirin to prevent strokes.  Diabetes   screening involves taking a blood sample to check your fasting blood sugar level. This should be done once every 3 years, after age 45, if you are within normal weight and without risk factors for diabetes. Testing should be considered at a younger age or be carried out more frequently if you are overweight and have at least 1 risk factor for diabetes.  Breast cancer screening is essential preventive care for women. You should practice "breast  self-awareness." This means understanding the normal appearance and feel of your breasts and may include breast self-examination. Any changes detected, no matter how small, should be reported to a caregiver. Women in their 20s and 30s should have a clinical breast exam (CBE) by a caregiver as part of a regular health exam every 1 to 3 years. After age 40, women should have a CBE every year. Starting at age 40, women should consider having a mammography (breast X-ray test) every year. Women who have a family history of breast cancer should talk to their caregiver about genetic screening. Women at a high risk of breast cancer should talk to their caregivers about having magnetic resonance imaging (MRI) and a mammography every year.  The Pap test is a screening test for cervical cancer. A Pap test can show cell changes on the cervix that might become cervical cancer if left untreated. A Pap test is a procedure in which cells are obtained and examined from the lower end of the uterus (cervix).  Women should have a Pap test starting at age 21.  Between ages 21 and 29, Pap tests should be repeated every 2 years.  Beginning at age 30, you should have a Pap test every 3 years as long as the past 3 Pap tests have been normal.  Some women have medical problems that increase the chance of getting cervical cancer. Talk to your caregiver about these problems. It is especially important to talk to your caregiver if a new problem develops soon after your last Pap test. In these cases, your caregiver may recommend more frequent screening and Pap tests.  The above recommendations are the same for women who have or have not gotten the vaccine for human papillomavirus (HPV).  If you had a hysterectomy for a problem that was not cancer or a condition that could lead to cancer, then you no longer need Pap tests. Even if you no longer need a Pap test, a regular exam is a good idea to make sure no other problems are  starting.  If you are between ages 65 and 70, and you have had normal Pap tests going back 10 years, you no longer need Pap tests. Even if you no longer need a Pap test, a regular exam is a good idea to make sure no other problems are starting.  If you have had past treatment for cervical cancer or a condition that could lead to cancer, you need Pap tests and screening for cancer for at least 20 years after your treatment.  If Pap tests have been discontinued, risk factors (such as a new sexual partner) need to be reassessed to determine if screening should be resumed.  The HPV test is an additional test that may be used for cervical cancer screening. The HPV test looks for the virus that can cause the cell changes on the cervix. The cells collected during the Pap test can be tested for HPV. The HPV test could be used to screen women aged 30 years and older, and should   be used in women of any age who have unclear Pap test results. After the age of 30, women should have HPV testing at the same frequency as a Pap test.  Colorectal cancer can be detected and often prevented. Most routine colorectal cancer screening begins at the age of 50 and continues through age 75. However, your caregiver may recommend screening at an earlier age if you have risk factors for colon cancer. On a yearly basis, your caregiver may provide home test kits to check for hidden blood in the stool. Use of a small camera at the end of a tube, to directly examine the colon (sigmoidoscopy or colonoscopy), can detect the earliest forms of colorectal cancer. Talk to your caregiver about this at age 50, when routine screening begins. Direct examination of the colon should be repeated every 5 to 10 years through age 75, unless early forms of pre-cancerous polyps or small growths are found.  Hepatitis C blood testing is recommended for all people born from 1945 through 1965 and any individual with known risks for hepatitis C.  Practice  safe sex. Use condoms and avoid high-risk sexual practices to reduce the spread of sexually transmitted infections (STIs). STIs include gonorrhea, chlamydia, syphilis, trichomonas, herpes, HPV, and human immunodeficiency virus (HIV). Herpes, HIV, and HPV are viral illnesses that have no cure. They can result in disability, cancer, and death. Sexually active women aged 25 and younger should be checked for chlamydia. Older women with new or multiple partners should also be tested for chlamydia. Testing for other STIs is recommended if you are sexually active and at increased risk.  Osteoporosis is a disease in which the bones lose minerals and strength with aging. This can result in serious bone fractures. The risk of osteoporosis can be identified using a bone density scan. Women ages 65 and over and women at risk for fractures or osteoporosis should discuss screening with their caregivers. Ask your caregiver whether you should take a calcium supplement or vitamin D to reduce the rate of osteoporosis.  Menopause can be associated with physical symptoms and risks. Hormone replacement therapy is available to decrease symptoms and risks. You should talk to your caregiver about whether hormone replacement therapy is right for you.  Use sunscreen with sun protection factor (SPF) of 30 or more. Apply sunscreen liberally and repeatedly throughout the day. You should seek shade when your shadow is shorter than you. Protect yourself by wearing long sleeves, pants, a wide-brimmed hat, and sunglasses year round, whenever you are outdoors.  Once a month, do a whole body skin exam, using a mirror to look at the skin on your back. Notify your caregiver of new moles, moles that have irregular borders, moles that are larger than a pencil eraser, or moles that have changed in shape or color.  Stay current with required immunizations.  Influenza. You need a dose every fall (or winter). The composition of the flu vaccine  changes each year, so being vaccinated once is not enough.  Pneumococcal polysaccharide. You need 1 to 2 doses if you smoke cigarettes or if you have certain chronic medical conditions. You need 1 dose at age 65 (or older) if you have never been vaccinated.  Tetanus, diphtheria, pertussis (Tdap, Td). Get 1 dose of Tdap vaccine if you are younger than age 65, are over 65 and have contact with an infant, are a healthcare worker, are pregnant, or simply want to be protected from whooping cough. After that, you need a Td   booster dose every 10 years. Consult your caregiver if you have not had at least 3 tetanus and diphtheria-containing shots sometime in your life or have a deep or dirty wound.  HPV. You need this vaccine if you are a woman age 26 or younger. The vaccine is given in 3 doses over 6 months.  Measles, mumps, rubella (MMR). You need at least 1 dose of MMR if you were born in 1957 or later. You may also need a second dose.  Meningococcal. If you are age 19 to 21 and a first-year college student living in a residence hall, or have one of several medical conditions, you need to get vaccinated against meningococcal disease. You may also need additional booster doses.  Zoster (shingles). If you are age 60 or older, you should get this vaccine.  Varicella (chickenpox). If you have never had chickenpox or you were vaccinated but received only 1 dose, talk to your caregiver to find out if you need this vaccine.  Hepatitis A. You need this vaccine if you have a specific risk factor for hepatitis A virus infection or you simply wish to be protected from this disease. The vaccine is usually given as 2 doses, 6 to 18 months apart.  Hepatitis B. You need this vaccine if you have a specific risk factor for hepatitis B virus infection or you simply wish to be protected from this disease. The vaccine is given in 3 doses, usually over 6 months. Preventive Services / Frequency Ages 19 to 39  Blood  pressure check.** / Every 1 to 2 years.  Lipid and cholesterol check.** / Every 5 years beginning at age 20.  Clinical breast exam.** / Every 3 years for women in their 20s and 30s.  Pap test.** / Every 2 years from ages 21 through 29. Every 3 years starting at age 30 through age 65 or 70 with a history of 3 consecutive normal Pap tests.  HPV screening.** / Every 3 years from ages 30 through ages 65 to 70 with a history of 3 consecutive normal Pap tests.  Hepatitis C blood test.** / For any individual with known risks for hepatitis C.  Skin self-exam. / Monthly.  Influenza immunization.** / Every year.  Pneumococcal polysaccharide immunization.** / 1 to 2 doses if you smoke cigarettes or if you have certain chronic medical conditions.  Tetanus, diphtheria, pertussis (Tdap, Td) immunization. / A one-time dose of Tdap vaccine. After that, you need a Td booster dose every 10 years.  HPV immunization. / 3 doses over 6 months, if you are 26 and younger.  Measles, mumps, rubella (MMR) immunization. / You need at least 1 dose of MMR if you were born in 1957 or later. You may also need a second dose.  Meningococcal immunization. / 1 dose if you are age 19 to 21 and a first-year college student living in a residence hall, or have one of several medical conditions, you need to get vaccinated against meningococcal disease. You may also need additional booster doses.  Varicella immunization.** / Consult your caregiver.  Hepatitis A immunization.** / Consult your caregiver. 2 doses, 6 to 18 months apart.  Hepatitis B immunization.** / Consult your caregiver. 3 doses usually over 6 months. Ages 40 to 64  Blood pressure check.** / Every 1 to 2 years.  Lipid and cholesterol check.** / Every 5 years beginning at age 20.  Clinical breast exam.** / Every year after age 40.  Mammogram.** / Every year beginning at age 40   and continuing for as long as you are in good health. Consult with your  caregiver.  Pap test.** / Every 3 years starting at age 30 through age 65 or 70 with a history of 3 consecutive normal Pap tests.  HPV screening.** / Every 3 years from ages 30 through ages 65 to 70 with a history of 3 consecutive normal Pap tests.  Fecal occult blood test (FOBT) of stool. / Every year beginning at age 50 and continuing until age 75. You may not need to do this test if you get a colonoscopy every 10 years.  Flexible sigmoidoscopy or colonoscopy.** / Every 5 years for a flexible sigmoidoscopy or every 10 years for a colonoscopy beginning at age 50 and continuing until age 75.  Hepatitis C blood test.** / For all people born from 1945 through 1965 and any individual with known risks for hepatitis C.  Skin self-exam. / Monthly.  Influenza immunization.** / Every year.  Pneumococcal polysaccharide immunization.** / 1 to 2 doses if you smoke cigarettes or if you have certain chronic medical conditions.  Tetanus, diphtheria, pertussis (Tdap, Td) immunization.** / A one-time dose of Tdap vaccine. After that, you need a Td booster dose every 10 years.  Measles, mumps, rubella (MMR) immunization. / You need at least 1 dose of MMR if you were born in 1957 or later. You may also need a second dose.  Varicella immunization.** / Consult your caregiver.  Meningococcal immunization.** / Consult your caregiver.  Hepatitis A immunization.** / Consult your caregiver. 2 doses, 6 to 18 months apart.  Hepatitis B immunization.** / Consult your caregiver. 3 doses, usually over 6 months. Ages 65 and over  Blood pressure check.** / Every 1 to 2 years.  Lipid and cholesterol check.** / Every 5 years beginning at age 20.  Clinical breast exam.** / Every year after age 40.  Mammogram.** / Every year beginning at age 40 and continuing for as long as you are in good health. Consult with your caregiver.  Pap test.** / Every 3 years starting at age 30 through age 65 or 70 with a 3  consecutive normal Pap tests. Testing can be stopped between 65 and 70 with 3 consecutive normal Pap tests and no abnormal Pap or HPV tests in the past 10 years.  HPV screening.** / Every 3 years from ages 30 through ages 65 or 70 with a history of 3 consecutive normal Pap tests. Testing can be stopped between 65 and 70 with 3 consecutive normal Pap tests and no abnormal Pap or HPV tests in the past 10 years.  Fecal occult blood test (FOBT) of stool. / Every year beginning at age 50 and continuing until age 75. You may not need to do this test if you get a colonoscopy every 10 years.  Flexible sigmoidoscopy or colonoscopy.** / Every 5 years for a flexible sigmoidoscopy or every 10 years for a colonoscopy beginning at age 50 and continuing until age 75.  Hepatitis C blood test.** / For all people born from 1945 through 1965 and any individual with known risks for hepatitis C.  Osteoporosis screening.** / A one-time screening for women ages 65 and over and women at risk for fractures or osteoporosis.  Skin self-exam. / Monthly.  Influenza immunization.** / Every year.  Pneumococcal polysaccharide immunization.** / 1 dose at age 65 (or older) if you have never been vaccinated.  Tetanus, diphtheria, pertussis (Tdap, Td) immunization. / A one-time dose of Tdap vaccine if you are over   65 and have contact with an infant, are a healthcare worker, or simply want to be protected from whooping cough. After that, you need a Td booster dose every 10 years.  Varicella immunization.** / Consult your caregiver.  Meningococcal immunization.** / Consult your caregiver.  Hepatitis A immunization.** / Consult your caregiver. 2 doses, 6 to 18 months apart.  Hepatitis B immunization.** / Check with your caregiver. 3 doses, usually over 6 months. ** Family history and personal history of risk and conditions may change your caregiver's recommendations. Document Released: 07/28/2001 Document Revised: 08/24/2011  Document Reviewed: 10/27/2010 [employer] Patient Information 2013 [employer].  

## 2012-07-09 ENCOUNTER — Other Ambulatory Visit: Payer: Self-pay | Admitting: Family Medicine

## 2012-08-02 ENCOUNTER — Other Ambulatory Visit: Payer: Medicare Other

## 2012-08-03 ENCOUNTER — Encounter: Payer: Self-pay | Admitting: Lab

## 2012-08-04 ENCOUNTER — Other Ambulatory Visit (INDEPENDENT_AMBULATORY_CARE_PROVIDER_SITE_OTHER): Payer: Medicare Other

## 2012-08-04 DIAGNOSIS — E785 Hyperlipidemia, unspecified: Secondary | ICD-10-CM

## 2012-08-04 DIAGNOSIS — I10 Essential (primary) hypertension: Secondary | ICD-10-CM

## 2012-08-04 LAB — BASIC METABOLIC PANEL
BUN: 12 mg/dL (ref 6–23)
Calcium: 8.4 mg/dL (ref 8.4–10.5)
GFR: 132.27 mL/min (ref >60.00)
Potassium: 3.9 mEq/L (ref 3.5–5.1)
Sodium: 140 mEq/L (ref 135–145)

## 2012-08-04 LAB — CBC WITH DIFFERENTIAL/PLATELET
Basophils Absolute: 0 10*3/uL (ref 0.0–0.1)
Eosinophils Relative: 1.1 % (ref 0.0–5.0)
HCT: 34.8 % — ABNORMAL LOW (ref 36.0–46.0)
Hemoglobin: 11.3 g/dL — ABNORMAL LOW (ref 12.0–15.0)
Lymphocytes Relative: 22.7 % (ref 12.0–46.0)
Lymphs Abs: 1.6 10*3/uL (ref 0.7–4.0)
Monocytes Relative: 4.9 % (ref 3.0–12.0)
Platelets: 192 10*3/uL (ref 150.0–400.0)
RDW: 16.4 % — ABNORMAL HIGH (ref 11.5–14.6)
WBC: 7 10*3/uL (ref 4.5–10.5)

## 2012-08-04 LAB — LIPID PANEL
Cholesterol: 115 mg/dL (ref 0–200)
LDL Cholesterol: 56 mg/dL (ref 0–99)
Total CHOL/HDL Ratio: 3
VLDL: 25.8 mg/dL (ref 0.0–40.0)

## 2012-08-04 LAB — HEPATIC FUNCTION PANEL
ALT: 14 U/L (ref 0–35)
AST: 15 U/L (ref 0–37)
Alkaline Phosphatase: 114 U/L (ref 39–117)
Total Bilirubin: 1 mg/dL (ref 0.3–1.2)

## 2012-08-04 LAB — URINALYSIS, ROUTINE W REFLEX MICROSCOPIC
Nitrite: NEGATIVE
Total Protein, Urine: NEGATIVE
pH: 5.5 (ref 5.0–8.0)

## 2012-08-04 LAB — TSH: TSH: 3.09 u[IU]/mL (ref 0.35–5.50)

## 2012-08-05 ENCOUNTER — Other Ambulatory Visit: Payer: Medicare Other

## 2012-08-18 ENCOUNTER — Other Ambulatory Visit: Payer: Self-pay | Admitting: Family Medicine

## 2012-09-07 ENCOUNTER — Encounter: Payer: Self-pay | Admitting: Family Medicine

## 2012-11-05 ENCOUNTER — Other Ambulatory Visit: Payer: Self-pay | Admitting: Family Medicine

## 2012-11-08 NOTE — Telephone Encounter (Signed)
Med filled.  

## 2012-11-15 ENCOUNTER — Other Ambulatory Visit: Payer: Self-pay

## 2012-11-15 DIAGNOSIS — Z1231 Encounter for screening mammogram for malignant neoplasm of breast: Secondary | ICD-10-CM

## 2012-12-14 ENCOUNTER — Ambulatory Visit
Admission: RE | Admit: 2012-12-14 | Discharge: 2012-12-14 | Disposition: A | Discharge status: Home / Self Care | Payer: Medicare Other | Source: Ambulatory Visit

## 2012-12-14 DIAGNOSIS — Z1231 Encounter for screening mammogram for malignant neoplasm of breast: Secondary | ICD-10-CM

## 2013-02-10 ENCOUNTER — Other Ambulatory Visit: Payer: Self-pay | Admitting: Family Medicine

## 2013-03-15 ENCOUNTER — Ambulatory Visit (INDEPENDENT_AMBULATORY_CARE_PROVIDER_SITE_OTHER): Payer: Medicare Other

## 2013-03-15 DIAGNOSIS — Z23 Encounter for immunization: Secondary | ICD-10-CM

## 2013-04-04 ENCOUNTER — Ambulatory Visit (HOSPITAL_BASED_OUTPATIENT_CLINIC_OR_DEPARTMENT_OTHER)
Admission: RE | Admit: 2013-04-04 | Discharge: 2013-04-04 | Disposition: A | Discharge status: Home / Self Care | Payer: Medicare Other | Source: Ambulatory Visit | Attending: Family Medicine | Admitting: Family Medicine

## 2013-04-04 ENCOUNTER — Ambulatory Visit (INDEPENDENT_AMBULATORY_CARE_PROVIDER_SITE_OTHER): Payer: Medicare Other | Admitting: Family Medicine

## 2013-04-04 ENCOUNTER — Encounter: Payer: Self-pay | Admitting: Family Medicine

## 2013-04-04 VITALS — BP 118/80 | HR 83 | Temp 98.2°F | Resp 16 | Wt 265.2 lb

## 2013-04-04 DIAGNOSIS — M25559 Pain in unspecified hip: Secondary | ICD-10-CM

## 2013-04-04 DIAGNOSIS — M545 Low back pain, unspecified: Secondary | ICD-10-CM | POA: Insufficient documentation

## 2013-04-04 DIAGNOSIS — M25551 Pain in right hip: Secondary | ICD-10-CM

## 2013-04-04 DIAGNOSIS — Q762 Congenital spondylolisthesis: Secondary | ICD-10-CM | POA: Insufficient documentation

## 2013-04-04 DIAGNOSIS — E785 Hyperlipidemia, unspecified: Secondary | ICD-10-CM

## 2013-04-04 DIAGNOSIS — R29890 Loss of height: Secondary | ICD-10-CM | POA: Insufficient documentation

## 2013-04-04 MED ORDER — CYCLOBENZAPRINE HCL 10 MG PO TABS: 10.0000 mg | ORAL_TABLET | Freq: Three times a day (TID) | ORAL | Status: DC | PRN

## 2013-04-04 MED ORDER — ATORVASTATIN CALCIUM 20 MG PO TABS: ORAL_TABLET | ORAL | Status: DC

## 2013-04-04 MED ORDER — HYDROCODONE-ACETAMINOPHEN 5-325 MG PO TABS: 1.0000 | ORAL_TABLET | Freq: Four times a day (QID) | ORAL | Status: DC | PRN

## 2013-04-04 NOTE — Progress Notes (Signed)
  Subjective:    PAULEEN GOLEMAN is a 62 y.o. female who presents for evaluation of low back pain. The patient has had recurrent self limited episodes of low back pain in the past. Symptoms have been present for several days and are unchanged.  Onset was related to / precipitated by no known injury. The pain is located in the across the lower back and radiates to the right thigh. The pain is described as sharp and occurs all day. She rates her pain as severe. Symptoms are exacerbated by sitting, standing and walking. Symptoms are improved by nothing. She has also tried nothing which provided no symptom relief. She has weakness in the right leg associated with the back pain. The patient has no "red flag" history indicative of complicated back pain.  The following portions of the patient's history were reviewed and updated as appropriate: allergies, current medications, past family history, past medical history, past social history, past surgical history and problem list.  Review of Systems Pertinent items are noted in HPI.    Objective:   Inspection and palpation: paraspinal tenderness noted low back, antalgic gait. Muscle tone and ROM exam: muscle spasm noted low back, limited range of motion with pain, antalgic gait. Neurological: pt has L hemplegia secondary to stroke,  R leg + pain in back of thigh with swelling.    Assessment:    Sciatica possibly due to degenerative joint disease at intervertebral facet joints    Plan:    Short (2-4 day) period of relative rest recommended until acute symptoms improve. Ice to affected area as needed for local pain relief. Heat to affected area as needed for local pain relief. Muscle relaxants per medication orders. Follow-up in 2 weeks.  Doppler L leg-- r/o dvt

## 2013-04-04 NOTE — Patient Instructions (Signed)
Back Pain, Adult  Low back pain is very common. About 1 in 5 people have back pain. The cause of low back pain is rarely dangerous. The pain often gets better over time. About half of people with a sudden onset of back pain feel better in just 2 weeks. About 8 in 10 people feel better by 6 weeks.   CAUSES  Some common causes of back pain include:  · Strain of the muscles or ligaments supporting the spine.  · Wear and tear (degeneration) of the spinal discs.  · Arthritis.  · Direct injury to the back.  DIAGNOSIS  Most of the time, the direct cause of low back pain is not known. However, back pain can be treated effectively even when the exact cause of the pain is unknown. Answering your caregiver's questions about your overall health and symptoms is one of the most accurate ways to make sure the cause of your pain is not dangerous. If your caregiver needs more information, he or she may order lab work or imaging tests (X-rays or MRIs). However, even if imaging tests show changes in your back, this usually does not require surgery.  HOME CARE INSTRUCTIONS  For many people, back pain returns. Since low back pain is rarely dangerous, it is often a condition that people can learn to manage on their own.   · Remain active. It is stressful on the back to sit or stand in one place. Do not sit, drive, or stand in one place for more than 30 minutes at a time. Take short walks on level surfaces as soon as pain allows. Try to increase the length of time you walk each day.  · Do not stay in bed. Resting more than 1 or 2 days can delay your recovery.  · Do not avoid exercise or work. Your body is made to move. It is not dangerous to be active, even though your back may hurt. Your back will likely heal faster if you return to being active before your pain is gone.  · Pay attention to your body when you  bend and lift. Many people have less discomfort when lifting if they bend their knees, keep the load close to their bodies, and  avoid twisting. Often, the most comfortable positions are those that put less stress on your recovering back.  · Find a comfortable position to sleep. Use a firm mattress and lie on your side with your knees slightly bent. If you lie on your back, put a pillow under your knees.  · Only take over-the-counter or prescription medicines as directed by your caregiver. Over-the-counter medicines to reduce pain and inflammation are often the most helpful. Your caregiver may prescribe muscle relaxant drugs. These medicines help dull your pain so you can more quickly return to your normal activities and healthy exercise.  · Put ice on the injured area.  · Put ice in a plastic bag.  · Place a towel between your skin and the bag.  · Leave the ice on for 15-20 minutes, 3-4 times a day for the first 2 to 3 days. After that, ice and heat may be alternated to reduce pain and spasms.  · Ask your caregiver about trying back exercises and gentle massage. This may be of some benefit.  · Avoid feeling anxious or stressed. Stress increases muscle tension and can worsen back pain. It is important to recognize when you are anxious or stressed and learn ways to manage it. Exercise is a great option.  SEEK MEDICAL CARE IF:  · You have pain that is not relieved with rest or   medicine.  · You have pain that does not improve in 1 week.  · You have new symptoms.  · You are generally not feeling well.  SEEK IMMEDIATE MEDICAL CARE IF:   · You have pain that radiates from your back into your legs.  · You develop new bowel or bladder control problems.  · You have unusual weakness or numbness in your arms or legs.  · You develop nausea or vomiting.  · You develop abdominal pain.  · You feel faint.  Document Released: 06/01/2005 Document Revised: 12/01/2011 Document Reviewed: 10/20/2010  [employer]® Patient Information ©2014 [employer].

## 2013-04-13 ENCOUNTER — Other Ambulatory Visit (INDEPENDENT_AMBULATORY_CARE_PROVIDER_SITE_OTHER): Payer: Medicare Other

## 2013-04-13 DIAGNOSIS — I10 Essential (primary) hypertension: Secondary | ICD-10-CM

## 2013-04-13 DIAGNOSIS — E785 Hyperlipidemia, unspecified: Secondary | ICD-10-CM

## 2013-04-14 LAB — HEPATIC FUNCTION PANEL
ALT: 26 U/L (ref 0–35)
Bilirubin, Direct: 0.1 mg/dL (ref 0.0–0.3)
Total Bilirubin: 0.9 mg/dL (ref 0.3–1.2)
Total Protein: 7.3 g/dL (ref 6.0–8.3)

## 2013-04-14 LAB — LIPID PANEL
Cholesterol: 127 mg/dL (ref 0–200)
HDL: 39.5 mg/dL (ref >39.00)

## 2013-04-14 LAB — BASIC METABOLIC PANEL
BUN: 11 mg/dL (ref 6–23)
CO2: 29 mEq/L (ref 19–32)
GFR: 131.98 mL/min (ref >60.00)
Glucose, Bld: 58 mg/dL — ABNORMAL LOW (ref 70–99)
Potassium: 4.3 mEq/L (ref 3.5–5.1)
Sodium: 140 mEq/L (ref 135–145)

## 2013-04-29 ENCOUNTER — Other Ambulatory Visit: Payer: Self-pay | Admitting: Family Medicine

## 2013-05-15 ENCOUNTER — Other Ambulatory Visit: Payer: Self-pay | Admitting: Family Medicine

## 2013-05-17 ENCOUNTER — Encounter: Payer: Self-pay | Admitting: Internal Medicine

## 2013-05-17 ENCOUNTER — Ambulatory Visit (INDEPENDENT_AMBULATORY_CARE_PROVIDER_SITE_OTHER): Payer: Medicare Other | Admitting: Internal Medicine

## 2013-05-17 VITALS — BP 149/84 | HR 97 | Temp 98.1°F | Wt 278.0 lb

## 2013-05-17 DIAGNOSIS — S90851A Superficial foreign body, right foot, initial encounter: Secondary | ICD-10-CM

## 2013-05-17 DIAGNOSIS — IMO0002 Reserved for concepts with insufficient information to code with codable children: Secondary | ICD-10-CM

## 2013-05-17 MED ORDER — DOXYCYCLINE HYCLATE 100 MG PO TABS: 100.0000 mg | ORAL_TABLET | Freq: Two times a day (BID) | ORAL | Status: DC
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
## 2013-05-17 NOTE — Patient Instructions (Signed)
Talk to [place] before you leave the office Take antibiotics as recommended, if not improving in few days or the area gets more red and swollen --->  let us know

## 2013-05-17 NOTE — Progress Notes (Signed)
Pre visit review using our clinic review tool, if applicable. No additional management support is needed unless otherwise documented below in the visit note. 

## 2013-06-29 ENCOUNTER — Encounter: Payer: Medicare Other | Admitting: Family Medicine

## 2013-07-20 ENCOUNTER — Telehealth: Payer: Self-pay

## 2013-07-20 NOTE — Telephone Encounter (Signed)
Medication and allergies:  Reviewed and updated  90 day supply/mail order: n/a Local pharmacy:  [facility] on [address]    Immunizations due:  UTD   A/P: No changes to personal, family history or past surgical hx PAP- 10/23/09-normal CCS-06/27/10- benign polyps MMG- 01/02/13 BD- 10/07/09-low bone mass Flu- 03/15/13 Tdap- 04/27/08 Shingles- 05/19/10  To Discuss with Provider: Not at this time.

## 2013-07-24 ENCOUNTER — Encounter: Payer: Self-pay | Admitting: Family Medicine

## 2013-07-24 ENCOUNTER — Ambulatory Visit (INDEPENDENT_AMBULATORY_CARE_PROVIDER_SITE_OTHER): Payer: PRIVATE HEALTH INSURANCE | Admitting: Family Medicine

## 2013-07-24 VITALS — BP 135/64 | HR 94 | Temp 97.7°F | Resp 16 | Ht 61.0 in | Wt 276.0 lb

## 2013-07-24 DIAGNOSIS — E669 Obesity, unspecified: Secondary | ICD-10-CM | POA: Insufficient documentation

## 2013-07-24 DIAGNOSIS — E785 Hyperlipidemia, unspecified: Secondary | ICD-10-CM

## 2013-07-24 DIAGNOSIS — R739 Hyperglycemia, unspecified: Secondary | ICD-10-CM

## 2013-07-24 DIAGNOSIS — N6459 Other signs and symptoms in breast: Secondary | ICD-10-CM

## 2013-07-24 DIAGNOSIS — M545 Low back pain, unspecified: Secondary | ICD-10-CM

## 2013-07-24 DIAGNOSIS — R7309 Other abnormal glucose: Secondary | ICD-10-CM

## 2013-07-24 DIAGNOSIS — Z0000 Encounter for general adult medical examination without abnormal findings: Secondary | ICD-10-CM

## 2013-07-24 DIAGNOSIS — M79604 Pain in right leg: Secondary | ICD-10-CM

## 2013-07-24 LAB — CBC WITH DIFFERENTIAL/PLATELET
Basophils Absolute: 0 10*3/uL (ref 0.0–0.1)
Basophils Relative: 0.4 % (ref 0.0–3.0)
Eosinophils Absolute: 0.1 10*3/uL (ref 0.0–0.7)
Eosinophils Relative: 1.1 % (ref 0.0–5.0)
HEMATOCRIT: 37 % (ref 36.0–46.0)
Hemoglobin: 11.9 g/dL — ABNORMAL LOW (ref 12.0–15.0)
LYMPHS ABS: 2.2 10*3/uL (ref 0.7–4.0)
Lymphocytes Relative: 26.4 % (ref 12.0–46.0)
MCHC: 32.1 g/dL (ref 30.0–36.0)
MCV: 86.6 fl (ref 78.0–100.0)
Monocytes Absolute: 0.4 10*3/uL (ref 0.1–1.0)
Monocytes Relative: 4.7 % (ref 3.0–12.0)
Neutro Abs: 5.6 10*3/uL (ref 1.4–7.7)
Neutrophils Relative %: 67.4 % (ref 43.0–77.0)
Platelets: 228 10*3/uL (ref 150.0–400.0)
RBC: 4.27 Mil/uL (ref 3.87–5.11)
RDW: 17.2 % — ABNORMAL HIGH (ref 11.5–14.6)
WBC: 8.4 10*3/uL (ref 4.5–10.5)

## 2013-07-24 LAB — HEPATIC FUNCTION PANEL
ALT: 16 U/L (ref 0–35)
AST: 19 U/L (ref 0–37)
Albumin: 3.7 g/dL (ref 3.5–5.2)
Alkaline Phosphatase: 128 U/L — ABNORMAL HIGH (ref 39–117)
Bilirubin, Direct: 0.1 mg/dL (ref 0.0–0.3)
Total Bilirubin: 1 mg/dL (ref 0.3–1.2)
Total Protein: 7.6 g/dL (ref 6.0–8.3)

## 2013-07-24 LAB — BASIC METABOLIC PANEL
BUN: 10 mg/dL (ref 6–23)
CO2: 25 mEq/L (ref 19–32)
Calcium: 9.4 mg/dL (ref 8.4–10.5)
Chloride: 106 mEq/L (ref 96–112)
Creatinine, Ser: 0.5 mg/dL (ref 0.4–1.2)
GFR: 128.89 mL/min (ref >60.00)
Glucose, Bld: 99 mg/dL (ref 70–99)
POTASSIUM: 4.5 meq/L (ref 3.5–5.1)
SODIUM: 142 meq/L (ref 135–145)

## 2013-07-24 LAB — POCT URINALYSIS DIPSTICK
BILIRUBIN UA: NEGATIVE
Blood, UA: NEGATIVE
GLUCOSE UA: NEGATIVE
Ketones, UA: NEGATIVE
Leukocytes, UA: NEGATIVE
NITRITE UA: NEGATIVE
Protein, UA: NEGATIVE
Urobilinogen, UA: 0.2
pH, UA: 6

## 2013-07-24 LAB — HEMOGLOBIN A1C: Hgb A1c MFr Bld: 5.2 % (ref 4.6–6.5)

## 2013-07-24 LAB — LIPID PANEL
CHOLESTEROL: 130 mg/dL (ref 0–200)
HDL: 36.2 mg/dL — ABNORMAL LOW (ref >39.00)
LDL Cholesterol: 73 mg/dL (ref 0–99)
TRIGLYCERIDES: 104 mg/dL (ref 0.0–149.0)
Total CHOL/HDL Ratio: 4
VLDL: 20.8 mg/dL (ref 0.0–40.0)

## 2013-07-24 MED ORDER — HYDROCODONE-ACETAMINOPHEN 5-325 MG PO TABS: 1.0000 | ORAL_TABLET | Freq: Four times a day (QID) | ORAL | Status: DC | PRN

## 2013-07-24 MED ORDER — CYCLOBENZAPRINE HCL 10 MG PO TABS: 10.0000 mg | ORAL_TABLET | Freq: Three times a day (TID) | ORAL | Status: DC | PRN

## 2013-07-24 MED ORDER — ATORVASTATIN CALCIUM 20 MG PO TABS: 20.0000 mg | ORAL_TABLET | Freq: Every day | ORAL | Status: DC

## 2013-07-24 NOTE — Progress Notes (Signed)
Pre visit review using our clinic review tool, if applicable. No additional management support is needed unless otherwise documented below in the visit note. 
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
## 2013-07-24 NOTE — Patient Instructions (Signed)

## 2013-07-26 ENCOUNTER — Telehealth: Payer: Self-pay | Admitting: *Deleted

## 2013-07-26 NOTE — Telephone Encounter (Signed)
Patient called and would like to know if a [facility] was [facility] for her. She had a CPE on 07/24/13. Please advise. JG//CMA

## 2013-07-27 ENCOUNTER — Other Ambulatory Visit: Payer: Self-pay | Admitting: Family Medicine

## 2013-07-27 ENCOUNTER — Other Ambulatory Visit (INDEPENDENT_AMBULATORY_CARE_PROVIDER_SITE_OTHER): Payer: PRIVATE HEALTH INSURANCE

## 2013-07-27 DIAGNOSIS — R7402 Elevation of levels of lactic acid dehydrogenase (LDH): Secondary | ICD-10-CM

## 2013-07-27 DIAGNOSIS — R740 Nonspecific elevation of levels of transaminase and lactic acid dehydrogenase [LDH]: Principal | ICD-10-CM

## 2013-07-27 LAB — GAMMA GT: GGT: 21 U/L (ref 7–51)

## 2013-07-27 LAB — ALKALINE PHOSPHATASE: ALK PHOS: 122 U/L — AB (ref 39–117)

## 2013-07-27 NOTE — Addendum Note (Signed)
Addended by: Modena Morrow D on: 07/27/2013 03:13 PM   Modules accepted: Orders

## 2013-07-27 NOTE — Telephone Encounter (Signed)
Attempted to call pt back to make aware that referral has been placed. Left message on voice mail that she would in the upcoming days with an appt date and time for mammo

## 2013-08-03 LAB — ALKALINE PHOSPHATASE ISOENZYMES
Alkaline Phonsphatase: 138 U/L — ABNORMAL HIGH (ref 33–130)
Bone Isoenzymes: 19 % — ABNORMAL LOW (ref 28–66)
INTESTINAL ISOENZYMES (ALP ISO): 0 % — AB (ref 1–24)
Liver Isoenzymes: 81 % — ABNORMAL HIGH (ref 25–69)
MACROHEPATIC ISOENZYMES: 0 %

## 2013-08-07 ENCOUNTER — Other Ambulatory Visit: Payer: Self-pay | Admitting: Family Medicine

## 2013-08-07 ENCOUNTER — Telehealth: Payer: Self-pay

## 2013-08-07 DIAGNOSIS — R7989 Other specified abnormal findings of blood chemistry: Secondary | ICD-10-CM

## 2013-08-07 DIAGNOSIS — R945 Abnormal results of liver function studies: Principal | ICD-10-CM

## 2013-08-07 DIAGNOSIS — R748 Abnormal levels of other serum enzymes: Secondary | ICD-10-CM

## 2013-08-07 DIAGNOSIS — N6459 Other signs and symptoms in breast: Secondary | ICD-10-CM

## 2013-08-07 NOTE — Telephone Encounter (Signed)
Message copied by Ewing Schlein on Mon Aug 07, 2013  1:47 PM ------      Message from: Rosalita Chessman      Created: Fri Aug 04, 2013 12:34 PM       [place] abd -- secondary to elevated alk pho ------

## 2013-08-07 NOTE — Telephone Encounter (Signed)
Discussed with patient and she voiced understanding. [place] ordered    KP

## 2013-08-08 ENCOUNTER — Ambulatory Visit (HOSPITAL_BASED_OUTPATIENT_CLINIC_OR_DEPARTMENT_OTHER)
Admission: RE | Admit: 2013-08-08 | Discharge: 2013-08-08 | Disposition: A | Discharge status: Home / Self Care | Payer: PRIVATE HEALTH INSURANCE | Source: Ambulatory Visit | Attending: Family Medicine | Admitting: Family Medicine

## 2013-08-08 DIAGNOSIS — R748 Abnormal levels of other serum enzymes: Secondary | ICD-10-CM

## 2013-08-08 DIAGNOSIS — R945 Abnormal results of liver function studies: Secondary | ICD-10-CM | POA: Insufficient documentation

## 2013-08-08 DIAGNOSIS — K7689 Other specified diseases of liver: Secondary | ICD-10-CM | POA: Insufficient documentation

## 2013-08-08 DIAGNOSIS — R7989 Other specified abnormal findings of blood chemistry: Secondary | ICD-10-CM

## 2013-08-09 ENCOUNTER — Other Ambulatory Visit: Payer: Self-pay | Admitting: Family Medicine

## 2013-08-09 ENCOUNTER — Telehealth: Payer: Self-pay | Admitting: Family Medicine

## 2013-08-09 DIAGNOSIS — K589 Irritable bowel syndrome without diarrhea: Secondary | ICD-10-CM

## 2013-08-09 NOTE — Telephone Encounter (Signed)
Patient called to find out results of abdominal ultrasound. She states she is very concerned. CB# [phone]

## 2013-08-09 NOTE — Telephone Encounter (Signed)
Fatty liver--- diet/ weight loss should help this    Patient has been aware and voiced understanding. She is still having the abdominal pain after eating, with diarrhea and constipation.     KP

## 2013-08-09 NOTE — Telephone Encounter (Signed)
Referral for GI put in

## 2013-08-15 ENCOUNTER — Ambulatory Visit: Payer: PRIVATE HEALTH INSURANCE | Admitting: Internal Medicine

## 2013-08-17 ENCOUNTER — Ambulatory Visit
Admission: RE | Admit: 2013-08-17 | Discharge: 2013-08-17 | Disposition: A | Discharge status: Home / Self Care | Payer: PRIVATE HEALTH INSURANCE | Source: Ambulatory Visit | Attending: Family Medicine | Admitting: Family Medicine

## 2013-08-17 ENCOUNTER — Other Ambulatory Visit: Payer: Self-pay | Admitting: Family Medicine

## 2013-08-17 DIAGNOSIS — N6459 Other signs and symptoms in breast: Secondary | ICD-10-CM

## 2013-09-13 ENCOUNTER — Encounter: Payer: Self-pay | Admitting: Family Medicine

## 2013-10-19 ENCOUNTER — Telehealth: Payer: Self-pay | Admitting: Family Medicine

## 2013-10-19 NOTE — Telephone Encounter (Signed)
Spoke with the patient and is wanting a new power chair. 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 I made her aware we would need to have her come to the office along with someone for from [facility] to get her power chair approved. She voiced understanding and agreed.     KP

## 2013-10-19 NOTE — Telephone Encounter (Signed)
To MD for review     KP 

## 2013-10-19 NOTE — Telephone Encounter (Signed)
MSG left to call the office      KP 

## 2013-10-19 NOTE — Telephone Encounter (Signed)
Caller name:Kelly [name] Relation to pt: patient Call back number: [phone] Pharmacy:  Reason for call: patient called stating that she spoke with dr Etter Sjogren at her last office visit regarding repairing her wheel chair or possibly getting a new one.  Patient states that she has not heard anything back from [place]. Please advise.

## 2013-10-19 NOTE — Telephone Encounter (Signed)
Ok to give her rx for new one---  We never got any info from med supply co about supplies to repair

## 2013-10-20 NOTE — Telephone Encounter (Signed)
Caller name:Aralyn Bomba  Relation to GN:[id number] Call back number:[phone]  Pharmacy:  Reason for call: Patient called and stated that she would also like a hemi walker and a shower seat. Patient did call her insurance company and they stated all they need is an order faxed over.  Please advise  [facility]- fax number [phone]

## 2013-10-23 NOTE — Telephone Encounter (Addendum)
Spoke with Jackelyn Poling and she wanted to have the patient's office notes, demo and insurance info sent to review and they will contact the patient to schedule.    KP Detailed message left advising the patient that she will be notified by [facility] when that have reviewed her records and we will schedule the appointment at that time, requested a cal back if she has any questions.     KP

## 2013-10-24 ENCOUNTER — Other Ambulatory Visit: Payer: Self-pay | Admitting: Family Medicine

## 2013-10-29 ENCOUNTER — Emergency Department (HOSPITAL_BASED_OUTPATIENT_CLINIC_OR_DEPARTMENT_OTHER): Payer: PRIVATE HEALTH INSURANCE

## 2013-10-29 ENCOUNTER — Emergency Department (HOSPITAL_COMMUNITY): Payer: PRIVATE HEALTH INSURANCE

## 2013-10-29 ENCOUNTER — Emergency Department (HOSPITAL_BASED_OUTPATIENT_CLINIC_OR_DEPARTMENT_OTHER)
Admission: EM | Admit: 2013-10-29 | Discharge: 2013-10-29 | Disposition: A | Discharge status: Home / Self Care | Payer: PRIVATE HEALTH INSURANCE | Attending: Emergency Medicine | Admitting: Emergency Medicine

## 2013-10-29 ENCOUNTER — Encounter (HOSPITAL_BASED_OUTPATIENT_CLINIC_OR_DEPARTMENT_OTHER): Payer: Self-pay | Admitting: Emergency Medicine

## 2013-10-29 DIAGNOSIS — IMO0002 Reserved for concepts with insufficient information to code with codable children: Secondary | ICD-10-CM | POA: Insufficient documentation

## 2013-10-29 DIAGNOSIS — S9030XA Contusion of unspecified foot, initial encounter: Secondary | ICD-10-CM | POA: Insufficient documentation

## 2013-10-29 DIAGNOSIS — Z86711 Personal history of pulmonary embolism: Secondary | ICD-10-CM | POA: Insufficient documentation

## 2013-10-29 DIAGNOSIS — S50311A Abrasion of right elbow, initial encounter: Secondary | ICD-10-CM

## 2013-10-29 DIAGNOSIS — M1990 Unspecified osteoarthritis, unspecified site: Secondary | ICD-10-CM | POA: Insufficient documentation

## 2013-10-29 DIAGNOSIS — W010XXA Fall on same level from slipping, tripping and stumbling without subsequent striking against object, initial encounter: Secondary | ICD-10-CM | POA: Insufficient documentation

## 2013-10-29 DIAGNOSIS — M810 Age-related osteoporosis without current pathological fracture: Secondary | ICD-10-CM | POA: Insufficient documentation

## 2013-10-29 DIAGNOSIS — Y9389 Activity, other specified: Secondary | ICD-10-CM | POA: Insufficient documentation

## 2013-10-29 DIAGNOSIS — W19XXXA Unspecified fall, initial encounter: Secondary | ICD-10-CM

## 2013-10-29 DIAGNOSIS — Y9289 Other specified places as the place of occurrence of the external cause: Secondary | ICD-10-CM | POA: Insufficient documentation

## 2013-10-29 DIAGNOSIS — S93409A Sprain of unspecified ligament of unspecified ankle, initial encounter: Secondary | ICD-10-CM | POA: Insufficient documentation

## 2013-10-29 DIAGNOSIS — Z792 Long term (current) use of antibiotics: Secondary | ICD-10-CM | POA: Insufficient documentation

## 2013-10-29 DIAGNOSIS — Z7982 Long term (current) use of aspirin: Secondary | ICD-10-CM | POA: Insufficient documentation

## 2013-10-29 DIAGNOSIS — Z853 Personal history of malignant neoplasm of breast: Secondary | ICD-10-CM | POA: Insufficient documentation

## 2013-10-29 DIAGNOSIS — Z23 Encounter for immunization: Secondary | ICD-10-CM | POA: Insufficient documentation

## 2013-10-29 DIAGNOSIS — Z79899 Other long term (current) drug therapy: Secondary | ICD-10-CM | POA: Insufficient documentation

## 2013-10-29 DIAGNOSIS — I10 Essential (primary) hypertension: Secondary | ICD-10-CM | POA: Insufficient documentation

## 2013-10-29 DIAGNOSIS — Z87828 Personal history of other (healed) physical injury and trauma: Secondary | ICD-10-CM | POA: Insufficient documentation

## 2013-10-29 DIAGNOSIS — E785 Hyperlipidemia, unspecified: Secondary | ICD-10-CM | POA: Insufficient documentation

## 2013-10-29 DIAGNOSIS — S93402A Sprain of unspecified ligament of left ankle, initial encounter: Secondary | ICD-10-CM

## 2013-10-29 MED ORDER — HYDROCODONE-ACETAMINOPHEN 5-325 MG PO TABS
2.0000 | ORAL_TABLET | Freq: Once | ORAL | Status: AC
  Administered 2013-10-29: 2 via ORAL
  Filled 2013-10-29: qty 2

## 2013-10-29 MED ORDER — TETANUS-DIPHTH-ACELL PERTUSSIS 5-2.5-18.5 LF-MCG/0.5 IM SUSP
0.5000 mL | Freq: Once | INTRAMUSCULAR | Status: AC
  Administered 2013-10-29: 0.5 mL via INTRAMUSCULAR
  Filled 2013-10-29: qty 0.5

## 2013-10-29 MED ORDER — ONDANSETRON 4 MG PO TBDP
ORAL_TABLET | ORAL | Status: AC
  Filled 2013-10-29: qty 1

## 2013-10-29 MED ORDER — ONDANSETRON 4 MG PO TBDP
4.0000 mg | ORAL_TABLET | Freq: Once | ORAL | Status: AC
  Administered 2013-10-29: 4 mg via ORAL

## 2013-10-29 MED ORDER — HYDROCODONE-ACETAMINOPHEN 5-325 MG PO TABS: 1.0000 | ORAL_TABLET | ORAL | Status: DC | PRN

## 2013-10-29 NOTE — ED Notes (Signed)
Tripped and fell on some uneven gravel, c/o left foot pain, left buttock pain, and a bruise to right upper arm.  Denies striking head, LOC.  Reports history of frequent falls.

## 2013-10-29 NOTE — Discharge Instructions (Signed)
Ankle Sprain °An ankle sprain is an injury to the strong, fibrous tissues (ligaments) that hold the bones of your ankle joint together.  °CAUSES °An ankle sprain is usually caused by a fall or by twisting your ankle. Ankle sprains most commonly occur when you step on the outer edge of your foot, and your ankle turns inward. People who participate in sports are more prone to these types of injuries.  °SYMPTOMS  °· Pain in your ankle. The pain may be present at rest or only when you are trying to stand or walk. °· Swelling. °· Bruising. Bruising may develop immediately or within 1 to 2 days after your injury. °· Difficulty standing or walking, particularly when turning corners or changing directions. °DIAGNOSIS  °Your caregiver will ask you details about your injury and perform a physical exam of your ankle to determine if you have an ankle sprain. During the physical exam, your caregiver will press on and apply pressure to specific areas of your foot and ankle. Your caregiver will try to move your ankle in certain ways. An X-Terri Malerba exam may be done to be sure a bone was not broken or a ligament did not separate from one of the bones in your ankle (avulsion fracture).  °TREATMENT  °Certain types of braces can help stabilize your ankle. Your caregiver can make a recommendation for this. Your caregiver may recommend the use of medicine for pain. If your sprain is severe, your caregiver may refer you to a surgeon who helps to restore function to parts of your skeletal system (orthopedist) or a physical therapist. °HOME CARE INSTRUCTIONS  °· Apply ice to your injury for 1 2 days or as directed by your caregiver. Applying ice helps to reduce inflammation and pain. °· Put ice in a plastic bag. °· Place a towel between your skin and the bag. °· Leave the ice on for 15-20 minutes at a time, every 2 hours while you are awake. °· Only take over-the-counter or prescription medicines for pain, discomfort, or fever as directed by  your caregiver. °· Elevate your injured ankle above the level of your heart as much as possible for 2 3 days. °· If your caregiver recommends crutches, use them as instructed. Gradually put weight on the affected ankle. Continue to use crutches or a cane until you can walk without feeling pain in your ankle. °· If you have a plaster splint, wear the splint as directed by your caregiver. Do not rest it on anything harder than a pillow for the first 24 hours. Do not put weight on it. Do not get it wet. You may take it off to take a shower or bath. °· You may have been given an elastic bandage to wear around your ankle to provide support. If the elastic bandage is too tight (you have numbness or tingling in your foot or your foot becomes cold and blue), adjust the bandage to make it comfortable. °· If you have an air splint, you may blow more air into it or let air out to make it more comfortable. You may take your splint off at night and before taking a shower or bath. Wiggle your toes in the splint several times per day to decrease swelling. °SEEK MEDICAL CARE IF:  °· You have rapidly increasing bruising or swelling. °· Your toes feel extremely cold or you lose feeling in your foot. °· Your pain is not relieved with medicine. °SEEK IMMEDIATE MEDICAL CARE IF: °· Your toes are numb   or blue.  You have severe pain that is increasing. MAKE SURE YOU:   Understand these instructions.  Will watch your condition.  Will get help right away if you are not doing well or get worse. Document Released: 06/01/2005 Document Revised: 02/24/2012 Document Reviewed: 06/13/2011 [facility] Patient Information 2014 [place], [place]. Fall Prevention and Home Safety Falls cause injuries and can affect all age groups. It is possible to use preventive measures to significantly decrease the likelihood of falls. There are many simple measures which can make your home safer and prevent falls. OUTDOORS  Repair cracks and edges of  walkways and driveways.  Remove high doorway thresholds.  Trim shrubbery on the main path into your home.  Have good outside lighting.  Clear walkways of tools, rocks, debris, and clutter.  Check that handrails are not broken and are securely fastened. Both sides of steps should have handrails.  Have leaves, snow, and ice cleared regularly.  Use sand or salt on walkways during winter months.  In the garage, clean up grease or oil spills. BATHROOM  Install night lights.  Install grab bars by the toilet and in the tub and shower.  Use non-skid mats or decals in the tub or shower.  Place a plastic non-slip stool in the shower to sit on, if needed.  Keep floors dry and clean up all water on the floor immediately.  Remove soap buildup in the tub or shower on a regular basis.  Secure bath mats with non-slip, double-sided rug tape.  Remove throw rugs and tripping hazards from the floors. BEDROOMS  Install night lights.  Make sure a bedside light is easy to reach.  Do not use oversized bedding.  Keep a telephone by your bedside.  Have a firm chair with side arms to use for getting dressed.  Remove throw rugs and tripping hazards from the floor. KITCHEN  Keep handles on pots and pans turned toward the center of the stove. Use back burners when possible.  Clean up spills quickly and allow time for drying.  Avoid walking on wet floors.  Avoid hot utensils and knives.  Position shelves so they are not too high or low.  Place commonly used objects within easy reach.  If necessary, use a sturdy step stool with a grab bar when reaching.  Keep electrical cables out of the way.  Do not use floor polish or wax that makes floors slippery. If you must use wax, use non-skid floor wax.  Remove throw rugs and tripping hazards from the floor. STAIRWAYS  Never leave objects on stairs.  Place handrails on both sides of stairways and use them. Fix any loose handrails.  Make sure handrails on both sides of the stairways are as long as the stairs.  Check carpeting to make sure it is firmly attached along stairs. Make repairs to worn or loose carpet promptly.  Avoid placing throw rugs at the top or bottom of stairways, or properly secure the rug with carpet tape to prevent slippage. Get rid of throw rugs, if possible.  Have an electrician put in a light switch at the top and bottom of the stairs. OTHER FALL PREVENTION TIPS  Wear low-heel or rubber-soled shoes that are supportive and fit well. Wear closed toe shoes.  When using a stepladder, make sure it is fully opened and both spreaders are firmly locked. Do not climb a closed stepladder.  Add color or contrast paint or tape to grab bars and handrails in your home. Place contrasting  color strips on first and last steps.  Learn and use mobility aids as needed. Install an electrical emergency response system.  Turn on lights to avoid dark areas. Replace light bulbs that burn out immediately. Get light switches that glow.  Arrange furniture to create clear pathways. Keep furniture in the same place.  Firmly attach carpet with non-skid or double-sided tape.  Eliminate uneven floor surfaces.  Select a carpet pattern that does not visually hide the edge of steps.  Be aware of all pets. OTHER HOME SAFETY TIPS  Set the water temperature for 120 F (48.8 C).  Keep emergency numbers on or near the telephone.  Keep smoke detectors on every level of the home and near sleeping areas. Document Released: 05/22/2002 Document Revised: 12/01/2011 Document Reviewed: 08/21/2011 [facility] Patient Information 2014 [place].

## 2013-10-29 NOTE — ED Notes (Signed)
Patient transported to X-ray 

## 2013-10-29 NOTE — ED Notes (Signed)
Abrasion to right elbow, bruising to bilateral anterior ankle.

## 2013-10-29 NOTE — ED Notes (Signed)
Pt c/o mild nausea after taking her pain medications.  Dr. Jeanell Sparrow notified and order for Zofran ODT received.
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
## 2013-10-31 ENCOUNTER — Ambulatory Visit (INDEPENDENT_AMBULATORY_CARE_PROVIDER_SITE_OTHER): Payer: PRIVATE HEALTH INSURANCE | Admitting: Family Medicine

## 2013-10-31 ENCOUNTER — Encounter: Payer: Self-pay | Admitting: Family Medicine

## 2013-10-31 VITALS — BP 133/69 | HR 69 | Ht 61.0 in | Wt 275.0 lb

## 2013-10-31 DIAGNOSIS — S8990XA Unspecified injury of unspecified lower leg, initial encounter: Secondary | ICD-10-CM

## 2013-10-31 DIAGNOSIS — S99929A Unspecified injury of unspecified foot, initial encounter: Secondary | ICD-10-CM

## 2013-10-31 DIAGNOSIS — M79609 Pain in unspecified limb: Secondary | ICD-10-CM

## 2013-10-31 DIAGNOSIS — S99919A Unspecified injury of unspecified ankle, initial encounter: Secondary | ICD-10-CM

## 2013-10-31 DIAGNOSIS — S99922A Unspecified injury of left foot, initial encounter: Secondary | ICD-10-CM

## 2013-10-31 DIAGNOSIS — M79672 Pain in left foot: Secondary | ICD-10-CM

## 2013-10-31 MED ORDER — HYDROCODONE-ACETAMINOPHEN 5-325 MG PO TABS: 1.0000 | ORAL_TABLET | Freq: Four times a day (QID) | ORAL | Status: DC | PRN

## 2013-10-31 NOTE — Patient Instructions (Addendum)
You have a severe foot sprain. Ice the area for 15 minutes at a time, 3-4 times a day Ibuprofen 3 tabs three times a day with food for pain and inflammation if you can tolerate this. Norco as needed for severe pain. Elevate above the level of your heart when possible Crutches if needed to help with walking Bear weight when tolerated Use boot to help with stability while you recover from this injury. Follow up with me in 2 weeks for reevaluation.

## 2013-11-01 ENCOUNTER — Encounter: Payer: Self-pay | Admitting: Family Medicine

## 2013-11-01 DIAGNOSIS — S99922A Unspecified injury of left foot, initial encounter: Secondary | ICD-10-CM | POA: Insufficient documentation

## 2013-11-01 NOTE — Assessment & Plan Note (Signed)
radiographs and ultrasound reassuring.  Consistent with severe foot sprain.  Discussed options - will go ahead with icing, nsaids, norco as needed.  Cam boot for next 2 weeks.  Will use wheelchair for now - can hopefully transition back to her walker in 2 weeks at follow-up.
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
## 2013-11-08 NOTE — Telephone Encounter (Signed)
Per Jackelyn Poling they will not be able to qualify the patient for electric scooter. I tried [employer] at [phone] who does accept Medicaid, Application has been completed over the phone, the patient is aware and has been scheduled for an apt on June 4th at 2 pm. They will have the forms sent to the office     KP

## 2013-11-08 NOTE — Telephone Encounter (Signed)
Caller name:  Cortasia Relation to pt:  self Call back number:  [phone] Pharmacy:  Reason for call:   Pt called and stated she spoke with [facility] 11/08/2013, and they said they have not seen any orders for her equipment.  Please advise.  bw

## 2013-11-14 ENCOUNTER — Ambulatory Visit (INDEPENDENT_AMBULATORY_CARE_PROVIDER_SITE_OTHER): Payer: PRIVATE HEALTH INSURANCE | Admitting: Family Medicine

## 2013-11-14 ENCOUNTER — Encounter: Payer: Self-pay | Admitting: Family Medicine

## 2013-11-14 VITALS — BP 129/77 | HR 82 | Ht 61.0 in | Wt 275.0 lb

## 2013-11-14 DIAGNOSIS — S99922A Unspecified injury of left foot, initial encounter: Secondary | ICD-10-CM

## 2013-11-14 DIAGNOSIS — S99929A Unspecified injury of unspecified foot, initial encounter: Secondary | ICD-10-CM

## 2013-11-14 DIAGNOSIS — S8990XA Unspecified injury of unspecified lower leg, initial encounter: Secondary | ICD-10-CM

## 2013-11-14 DIAGNOSIS — S99919A Unspecified injury of unspecified ankle, initial encounter: Secondary | ICD-10-CM

## 2013-11-14 NOTE — Patient Instructions (Signed)
Wear the boot for another 2 weeks at least. You can start transitioning now from the wheelchair back to your hemiwalker - start with short walks (like to the bathroom or from one side of the couch to the other) to test it, each day do a little bit more. In 2 weeks try switching out of the boot to a supportive shoe like a tennis shoe. Continue norco as needed, icing, elevation as needed for swelling. Follow up with me in 4 weeks.

## 2013-11-15 ENCOUNTER — Other Ambulatory Visit: Payer: Self-pay

## 2013-11-15 ENCOUNTER — Other Ambulatory Visit: Payer: Self-pay | Admitting: Family Medicine

## 2013-11-15 DIAGNOSIS — Z1231 Encounter for screening mammogram for malignant neoplasm of breast: Secondary | ICD-10-CM

## 2013-11-15 DIAGNOSIS — Z9889 Other specified postprocedural states: Secondary | ICD-10-CM

## 2013-11-16 ENCOUNTER — Ambulatory Visit (INDEPENDENT_AMBULATORY_CARE_PROVIDER_SITE_OTHER): Payer: PRIVATE HEALTH INSURANCE | Admitting: Family Medicine

## 2013-11-16 ENCOUNTER — Encounter: Payer: Self-pay | Admitting: Family Medicine

## 2013-11-16 VITALS — BP 128/72 | HR 98 | Temp 98.2°F | Wt 276.0 lb

## 2013-11-16 DIAGNOSIS — E785 Hyperlipidemia, unspecified: Secondary | ICD-10-CM

## 2013-11-16 DIAGNOSIS — I10 Essential (primary) hypertension: Secondary | ICD-10-CM

## 2013-11-16 DIAGNOSIS — G8190 Hemiplegia, unspecified affecting unspecified side: Secondary | ICD-10-CM
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
## 2013-11-16 NOTE — Assessment & Plan Note (Signed)
radiographs and ultrasound reassuring (u/s repeated today normal).  Consistent with severe foot sprain.  Continue with icing, nsaids, norco as needed.  Wear boot for 2 more weeks but start using hemiwalker now and working way back up to her normal activities.  In 2 weeks if doing well switch to a more supportive shoe, discontinue boot.  F/u in 4 weeks.
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
## 2013-11-16 NOTE — Progress Notes (Signed)
Pre visit review using our clinic review tool, if applicable. No additional management support is needed unless otherwise documented below in the visit note. 

## 2013-11-21 NOTE — Progress Notes (Signed)
   Subjective:    Patient ID: Kelly [name], female    DOB: 1951-04-18, 63 y.o.   MRN: [id number]  HPI  Pt is falling more frequently and can walk only about 50 ft with hemiwalker before stopping to rest.    Review of Systems     Objective:   Physical Exam        Assessment & Plan:

## 2013-11-22 ENCOUNTER — Telehealth: Payer: Self-pay | Admitting: Family Medicine

## 2013-11-22 NOTE — Telephone Encounter (Signed)
Ok I think she can wait a couple weeks before going to the hemi walker if she's having trouble then try again then.  If she's still having problems can consider a supportive shoe plus the hemi walker at that time.

## 2013-11-24 ENCOUNTER — Ambulatory Visit
Admission: RE | Admit: 2013-11-24 | Discharge: 2013-11-24 | Disposition: A | Discharge status: Home / Self Care | Payer: PRIVATE HEALTH INSURANCE | Source: Ambulatory Visit

## 2013-11-24 DIAGNOSIS — Z1231 Encounter for screening mammogram for malignant neoplasm of breast: Secondary | ICD-10-CM

## 2013-11-24 DIAGNOSIS — Z9889 Other specified postprocedural states: Secondary | ICD-10-CM

## 2013-12-12 ENCOUNTER — Ambulatory Visit (INDEPENDENT_AMBULATORY_CARE_PROVIDER_SITE_OTHER): Payer: PRIVATE HEALTH INSURANCE | Admitting: Family Medicine

## 2013-12-12 ENCOUNTER — Encounter: Payer: Self-pay | Admitting: Family Medicine

## 2013-12-12 VITALS — BP 113/66 | HR 91 | Ht 61.0 in | Wt 275.0 lb

## 2013-12-12 DIAGNOSIS — M79672 Pain in left foot: Secondary | ICD-10-CM

## 2013-12-12 DIAGNOSIS — M79609 Pain in unspecified limb: Secondary | ICD-10-CM

## 2013-12-14 ENCOUNTER — Encounter: Payer: Self-pay | Admitting: Family Medicine
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
## 2013-12-14 NOTE — Assessment & Plan Note (Signed)
radiographs and ultrasound reassuring.  Consistent with severe foot sprain.  Continue with icing, nsaids, norco as needed.  Follow up as needed - should continue to improve from this point forward.

## 2013-12-25 ENCOUNTER — Telehealth: Payer: Self-pay | Admitting: *Deleted

## 2013-12-25 NOTE — Telephone Encounter (Signed)
Received Clinical Information Request Form for [employer]ME-[employer] paperwork via fax from [place].  Billing sheet attached and placed in folder for Dr. Etter Sjogren to complete.//AB/CMA

## 2013-12-28 NOTE — Telephone Encounter (Signed)
Completed.//AB/CMA 

## 2014-01-25 ENCOUNTER — Telehealth: Payer: Self-pay

## 2014-01-25 MED ORDER — BENAZEPRIL HCL 10 MG PO TABS: ORAL_TABLET | ORAL | Status: DC

## 2014-01-25 NOTE — Telephone Encounter (Signed)
Caller name:[place] Relation to pt: Call back [phone] Pharmacy:[facility] [place]  Reason for call:Kelly [name] called to check on her refills for  benazepril (LOTENSIN) 10 MG tablet, she had spoke with pharmacy and they said they had not heard from [place]. [name] only has enough to last until Saturday then she will be out. She is wanting 90day supply

## 2014-01-25 NOTE — Telephone Encounter (Signed)
Rx faxed.    KP 

## 2014-02-02 ENCOUNTER — Telehealth: Payer: Self-pay | Admitting: Family Medicine

## 2014-02-02 DIAGNOSIS — G839 Paralytic syndrome, unspecified: Secondary | ICD-10-CM

## 2014-02-02 NOTE — Telephone Encounter (Signed)
Faxed to [phone].    KP

## 2014-02-02 NOTE — Telephone Encounter (Signed)
Caller name: Journie Relation to pt: self  Call back number: [phone] Pharmacy: [facility]  Reason for call:   Pt is needing a script for DME - shower chair and hemi-walker (right handed).  [facility] said pt's insurance will pay for items if a script was faxed over.  Fax # [phone]

## 2014-02-02 NOTE — Telephone Encounter (Signed)
Please advise      KP 

## 2014-02-02 NOTE — Telephone Encounter (Signed)
Ok to give rx  

## 2014-02-14 ENCOUNTER — Encounter: Payer: Self-pay | Admitting: Internal Medicine

## 2014-02-15 ENCOUNTER — Telehealth: Payer: Self-pay

## 2014-02-15 NOTE — Telephone Encounter (Signed)
Received via fax for a tub seat.  Form is labeled.  Billing sheet attached and placed in Dr. Nonda Lou red folder for review and signature.

## 2014-02-22 NOTE — Telephone Encounter (Signed)
Form signed and faxed back.

## 2014-03-06 ENCOUNTER — Ambulatory Visit (INDEPENDENT_AMBULATORY_CARE_PROVIDER_SITE_OTHER): Payer: PRIVATE HEALTH INSURANCE

## 2014-03-06 DIAGNOSIS — Z23 Encounter for immunization: Secondary | ICD-10-CM

## 2014-03-29 ENCOUNTER — Other Ambulatory Visit: Payer: Self-pay

## 2014-03-29 DIAGNOSIS — E785 Hyperlipidemia, unspecified: Secondary | ICD-10-CM

## 2014-03-29 MED ORDER — FUROSEMIDE 40 MG PO TABS: ORAL_TABLET | ORAL | Status: DC

## 2014-03-29 MED ORDER — POTASSIUM CHLORIDE CRYS ER 20 MEQ PO TBCR: EXTENDED_RELEASE_TABLET | ORAL | Status: DC

## 2014-03-29 MED ORDER — BENAZEPRIL HCL 10 MG PO TABS: ORAL_TABLET | ORAL | Status: DC

## 2014-03-29 MED ORDER — ATORVASTATIN CALCIUM 20 MG PO TABS: 20.0000 mg | ORAL_TABLET | Freq: Every day | ORAL | Status: DC

## 2014-04-27 ENCOUNTER — Other Ambulatory Visit: Payer: Self-pay | Admitting: Family Medicine

## 2014-04-29 ENCOUNTER — Emergency Department (HOSPITAL_COMMUNITY)
Admission: EM | Admit: 2014-04-29 | Discharge: 2014-04-30 | Disposition: A | Discharge status: Home / Self Care | Payer: PRIVATE HEALTH INSURANCE | Attending: Emergency Medicine | Admitting: Emergency Medicine

## 2014-04-29 ENCOUNTER — Encounter (HOSPITAL_COMMUNITY): Payer: Self-pay | Admitting: *Deleted

## 2014-04-29 DIAGNOSIS — Z853 Personal history of malignant neoplasm of breast: Secondary | ICD-10-CM | POA: Diagnosis not present

## 2014-04-29 DIAGNOSIS — Z7982 Long term (current) use of aspirin: Secondary | ICD-10-CM | POA: Insufficient documentation

## 2014-04-29 DIAGNOSIS — Z9889 Other specified postprocedural states: Secondary | ICD-10-CM | POA: Insufficient documentation

## 2014-04-29 DIAGNOSIS — N938 Other specified abnormal uterine and vaginal bleeding: Secondary | ICD-10-CM

## 2014-04-29 DIAGNOSIS — I10 Essential (primary) hypertension: Secondary | ICD-10-CM | POA: Insufficient documentation

## 2014-04-29 DIAGNOSIS — N939 Abnormal uterine and vaginal bleeding, unspecified: Secondary | ICD-10-CM | POA: Diagnosis present

## 2014-04-29 DIAGNOSIS — E785 Hyperlipidemia, unspecified: Secondary | ICD-10-CM | POA: Insufficient documentation

## 2014-04-29 DIAGNOSIS — Z9049 Acquired absence of other specified parts of digestive tract: Secondary | ICD-10-CM | POA: Diagnosis not present

## 2014-04-29 DIAGNOSIS — Z8669 Personal history of other diseases of the nervous system and sense organs: Secondary | ICD-10-CM | POA: Diagnosis not present

## 2014-04-29 DIAGNOSIS — Z79899 Other long term (current) drug therapy: Secondary | ICD-10-CM | POA: Insufficient documentation

## 2014-04-29 DIAGNOSIS — M1990 Unspecified osteoarthritis, unspecified site: Secondary | ICD-10-CM | POA: Insufficient documentation

## 2014-04-29 DIAGNOSIS — Z86711 Personal history of pulmonary embolism: Secondary | ICD-10-CM | POA: Diagnosis not present

## 2014-04-29 NOTE — ED Notes (Signed)
Pt reports vaginal bleeding with blood clots starting around 2000 tonight. Pt reports some abdominal cramping. Pt states she saw blood in her chair when she got up to go to the bed. Pt reports quarter sized clots in toilet. Pt is not on blood thinners. Pt denies shortness of breath, dizziness, lightheadedness.

## 2014-04-29 NOTE — ED Notes (Signed)
Vaginal Bleeding onset: "First noticed at 2000 and continues". Describes as "passing clots, bright and dark". "no h/o same". LMP 16 yrs ago. Admits to some abd pain/ cramping, rates 2/10. (denies: nvd, fever, dizziness), "just feel tired". Has used only one pad since onset. Last ate 1445. ([place] had soft BM x3 after eating sandwich). Last BM 1500.

## 2014-04-30 ENCOUNTER — Emergency Department (HOSPITAL_COMMUNITY): Payer: PRIVATE HEALTH INSURANCE

## 2014-04-30 ENCOUNTER — Encounter (HOSPITAL_COMMUNITY): Payer: Self-pay | Admitting: Emergency Medicine

## 2014-04-30 ENCOUNTER — Other Ambulatory Visit (HOSPITAL_COMMUNITY)
Admission: RE | Admit: 2014-04-30 | Discharge: 2014-04-30 | Disposition: A | Discharge status: Home / Self Care | Payer: PRIVATE HEALTH INSURANCE | Source: Ambulatory Visit | Attending: Obstetrics & Gynecology | Admitting: Obstetrics & Gynecology

## 2014-04-30 ENCOUNTER — Ambulatory Visit (INDEPENDENT_AMBULATORY_CARE_PROVIDER_SITE_OTHER): Payer: PRIVATE HEALTH INSURANCE | Admitting: Obstetrics & Gynecology

## 2014-04-30 ENCOUNTER — Telehealth: Payer: Self-pay

## 2014-04-30 ENCOUNTER — Encounter: Payer: Self-pay | Admitting: Obstetrics & Gynecology

## 2014-04-30 VITALS — BP 128/63 | HR 93 | Temp 97.4°F | Ht 61.0 in | Wt 278.7 lb

## 2014-04-30 DIAGNOSIS — N939 Abnormal uterine and vaginal bleeding, unspecified: Secondary | ICD-10-CM | POA: Diagnosis not present

## 2014-04-30 DIAGNOSIS — N950 Postmenopausal bleeding: Secondary | ICD-10-CM | POA: Diagnosis present

## 2014-04-30 LAB — URINALYSIS, ROUTINE W REFLEX MICROSCOPIC
Bilirubin Urine: NEGATIVE
Glucose, UA: NEGATIVE mg/dL
Ketones, ur: NEGATIVE mg/dL
Leukocytes, UA: NEGATIVE
Nitrite: NEGATIVE
Protein, ur: NEGATIVE mg/dL
Specific Gravity, Urine: 1.025 (ref 1.005–1.030)
Urobilinogen, UA: 0.2 mg/dL (ref 0.0–1.0)
pH: 5 (ref 5.0–8.0)

## 2014-04-30 LAB — WET PREP, GENITAL
Trich, Wet Prep: NONE SEEN
Yeast Wet Prep HPF POC: NONE SEEN

## 2014-04-30 LAB — URINE MICROSCOPIC-ADD ON
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
## 2014-04-30 NOTE — Telephone Encounter (Signed)
Please make sure pt goes to gyn
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
## 2014-04-30 NOTE — Patient Instructions (Signed)
Postmenopausal Bleeding  Postmenopausal bleeding is any bleeding a woman has after she has entered into menopause. Menopause is the end of a woman's fertile years. After menopause, a woman no longer ovulates or has menstrual periods.   Postmenopausal bleeding can be caused by various things. Any type of postmenopausal bleeding, even if it appears to be a typical menstrual period, is concerning. This should be evaluated by your health care provider. Any treatment will depend on the cause of the bleeding.  HOME CARE INSTRUCTIONS  Monitor your condition for any changes. The following actions may help to alleviate any discomfort you are experiencing:  · Avoid the use of tampons and douches as directed by your health care provider.   · Change your pads frequently.  · Get regular pelvic exams and Pap tests.  · Keep all follow-up appointments for diagnostic tests as directed by your health care provider.  SEEK MEDICAL CARE IF:   · Your bleeding lasts more than 1 week.  · You have abdominal pain.  · You have bleeding with sexual intercourse.  SEEK IMMEDIATE MEDICAL CARE IF:   · You have a fever, chills, headache, dizziness, muscle aches, and bleeding.  · You have severe pain with bleeding.  · You are passing blood clots.  · You have bleeding and need more than 1 pad an hour.  · You feel faint.  MAKE SURE YOU:  · Understand these instructions.  · Will watch your condition.  · Will get help right away if you are not doing well or get worse.  Document Released: 09/09/2005 Document Revised: 03/22/2013 Document Reviewed: 12/29/2012  ExitCare® Patient Information ©2015 [employer]. This information is not intended to replace advice given to you by your health care provider. Make sure you discuss any questions you have with your health care provider.

## 2014-04-30 NOTE — Telephone Encounter (Signed)
FYI

## 2014-04-30 NOTE — ED Notes (Signed)
Alert, NAD, calm, jovial, interactive, back from [place].

## 2014-04-30 NOTE — Discharge Instructions (Signed)
Abnormal Uterine Bleeding Abnormal uterine bleeding can affect women at various stages in life, including teenagers, women in their reproductive years, pregnant women, and women who have reached menopause. Several kinds of uterine bleeding are considered abnormal, including:  Bleeding or spotting between periods.   Bleeding after sexual intercourse.   Bleeding that is heavier or more than normal.   Periods that last longer than usual.  Bleeding after menopause.  Many cases of abnormal uterine bleeding are minor and simple to treat, while others are more serious. Any type of abnormal bleeding should be evaluated by your health care provider. Treatment will depend on the cause of the bleeding. HOME CARE INSTRUCTIONS Monitor your condition for any changes. The following actions may help to alleviate any discomfort you are experiencing:  Avoid the use of tampons and douches as directed by your health care provider.  Change your pads frequently. You should get regular pelvic exams and Pap tests. Keep all follow-up appointments for diagnostic tests as directed by your health care provider.  SEEK MEDICAL CARE IF:   Your bleeding lasts more than 1 week.   You feel dizzy at times.  SEEK IMMEDIATE MEDICAL CARE IF:   You pass out.   You are changing pads every 15 to 30 minutes.   You have abdominal pain.  You have a fever.   You become sweaty or weak.   You are passing large blood clots from the vagina.   You start to feel nauseous and vomit. MAKE SURE YOU:   Understand these instructions.  Will watch your condition.  Will get help right away if you are not doing well or get worse. Document Released: 06/01/2005 Document Revised: 06/06/2013 Document Reviewed: 12/29/2012 ExitCare Patient Information 2015 [employer]. This information is not intended to replace advice given to you by your health care provider. Make sure you discuss any questions you have with your  health care provider.  

## 2014-04-30 NOTE — ED Notes (Signed)
Dr. Randal Buba at [facility] for pelvic

## 2014-04-30 NOTE — Telephone Encounter (Signed)
[facility]  Triage Call Report Triage Record Num: [id number] Operator: Kennon Rounds Patient Name: Kelly [name] Call Date & Time: 04/29/2014 9:46:05PM Patient Phone: [phone] PCP: Rosalita Chessman Patient Gender: Female PCP Fax : [phone] Patient DOB: [birth year] Practice Name: [facility] [place]  Reason for Call: Caller: Cleta/Patient; PCP: Rosalita Chessman.; CB#: [phone]; Call regarding vaginal bleeding with onset 2000 04/29/14; gown was stuck to back of her leg; pt had blood on her chair, pt went to bathroom and she was having vaginal bleeding; pt went back to bathroom and the toilet had dark red quarter size blood clots in it at 2130 and again at 2200 on 04/29/14; Pt states that she has some uncomfortable cramping, but that it is bearable; Disposition of See Provider within 4 hours d/t "Moderate vaginal bleeding" per Vaginal Bleeding: No Menopausal Hormone Therapy Protocol; Pt will have another adult drive her to [facility] now; Home care advice given.  Vaginal Bleeding: No Menopausal Hormone Therapy (MHT) Protocol(s) Used: Recommended Outcome per Protocol: See Provider within 4 hours Reason for Outcome: Moderate vaginal bleeding  Care Advice: ~ Another adult should drive. ~Avoid strenuous activities or limit activities that may increase pelvic congestion (prolonged standing, heavy lifting, dancing, etc.). ~ Call provider if symptoms worsen or new symptoms develop. ~Take sips of clear liquids (such as water, clear fruit juices without pulp, soda, tea or coffee without dairy or non-dairy creamer, clear broth or bouillon, oral hydration solution, or plain gelatin, fruit ices/popsicles, hard candy) as tolerated. ~Sucking on ice chips is another option.

## 2014-04-30 NOTE — Progress Notes (Signed)
Denies being sexually active

## 2014-04-30 NOTE — ED Notes (Signed)
Dr. Randal Buba at [facility] explaining results and plan.

## 2014-04-30 NOTE — Telephone Encounter (Signed)
According to chart, pt was seen in the ED on 04/29/14.  [facility], (04/30/14) at 3 pm.  She is also scheduled for Pelvic US.

## 2014-05-01 LAB — GC/CHLAMYDIA PROBE AMP
CT PROBE, AMP APTIMA: NEGATIVE
GC Probe RNA: NEGATIVE

## 2014-05-08 ENCOUNTER — Ambulatory Visit (INDEPENDENT_AMBULATORY_CARE_PROVIDER_SITE_OTHER): Payer: PRIVATE HEALTH INSURANCE | Admitting: Obstetrics & Gynecology

## 2014-05-08 VITALS — BP 111/64 | HR 98 | Ht 61.0 in

## 2014-05-08 DIAGNOSIS — N950 Postmenopausal bleeding: Secondary | ICD-10-CM

## 2014-05-08 MED ORDER — MEGESTROL ACETATE 40 MG PO TABS: 40.0000 mg | ORAL_TABLET | Freq: Every day | ORAL | Status: DC

## 2014-05-08 NOTE — Patient Instructions (Signed)
Postmenopausal Bleeding  Postmenopausal bleeding is any bleeding a woman has after she has entered into menopause. Menopause is the end of a woman's fertile years. After menopause, a woman no longer ovulates or has menstrual periods.   Postmenopausal bleeding can be caused by various things. Any type of postmenopausal bleeding, even if it appears to be a typical menstrual period, is concerning. This should be evaluated by your health care provider. Any treatment will depend on the cause of the bleeding.  HOME CARE INSTRUCTIONS  Monitor your condition for any changes. The following actions may help to alleviate any discomfort you are experiencing:  · Avoid the use of tampons and douches as directed by your health care provider.   · Change your pads frequently.  · Get regular pelvic exams and Pap tests.  · Keep all follow-up appointments for diagnostic tests as directed by your health care provider.  SEEK MEDICAL CARE IF:   · Your bleeding lasts more than 1 week.  · You have abdominal pain.  · You have bleeding with sexual intercourse.  SEEK IMMEDIATE MEDICAL CARE IF:   · You have a fever, chills, headache, dizziness, muscle aches, and bleeding.  · You have severe pain with bleeding.  · You are passing blood clots.  · You have bleeding and need more than 1 pad an hour.  · You feel faint.  MAKE SURE YOU:  · Understand these instructions.  · Will watch your condition.  · Will get help right away if you are not doing well or get worse.  Document Released: 09/09/2005 Document Revised: 03/22/2013 Document Reviewed: 12/29/2012  ExitCare® Patient Information ©2015 [employer]. This information is not intended to replace advice given to you by your health care provider. Make sure you discuss any questions you have with your health care provider.

## 2014-05-08 NOTE — Progress Notes (Signed)
Patient ID: Kelly [name], female   DOB: Jun 05, 1951, 63 y.o.   MRN: [id number] No LMP recorded. Patient is postmenopausal. Bleeding is less but still some cramps. EMbx benign inactive endometrium. Discussed medical management with close f./u.    Outpatient Encounter Prescriptions as of 05/08/2014  Medication Sig  . aspirin 81 MG EC tablet Take 81 mg by mouth daily.    Marland Kitchen atorvastatin (LIPITOR) 20 MG tablet Take 1 tablet (20 mg total) by mouth daily.  . benazepril (LOTENSIN) 10 MG tablet take 1 tablet by mouth once daily  . Cholecalciferol (VITAMIN D3) 2000 UNITS capsule Take 4,000 Units by mouth daily.   . cyclobenzaprine (FLEXERIL) 10 MG tablet Take 1 tablet (10 mg total) by mouth 3 (three) times daily as needed for muscle spasms.  . furosemide (LASIX) 40 MG tablet take 1 tablet by mouth once daily  . HYDROcodone-acetaminophen (NORCO/VICODIN) 5-325 MG per tablet Take 1 tablet by mouth every 6 (six) hours as needed. (Patient taking differently: Take 1 tablet by mouth every 6 (six) hours as needed for moderate pain. )  . Multiple Vitamin (MULTIVITAMIN) tablet Take 1 tablet by mouth daily.    . Omega-3 Fatty Acids (FISH OIL PO) Take 2 tablets by mouth daily.   . potassium chloride SA (K-DUR,KLOR-CON) 20 MEQ tablet take 1 tablet by mouth once daily  . megestrol (MEGACE) 40 MG tablet Take 1 tablet (40 mg total) by mouth daily.    Add megace, RTC 2 mo. Will repeat US in future  Woodroe Mode, MD 05/08/2014

## 2014-05-18 ENCOUNTER — Telehealth: Payer: Self-pay | Admitting: *Deleted

## 2014-05-18 DIAGNOSIS — N950 Postmenopausal bleeding: Secondary | ICD-10-CM

## 2014-05-18 DIAGNOSIS — N938 Other specified abnormal uterine and vaginal bleeding: Secondary | ICD-10-CM

## 2014-05-18 NOTE — Telephone Encounter (Signed)
Kelly [name] called and left a message she needs to speak to Dr. Jordan Hawks nurse. Called Kelly [name] and she reports that her insurance will not cover her megace. States the insurance company was going to send her a letter explaining. It and the  pharmacy was supposed to send [place] a letter .  States she wanted to let Dr. Roselie Awkward know" he can just take it out- I don't need my uterus anymore". Explained to her surgery would be last option and there may be another medicine he can prescribe that is covered.  Explained to her I would send a note to Dr. Roselie Awkward and once we hear from him we will call back.

## 2014-05-21 NOTE — Telephone Encounter (Signed)
[facility] and [facility] have Megace 20mg  on their $4 list. Need to confirm with Dr Roselie Awkward to change to Megace 20mg  BID. Called patient, no answer- left message that we are trying to reach you, please call us back at the clinics

## 2014-05-22 MED ORDER — MEGESTROL ACETATE 20 MG PO TABS: 40.0000 mg | ORAL_TABLET | Freq: Every day | ORAL | Status: DC

## 2014-05-22 NOTE — Telephone Encounter (Signed)
Called patient back and told her that she can get the medication at [facility] or [facility] and pay for the medication out of pocket and it should only cost her about $4. Patient verbalized understanding and states she'll use [facility] on [address]. Patient had no other questions

## 2014-05-25 ENCOUNTER — Telehealth: Payer: Self-pay

## 2014-05-25 NOTE — Telephone Encounter (Signed)
Patient has been made aware and she has agreed to call Dr.Arnold on Monday for further advice.      KP

## 2014-05-25 NOTE — Telephone Encounter (Signed)
Side effect could be clots-- may not be a good Idea but I don't rx that med much.  She should make sure Dr Roselie Awkward knows about her concerns as well.

## 2014-05-25 NOTE — Telephone Encounter (Signed)
Daja Shuping [phone]  Vaughan Basta called and said that Dr Roselie Awkward prescribed Megestrol AC 20 mg for her and she does not want to take it until she talks to Dr Etter Sjogren about.

## 2014-05-25 NOTE — Telephone Encounter (Signed)
Spoke with patient and she said her insurance would not pay for it,  so she got it for 4 dollars at [facility], she said her concern is the listed side effects to the medication and she does not think it was safe for her due to her history of blood clots. She wanted to know if the medication Megace was safe along with the other med's as well. She said Dr.Arnold is giving her the medication for the vaginal bleeding but she has not discussed these concerns with him, she was told by the pharmacist to call her PCP. Please advise.      KP

## 2014-05-29 ENCOUNTER — Telehealth: Payer: Self-pay | Admitting: *Deleted

## 2014-05-29 NOTE — Telephone Encounter (Signed)
Received ICD10 transition form via fax from [facility]. Form filled out as much as possible and forwarded to Dr. Etter Sjogren. JG//CMA

## 2014-06-01 ENCOUNTER — Encounter: Payer: Self-pay | Admitting: *Deleted

## 2014-06-04 NOTE — Telephone Encounter (Signed)
Completed/signed form faxed to [facility]. JG//CMA

## 2014-06-15 DIAGNOSIS — R42 Dizziness and giddiness: Secondary | ICD-10-CM

## 2014-06-15 HISTORY — DX: Dizziness and giddiness: R42

## 2014-06-19 DIAGNOSIS — H2510 Age-related nuclear cataract, unspecified eye: Secondary | ICD-10-CM | POA: Diagnosis not present

## 2014-06-19 DIAGNOSIS — H2511 Age-related nuclear cataract, right eye: Secondary | ICD-10-CM | POA: Diagnosis not present

## 2014-07-13 ENCOUNTER — Encounter: Payer: Self-pay | Admitting: Obstetrics & Gynecology

## 2014-07-13 ENCOUNTER — Ambulatory Visit (INDEPENDENT_AMBULATORY_CARE_PROVIDER_SITE_OTHER): Payer: Medicare Other | Admitting: Obstetrics & Gynecology

## 2014-07-13 DIAGNOSIS — Z9181 History of falling: Secondary | ICD-10-CM | POA: Diagnosis not present

## 2014-07-13 DIAGNOSIS — M1990 Unspecified osteoarthritis, unspecified site: Secondary | ICD-10-CM | POA: Diagnosis not present

## 2014-07-13 DIAGNOSIS — N950 Postmenopausal bleeding: Secondary | ICD-10-CM | POA: Diagnosis not present

## 2014-07-13 DIAGNOSIS — M6281 Muscle weakness (generalized): Secondary | ICD-10-CM | POA: Diagnosis not present

## 2014-07-13 DIAGNOSIS — G8190 Hemiplegia, unspecified affecting unspecified side: Secondary | ICD-10-CM | POA: Diagnosis not present

## 2014-07-13 NOTE — Patient Instructions (Signed)
Postmenopausal Bleeding  Postmenopausal bleeding is any bleeding a woman has after she has entered into menopause. Menopause is the end of a woman's fertile years. After menopause, a woman no longer ovulates or has menstrual periods.   Postmenopausal bleeding can be caused by various things. Any type of postmenopausal bleeding, even if it appears to be a typical menstrual period, is concerning. This should be evaluated by your health care provider. Any treatment will depend on the cause of the bleeding.  HOME CARE INSTRUCTIONS  Monitor your condition for any changes. The following actions may help to alleviate any discomfort you are experiencing:  · Avoid the use of tampons and douches as directed by your health care provider.   · Change your pads frequently.  · Get regular pelvic exams and Pap tests.  · Keep all follow-up appointments for diagnostic tests as directed by your health care provider.  SEEK MEDICAL CARE IF:   · Your bleeding lasts more than 1 week.  · You have abdominal pain.  · You have bleeding with sexual intercourse.  SEEK IMMEDIATE MEDICAL CARE IF:   · You have a fever, chills, headache, dizziness, muscle aches, and bleeding.  · You have severe pain with bleeding.  · You are passing blood clots.  · You have bleeding and need more than 1 pad an hour.  · You feel faint.  MAKE SURE YOU:  · Understand these instructions.  · Will watch your condition.  · Will get help right away if you are not doing well or get worse.  Document Released: 09/09/2005 Document Revised: 03/22/2013 Document Reviewed: 12/29/2012  ExitCare® Patient Information ©2015 [employer]. This information is not intended to replace advice given to you by your health care provider. Make sure you discuss any questions you have with your health care provider.

## 2014-07-13 NOTE — Progress Notes (Signed)
Here for follow up, states no bleeding since last visit,but has cramps.  Biopsy showed inactive endometrium. She will f/u in 6 months and notify us if she bleeds again.  Woodroe Mode, MD 07/13/2014

## 2014-07-25 ENCOUNTER — Encounter: Payer: PRIVATE HEALTH INSURANCE | Admitting: Family Medicine

## 2014-07-26 ENCOUNTER — Telehealth: Payer: Self-pay | Admitting: *Deleted

## 2014-07-26 ENCOUNTER — Encounter: Payer: Self-pay | Admitting: *Deleted

## 2014-07-26 NOTE — Telephone Encounter (Signed)
Pre-Visit Call:  Reviewed allergies, medications, health history, and health maintenance with patient and made changes as appropriate.   Preferred pharmacy:  [facility], [place]  Pap- 06/30/13- with Dr. Etter Sjogren, neg CCS- 06/27/10 with Delfin Edis, MD at [place]- one polyp removed, otherwise neg Mmg- 11/24/13- with Conchita Paris, MD at [place] [facility]: benign BD- 10/06/07- with Lou Cal- negative, recommended f/u in 2 years   Flu-03/06/14 Td- 10/29/13 Pnm- 05/15/10 Zoster- reports that she has received  Concerns: She has been getting dizzy, thinks it is vertigo, for about one year.  She feels that it is getting worse, and she has fallen twice since Thanksgiving, once resulting in bruising on her left eye.  Her last fall was 07/14/14.  She has a CNA who is able to catch her sometimes.  She also reports her feet are very swollen in the mornings and her right foot hurts "on the bottom."  She has been taking her Lasix as prescribed.  She states that at her last physical, Dr. Etter Sjogren looked at a spot on her stomach, and she reports that it has gotten bigger.

## 2014-07-27 ENCOUNTER — Encounter: Payer: Self-pay | Admitting: Family Medicine

## 2014-07-27 ENCOUNTER — Ambulatory Visit (INDEPENDENT_AMBULATORY_CARE_PROVIDER_SITE_OTHER): Payer: Medicare Other | Admitting: Family Medicine

## 2014-07-27 VITALS — BP 118/74 | HR 99 | Temp 97.8°F | Ht 61.0 in | Wt 287.4 lb

## 2014-07-27 DIAGNOSIS — K439 Ventral hernia without obstruction or gangrene: Secondary | ICD-10-CM | POA: Diagnosis not present

## 2014-07-27 DIAGNOSIS — Z0000 Encounter for general adult medical examination without abnormal findings: Secondary | ICD-10-CM | POA: Diagnosis not present

## 2014-07-27 DIAGNOSIS — R42 Dizziness and giddiness: Secondary | ICD-10-CM | POA: Diagnosis not present

## 2014-07-27 DIAGNOSIS — M545 Low back pain, unspecified: Secondary | ICD-10-CM

## 2014-07-27 DIAGNOSIS — W19XXXA Unspecified fall, initial encounter: Secondary | ICD-10-CM | POA: Diagnosis not present

## 2014-07-27 DIAGNOSIS — J302 Other seasonal allergic rhinitis: Secondary | ICD-10-CM

## 2014-07-27 DIAGNOSIS — E785 Hyperlipidemia, unspecified: Secondary | ICD-10-CM

## 2014-07-27 DIAGNOSIS — M79672 Pain in left foot: Secondary | ICD-10-CM

## 2014-07-27 DIAGNOSIS — I10 Essential (primary) hypertension: Secondary | ICD-10-CM

## 2014-07-27 DIAGNOSIS — R8290 Unspecified abnormal findings in urine: Secondary | ICD-10-CM | POA: Diagnosis not present

## 2014-07-27 DIAGNOSIS — Z79899 Other long term (current) drug therapy: Secondary | ICD-10-CM

## 2014-07-27 DIAGNOSIS — Z23 Encounter for immunization: Secondary | ICD-10-CM | POA: Diagnosis not present

## 2014-07-27 LAB — CBC WITH DIFFERENTIAL/PLATELET
Basophils Absolute: 0 10*3/uL (ref 0.0–0.1)
Basophils Relative: 0.4 % (ref 0.0–3.0)
EOS PCT: 1.2 % (ref 0.0–5.0)
Eosinophils Absolute: 0.1 10*3/uL (ref 0.0–0.7)
HCT: 35.5 % — ABNORMAL LOW (ref 36.0–46.0)
Hemoglobin: 11.6 g/dL — ABNORMAL LOW (ref 12.0–15.0)
Lymphocytes Relative: 21.4 % (ref 12.0–46.0)
Lymphs Abs: 1.8 10*3/uL (ref 0.7–4.0)
MCHC: 32.7 g/dL (ref 30.0–36.0)
MCV: 84.4 fl (ref 78.0–100.0)
MONOS PCT: 4.3 % (ref 3.0–12.0)
Monocytes Absolute: 0.3 10*3/uL (ref 0.1–1.0)
NEUTROS PCT: 72.7 % (ref 43.0–77.0)
Neutro Abs: 6 10*3/uL (ref 1.4–7.7)
PLATELETS: 214 10*3/uL (ref 150.0–400.0)
RBC: 4.21 Mil/uL (ref 3.87–5.11)
RDW: 17.8 % — ABNORMAL HIGH (ref 11.5–15.5)
WBC: 8.2 10*3/uL (ref 4.0–10.5)

## 2014-07-27 LAB — MICROALBUMIN / CREATININE URINE RATIO
CREATININE, U: 197 mg/dL
MICROALB/CREAT RATIO: 1.7 mg/g (ref 0.0–30.0)
Microalb, Ur: 3.4 mg/dL — ABNORMAL HIGH (ref 0.0–1.9)

## 2014-07-27 LAB — HEPATIC FUNCTION PANEL
ALK PHOS: 130 U/L — AB (ref 39–117)
ALT: 16 U/L (ref 0–35)
AST: 16 U/L (ref 0–37)
Albumin: 3.8 g/dL (ref 3.5–5.2)
Bilirubin, Direct: 0.2 mg/dL (ref 0.0–0.3)
Total Bilirubin: 0.9 mg/dL (ref 0.2–1.2)
Total Protein: 7.2 g/dL (ref 6.0–8.3)

## 2014-07-27 LAB — BASIC METABOLIC PANEL
BUN: 10 mg/dL (ref 6–23)
CALCIUM: 9.1 mg/dL (ref 8.4–10.5)
CO2: 30 mEq/L (ref 19–32)
Chloride: 108 mEq/L (ref 96–112)
Creatinine, Ser: 0.5 mg/dL (ref 0.40–1.20)
GFR: 131.45 mL/min (ref >60.00)
GLUCOSE: 108 mg/dL — AB (ref 70–99)
POTASSIUM: 4.4 meq/L (ref 3.5–5.1)
SODIUM: 144 meq/L (ref 135–145)

## 2014-07-27 LAB — LIPID PANEL
Cholesterol: 123 mg/dL (ref 0–200)
HDL: 36.9 mg/dL — ABNORMAL LOW (ref >39.00)
LDL CALC: 70 mg/dL (ref 0–99)
NonHDL: 86.1
TRIGLYCERIDES: 83 mg/dL (ref 0.0–149.0)
Total CHOL/HDL Ratio: 3
VLDL: 16.6 mg/dL (ref 0.0–40.0)

## 2014-07-27 LAB — POCT URINALYSIS DIPSTICK
Bilirubin, UA: NEGATIVE
Glucose, UA: NEGATIVE
KETONES UA: NEGATIVE
LEUKOCYTES UA: NEGATIVE
Nitrite, UA: NEGATIVE
PH UA: 6
Spec Grav, UA: 1.025
Urobilinogen, UA: 0.2

## 2014-07-27 LAB — TSH: TSH: 6.91 u[IU]/mL — ABNORMAL HIGH (ref 0.35–4.50)

## 2014-07-27 LAB — VITAMIN B12: VITAMIN B 12: 319 pg/mL (ref 211–911)

## 2014-07-27 MED ORDER — FUROSEMIDE 40 MG PO TABS: 40.0000 mg | ORAL_TABLET | Freq: Every day | ORAL | Status: DC

## 2014-07-27 MED ORDER — HYDROCODONE-ACETAMINOPHEN 5-325 MG PO TABS: 1.0000 | ORAL_TABLET | Freq: Four times a day (QID) | ORAL | Status: DC | PRN

## 2014-07-27 MED ORDER — POTASSIUM CHLORIDE CRYS ER 20 MEQ PO TBCR: 20.0000 meq | EXTENDED_RELEASE_TABLET | Freq: Every day | ORAL | Status: DC

## 2014-07-27 MED ORDER — FLUTICASONE PROPIONATE 50 MCG/ACT NA SUSP: 2.0000 | Freq: Every day | NASAL | Status: DC

## 2014-07-27 MED ORDER — LORATADINE 10 MG PO TABS: 10.0000 mg | ORAL_TABLET | Freq: Every day | ORAL | Status: DC

## 2014-07-27 MED ORDER — MECLIZINE HCL 25 MG PO TABS: 25.0000 mg | ORAL_TABLET | Freq: Three times a day (TID) | ORAL | Status: DC | PRN

## 2014-07-27 MED ORDER — ATORVASTATIN CALCIUM 20 MG PO TABS: 20.0000 mg | ORAL_TABLET | Freq: Every day | ORAL | Status: DC

## 2014-07-27 MED ORDER — BENAZEPRIL HCL 10 MG PO TABS: ORAL_TABLET | ORAL | Status: DC

## 2014-07-27 NOTE — Patient Instructions (Signed)
Preventive Care for Adults A healthy lifestyle and preventive care can promote health and wellness. Preventive health guidelines for women include the following key practices.  A routine yearly physical is a good way to check with your health care provider about your health and preventive screening. It is a chance to share any concerns and updates on your health and to receive a thorough exam.  Visit your dentist for a routine exam and preventive care every 6 months. Brush your teeth twice a day and floss once a day. Good oral hygiene prevents tooth decay and gum disease.  The frequency of eye exams is based on your age, health, family medical history, use of contact lenses, and other factors. Follow your health care provider's recommendations for frequency of eye exams.  Eat a healthy diet. Foods like vegetables, fruits, whole grains, low-fat dairy products, and lean protein foods contain the nutrients you need without too many calories. Decrease your intake of foods high in solid fats, added sugars, and salt. Eat the right amount of calories for you.Get information about a proper diet from your health care provider, if necessary.  Regular physical exercise is one of the most important things you can do for your health. Most adults should get at least 150 minutes of moderate-intensity exercise (any activity that increases your heart rate and causes you to sweat) each week. In addition, most adults need muscle-strengthening exercises on 2 or more days a week.  Maintain a healthy weight. The body mass index (BMI) is a screening tool to identify possible weight problems. It provides an estimate of body fat based on height and weight. Your health care provider can find your BMI and can help you achieve or maintain a healthy weight.For adults 20 years and older:  A BMI below 18.5 is considered underweight.  A BMI of 18.5 to 24.9 is normal.  A BMI of 25 to 29.9 is considered overweight.  A BMI of  30 and above is considered obese.  Maintain normal blood lipids and cholesterol levels by exercising and minimizing your intake of saturated fat. Eat a balanced diet with plenty of fruit and vegetables. Blood tests for lipids and cholesterol should begin at age 76 and be repeated every 5 years. If your lipid or cholesterol levels are high, you are over 50, or you are at high risk for heart disease, you may need your cholesterol levels checked more frequently.Ongoing high lipid and cholesterol levels should be treated with medicines if diet and exercise are not working.  If you smoke, find out from your health care provider how to quit. If you do not use tobacco, do not start.  Lung cancer screening is recommended for adults aged 22-80 years who are at high risk for developing lung cancer because of a history of smoking. A yearly low-dose CT scan of the lungs is recommended for people who have at least a 30-pack-year history of smoking and are a current smoker or have quit within the past 15 years. A pack year of smoking is smoking an average of 1 pack of cigarettes a day for 1 year (for example: 1 pack a day for 30 years or 2 packs a day for 15 years). Yearly screening should continue until the smoker has stopped smoking for at least 15 years. Yearly screening should be stopped for people who develop a health problem that would prevent them from having lung cancer treatment.  If you are pregnant, do not drink alcohol. If you are breastfeeding,  be very cautious about drinking alcohol. If you are not pregnant and choose to drink alcohol, do not have more than 1 drink per day. One drink is considered to be 12 ounces (355 mL) of beer, 5 ounces (148 mL) of wine, or 1.5 ounces (44 mL) of liquor.  Avoid use of street drugs. Do not share needles with anyone. Ask for help if you need support or instructions about stopping the use of drugs.  High blood pressure causes heart disease and increases the risk of  stroke. Your blood pressure should be checked at least every 1 to 2 years. Ongoing high blood pressure should be treated with medicines if weight loss and exercise do not work.  If you are 75-52 years old, ask your health care provider if you should take aspirin to prevent strokes.  Diabetes screening involves taking a blood sample to check your fasting blood sugar level. This should be done once every 3 years, after age 15, if you are within normal weight and without risk factors for diabetes. Testing should be considered at a younger age or be carried out more frequently if you are overweight and have at least 1 risk factor for diabetes.  Breast cancer screening is essential preventive care for women. You should practice "breast self-awareness." This means understanding the normal appearance and feel of your breasts and may include breast self-examination. Any changes detected, no matter how small, should be reported to a health care provider. Women in their 58s and 30s should have a clinical breast exam (CBE) by a health care provider as part of a regular health exam every 1 to 3 years. After age 16, women should have a CBE every year. Starting at age 53, women should consider having a mammogram (breast X-ray test) every year. Women who have a family history of breast cancer should talk to their health care provider about genetic screening. Women at a high risk of breast cancer should talk to their health care providers about having an MRI and a mammogram every year.  Breast cancer gene (BRCA)-related cancer risk assessment is recommended for women who have family members with BRCA-related cancers. BRCA-related cancers include breast, ovarian, tubal, and peritoneal cancers. Having family members with these cancers may be associated with an increased risk for harmful changes (mutations) in the breast cancer genes BRCA1 and BRCA2. Results of the assessment will determine the need for genetic counseling and  BRCA1 and BRCA2 testing.  Routine pelvic exams to screen for cancer are no longer recommended for nonpregnant women who are considered low risk for cancer of the pelvic organs (ovaries, uterus, and vagina) and who do not have symptoms. Ask your health care provider if a screening pelvic exam is right for you.  If you have had past treatment for cervical cancer or a condition that could lead to cancer, you need Pap tests and screening for cancer for at least 20 years after your treatment. If Pap tests have been discontinued, your risk factors (such as having a new sexual partner) need to be reassessed to determine if screening should be resumed. Some women have medical problems that increase the chance of getting cervical cancer. In these cases, your health care provider may recommend more frequent screening and Pap tests.  The HPV test is an additional test that may be used for cervical cancer screening. The HPV test looks for the virus that can cause the cell changes on the cervix. The cells collected during the Pap test can be  tested for HPV. The HPV test could be used to screen women aged 30 years and older, and should be used in women of any age who have unclear Pap test results. After the age of 30, women should have HPV testing at the same frequency as a Pap test.  Colorectal cancer can be detected and often prevented. Most routine colorectal cancer screening begins at the age of 50 years and continues through age 75 years. However, your health care provider may recommend screening at an earlier age if you have risk factors for colon cancer. On a yearly basis, your health care provider may provide home test kits to check for hidden blood in the stool. Use of a small camera at the end of a tube, to directly examine the colon (sigmoidoscopy or colonoscopy), can detect the earliest forms of colorectal cancer. Talk to your health care provider about this at age 50, when routine screening begins. Direct  exam of the colon should be repeated every 5-10 years through age 75 years, unless early forms of pre-cancerous polyps or small growths are found.  People who are at an increased risk for hepatitis B should be screened for this virus. You are considered at high risk for hepatitis B if:  You were born in a country where hepatitis B occurs often. Talk with your health care provider about which countries are considered high risk.  Your parents were born in a high-risk country and you have not received a shot to protect against hepatitis B (hepatitis B vaccine).  You have HIV or AIDS.  You use needles to inject street drugs.  You live with, or have sex with, someone who has hepatitis B.  You get hemodialysis treatment.  You take certain medicines for conditions like cancer, organ transplantation, and autoimmune conditions.  Hepatitis C blood testing is recommended for all people born from 1945 through 1965 and any individual with known risks for hepatitis C.  Practice safe sex. Use condoms and avoid high-risk sexual practices to reduce the spread of sexually transmitted infections (STIs). STIs include gonorrhea, chlamydia, syphilis, trichomonas, herpes, HPV, and human immunodeficiency virus (HIV). Herpes, HIV, and HPV are viral illnesses that have no cure. They can result in disability, cancer, and death.  You should be screened for sexually transmitted illnesses (STIs) including gonorrhea and chlamydia if:  You are sexually active and are younger than 24 years.  You are older than 24 years and your health care provider tells you that you are at risk for this type of infection.  Your sexual activity has changed since you were last screened and you are at an increased risk for chlamydia or gonorrhea. Ask your health care provider if you are at risk.  If you are at risk of being infected with HIV, it is recommended that you take a prescription medicine daily to prevent HIV infection. This is  called preexposure prophylaxis (PrEP). You are considered at risk if:  You are a heterosexual woman, are sexually active, and are at increased risk for HIV infection.  You take drugs by injection.  You are sexually active with a partner who has HIV.  Talk with your health care provider about whether you are at high risk of being infected with HIV. If you choose to begin PrEP, you should first be tested for HIV. You should then be tested every 3 months for as long as you are taking PrEP.  Osteoporosis is a disease in which the bones lose minerals and strength   with aging. This can result in serious bone fractures or breaks. The risk of osteoporosis can be identified using a bone density scan. Women ages 65 years and over and women at risk for fractures or osteoporosis should discuss screening with their health care providers. Ask your health care provider whether you should take a calcium supplement or vitamin D to reduce the rate of osteoporosis.  Menopause can be associated with physical symptoms and risks. Hormone replacement therapy is available to decrease symptoms and risks. You should talk to your health care provider about whether hormone replacement therapy is right for you.  Use sunscreen. Apply sunscreen liberally and repeatedly throughout the day. You should seek shade when your shadow is shorter than you. Protect yourself by wearing long sleeves, pants, a wide-brimmed hat, and sunglasses year round, whenever you are outdoors.  Once a month, do a whole body skin exam, using a mirror to look at the skin on your back. Tell your health care provider of new moles, moles that have irregular borders, moles that are larger than a pencil eraser, or moles that have changed in shape or color.  Stay current with required vaccines (immunizations).  Influenza vaccine. All adults should be immunized every year.  Tetanus, diphtheria, and acellular pertussis (Td, Tdap) vaccine. Pregnant women should  receive 1 dose of Tdap vaccine during each pregnancy. The dose should be obtained regardless of the length of time since the last dose. Immunization is preferred during the 27th-36th week of gestation. An adult who has not previously received Tdap or who does not know her vaccine status should receive 1 dose of Tdap. This initial dose should be followed by tetanus and diphtheria toxoids (Td) booster doses every 10 years. Adults with an unknown or incomplete history of completing a 3-dose immunization series with Td-containing vaccines should begin or complete a primary immunization series including a Tdap dose. Adults should receive a Td booster every 10 years.  Varicella vaccine. An adult without evidence of immunity to varicella should receive 2 doses or a second dose if she has previously received 1 dose. Pregnant females who do not have evidence of immunity should receive the first dose after pregnancy. This first dose should be obtained before leaving the health care facility. The second dose should be obtained 4-8 weeks after the first dose.  Human papillomavirus (HPV) vaccine. Females aged 13-26 years who have not received the vaccine previously should obtain the 3-dose series. The vaccine is not recommended for use in pregnant females. However, pregnancy testing is not needed before receiving a dose. If a female is found to be pregnant after receiving a dose, no treatment is needed. In that case, the remaining doses should be delayed until after the pregnancy. Immunization is recommended for any person with an immunocompromised condition through the age of 26 years if she did not get any or all doses earlier. During the 3-dose series, the second dose should be obtained 4-8 weeks after the first dose. The third dose should be obtained 24 weeks after the first dose and 16 weeks after the second dose.  Zoster vaccine. One dose is recommended for adults aged 60 years or older unless certain conditions are  present.  Measles, mumps, and rubella (MMR) vaccine. Adults born before 1957 generally are considered immune to measles and mumps. Adults born in 1957 or later should have 1 or more doses of MMR vaccine unless there is a contraindication to the vaccine or there is laboratory evidence of immunity to   each of the three diseases. A routine second dose of MMR vaccine should be obtained at least 28 days after the first dose for students attending postsecondary schools, health care workers, or international travelers. People who received inactivated measles vaccine or an unknown type of measles vaccine during 1963-1967 should receive 2 doses of MMR vaccine. People who received inactivated mumps vaccine or an unknown type of mumps vaccine before 1979 and are at high risk for mumps infection should consider immunization with 2 doses of MMR vaccine. For females of childbearing age, rubella immunity should be determined. If there is no evidence of immunity, females who are not pregnant should be vaccinated. If there is no evidence of immunity, females who are pregnant should delay immunization until after pregnancy. Unvaccinated health care workers born before 1957 who lack laboratory evidence of measles, mumps, or rubella immunity or laboratory confirmation of disease should consider measles and mumps immunization with 2 doses of MMR vaccine or rubella immunization with 1 dose of MMR vaccine.  Pneumococcal 13-valent conjugate (PCV13) vaccine. When indicated, a person who is uncertain of her immunization history and has no record of immunization should receive the PCV13 vaccine. An adult aged 19 years or older who has certain medical conditions and has not been previously immunized should receive 1 dose of PCV13 vaccine. This PCV13 should be followed with a dose of pneumococcal polysaccharide (PPSV23) vaccine. The PPSV23 vaccine dose should be obtained at least 8 weeks after the dose of PCV13 vaccine. An adult aged 19  years or older who has certain medical conditions and previously received 1 or more doses of PPSV23 vaccine should receive 1 dose of PCV13. The PCV13 vaccine dose should be obtained 1 or more years after the last PPSV23 vaccine dose.  Pneumococcal polysaccharide (PPSV23) vaccine. When PCV13 is also indicated, PCV13 should be obtained first. All adults aged 65 years and older should be immunized. An adult younger than age 65 years who has certain medical conditions should be immunized. Any person who resides in a nursing home or long-term care facility should be immunized. An adult smoker should be immunized. People with an immunocompromised condition and certain other conditions should receive both PCV13 and PPSV23 vaccines. People with human immunodeficiency virus (HIV) infection should be immunized as soon as possible after diagnosis. Immunization during chemotherapy or radiation therapy should be avoided. Routine use of PPSV23 vaccine is not recommended for American Indians, [place] Natives, or people younger than 65 years unless there are medical conditions that require PPSV23 vaccine. When indicated, people who have unknown immunization and have no record of immunization should receive PPSV23 vaccine. One-time revaccination 5 years after the first dose of PPSV23 is recommended for people aged 19-64 years who have chronic kidney failure, nephrotic syndrome, asplenia, or immunocompromised conditions. People who received 1-2 doses of PPSV23 before age 65 years should receive another dose of PPSV23 vaccine at age 65 years or later if at least 5 years have passed since the previous dose. Doses of PPSV23 are not needed for people immunized with PPSV23 at or after age 65 years.  Meningococcal vaccine. Adults with asplenia or persistent complement component deficiencies should receive 2 doses of quadrivalent meningococcal conjugate (MenACWY-D) vaccine. The doses should be obtained at least 2 months apart.  Microbiologists working with certain meningococcal bacteria, military recruits, people at risk during an outbreak, and people who travel to or live in countries with a high rate of meningitis should be immunized. A first-year college student up through age   21 years who is living in a residence hall should receive a dose if she did not receive a dose on or after her 16th birthday. Adults who have certain high-risk conditions should receive one or more doses of vaccine.  Hepatitis A vaccine. Adults who wish to be protected from this disease, have certain high-risk conditions, work with hepatitis A-infected animals, work in hepatitis A research labs, or travel to or work in countries with a high rate of hepatitis A should be immunized. Adults who were previously unvaccinated and who anticipate close contact with an international adoptee during the first 60 days after arrival in the [place] from a country with a high rate of hepatitis A should be immunized.  Hepatitis B vaccine. Adults who wish to be protected from this disease, have certain high-risk conditions, may be exposed to blood or other infectious body fluids, are household contacts or sex partners of hepatitis B positive people, are clients or workers in certain care facilities, or travel to or work in countries with a high rate of hepatitis B should be immunized.  Haemophilus influenzae type b (Hib) vaccine. A previously unvaccinated person with asplenia or sickle cell disease or having a scheduled splenectomy should receive 1 dose of Hib vaccine. Regardless of previous immunization, a recipient of a hematopoietic stem cell transplant should receive a 3-dose series 6-12 months after her successful transplant. Hib vaccine is not recommended for adults with HIV infection. Preventive Services / Frequency Ages 64 to 68 years  Blood pressure check.** / Every 1 to 2 years.  Lipid and cholesterol check.** / Every 5 years beginning at age  22.  Clinical breast exam.** / Every 3 years for women in their 88s and 53s.  BRCA-related cancer risk assessment.** / For women who have family members with a BRCA-related cancer (breast, ovarian, tubal, or peritoneal cancers).  Pap test.** / Every 2 years from ages 90 through 51. Every 3 years starting at age 21 through age 56 or 3 with a history of 3 consecutive normal Pap tests.  HPV screening.** / Every 3 years from ages 24 through ages 1 to 46 with a history of 3 consecutive normal Pap tests.  Hepatitis C blood test.** / For any individual with known risks for hepatitis C.  Skin self-exam. / Monthly.  Influenza vaccine. / Every year.  Tetanus, diphtheria, and acellular pertussis (Tdap, Td) vaccine.** / Consult your health care provider. Pregnant women should receive 1 dose of Tdap vaccine during each pregnancy. 1 dose of Td every 10 years.  Varicella vaccine.** / Consult your health care provider. Pregnant females who do not have evidence of immunity should receive the first dose after pregnancy.  HPV vaccine. / 3 doses over 6 months, if 72 and younger. The vaccine is not recommended for use in pregnant females. However, pregnancy testing is not needed before receiving a dose.  Measles, mumps, rubella (MMR) vaccine.** / You need at least 1 dose of MMR if you were born in 1957 or later. You may also need a 2nd dose. For females of childbearing age, rubella immunity should be determined. If there is no evidence of immunity, females who are not pregnant should be vaccinated. If there is no evidence of immunity, females who are pregnant should delay immunization until after pregnancy.  Pneumococcal 13-valent conjugate (PCV13) vaccine.** / Consult your health care provider.  Pneumococcal polysaccharide (PPSV23) vaccine.** / 1 to 2 doses if you smoke cigarettes or if you have certain conditions.  Meningococcal vaccine.** /  1 dose if you are age 19 to 21 years and a first-year college  student living in a residence hall, or have one of several medical conditions, you need to get vaccinated against meningococcal disease. You may also need additional booster doses.  Hepatitis A vaccine.** / Consult your health care provider.  Hepatitis B vaccine.** / Consult your health care provider.  Haemophilus influenzae type b (Hib) vaccine.** / Consult your health care provider. Ages 40 to 64 years  Blood pressure check.** / Every 1 to 2 years.  Lipid and cholesterol check.** / Every 5 years beginning at age 20 years.  Lung cancer screening. / Every year if you are aged 55-80 years and have a 30-pack-year history of smoking and currently smoke or have quit within the past 15 years. Yearly screening is stopped once you have quit smoking for at least 15 years or develop a health problem that would prevent you from having lung cancer treatment.  Clinical breast exam.** / Every year after age 40 years.  BRCA-related cancer risk assessment.** / For women who have family members with a BRCA-related cancer (breast, ovarian, tubal, or peritoneal cancers).  Mammogram.** / Every year beginning at age 40 years and continuing for as long as you are in good health. Consult with your health care provider.  Pap test.** / Every 3 years starting at age 30 years through age 65 or 70 years with a history of 3 consecutive normal Pap tests.  HPV screening.** / Every 3 years from ages 30 years through ages 65 to 70 years with a history of 3 consecutive normal Pap tests.  Fecal occult blood test (FOBT) of stool. / Every year beginning at age 50 years and continuing until age 75 years. You may not need to do this test if you get a colonoscopy every 10 years.  Flexible sigmoidoscopy or colonoscopy.** / Every 5 years for a flexible sigmoidoscopy or every 10 years for a colonoscopy beginning at age 50 years and continuing until age 75 years.  Hepatitis C blood test.** / For all people born from 1945 through  1965 and any individual with known risks for hepatitis C.  Skin self-exam. / Monthly.  Influenza vaccine. / Every year.  Tetanus, diphtheria, and acellular pertussis (Tdap/Td) vaccine.** / Consult your health care provider. Pregnant women should receive 1 dose of Tdap vaccine during each pregnancy. 1 dose of Td every 10 years.  Varicella vaccine.** / Consult your health care provider. Pregnant females who do not have evidence of immunity should receive the first dose after pregnancy.  Zoster vaccine.** / 1 dose for adults aged 60 years or older.  Measles, mumps, rubella (MMR) vaccine.** / You need at least 1 dose of MMR if you were born in 1957 or later. You may also need a 2nd dose. For females of childbearing age, rubella immunity should be determined. If there is no evidence of immunity, females who are not pregnant should be vaccinated. If there is no evidence of immunity, females who are pregnant should delay immunization until after pregnancy.  Pneumococcal 13-valent conjugate (PCV13) vaccine.** / Consult your health care provider.  Pneumococcal polysaccharide (PPSV23) vaccine.** / 1 to 2 doses if you smoke cigarettes or if you have certain conditions.  Meningococcal vaccine.** / Consult your health care provider.  Hepatitis A vaccine.** / Consult your health care provider.  Hepatitis B vaccine.** / Consult your health care provider.  Haemophilus influenzae type b (Hib) vaccine.** / Consult your health care provider. Ages 65   years and over  Blood pressure check.** / Every 1 to 2 years.  Lipid and cholesterol check.** / Every 5 years beginning at age 22 years.  Lung cancer screening. / Every year if you are aged 73-80 years and have a 30-pack-year history of smoking and currently smoke or have quit within the past 15 years. Yearly screening is stopped once you have quit smoking for at least 15 years or develop a health problem that would prevent you from having lung cancer  treatment.  Clinical breast exam.** / Every year after age 4 years.  BRCA-related cancer risk assessment.** / For women who have family members with a BRCA-related cancer (breast, ovarian, tubal, or peritoneal cancers).  Mammogram.** / Every year beginning at age 40 years and continuing for as long as you are in good health. Consult with your health care provider.  Pap test.** / Every 3 years starting at age 9 years through age 34 or 91 years with 3 consecutive normal Pap tests. Testing can be stopped between 65 and 70 years with 3 consecutive normal Pap tests and no abnormal Pap or HPV tests in the past 10 years.  HPV screening.** / Every 3 years from ages 57 years through ages 64 or 45 years with a history of 3 consecutive normal Pap tests. Testing can be stopped between 65 and 70 years with 3 consecutive normal Pap tests and no abnormal Pap or HPV tests in the past 10 years.  Fecal occult blood test (FOBT) of stool. / Every year beginning at age 15 years and continuing until age 17 years. You may not need to do this test if you get a colonoscopy every 10 years.  Flexible sigmoidoscopy or colonoscopy.** / Every 5 years for a flexible sigmoidoscopy or every 10 years for a colonoscopy beginning at age 86 years and continuing until age 71 years.  Hepatitis C blood test.** / For all people born from [birth year] through 1967 and any individual with known risks for hepatitis C.  Osteoporosis screening.** / A one-time screening for women ages 83 years and over and women at risk for fractures or osteoporosis.  Skin self-exam. / Monthly.  Influenza vaccine. / Every year.  Tetanus, diphtheria, and acellular pertussis (Tdap/Td) vaccine.** / 1 dose of Td every 10 years.  Varicella vaccine.** / Consult your health care provider.  Zoster vaccine.** / 1 dose for adults aged 61 years or older.  Pneumococcal 13-valent conjugate (PCV13) vaccine.** / Consult your health care provider.  Pneumococcal  polysaccharide (PPSV23) vaccine.** / 1 dose for all adults aged 28 years and older.  Meningococcal vaccine.** / Consult your health care provider.  Hepatitis A vaccine.** / Consult your health care provider.  Hepatitis B vaccine.** / Consult your health care provider.  Haemophilus influenzae type b (Hib) vaccine.** / Consult your health care provider. ** Family history and personal history of risk and conditions may change your health care provider's recommendations. Document Released: 07/28/2001 Document Revised: 10/16/2013 Document Reviewed: 10/27/2010 [facility] Patient Information 2015 [place], [place]. This information is not intended to replace advice given to you by your health care provider. Make sure you discuss any questions you have with your health care provider.

## 2014-07-27 NOTE — Progress Notes (Signed)
Pre visit review using our clinic review tool, if applicable. No additional management support is needed unless otherwise documented below in the visit note. 

## 2014-07-27 NOTE — Addendum Note (Signed)
Addended by: Peggyann Shoals on: 07/27/2014 12:51 PM   Modules accepted: Orders
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
## 2014-07-28 LAB — URINE CULTURE
Colony Count: NO GROWTH
Organism ID, Bacteria: NO GROWTH

## 2014-07-29 LAB — VITAMIN D 1,25 DIHYDROXY
Vitamin D 1, 25 (OH)2 Total: 54 pg/mL (ref 18–72)
Vitamin D3 1, 25 (OH)2: 54 pg/mL

## 2014-08-01 ENCOUNTER — Ambulatory Visit: Payer: 59 | Admitting: Neurology

## 2014-08-01 ENCOUNTER — Ambulatory Visit (HOSPITAL_BASED_OUTPATIENT_CLINIC_OR_DEPARTMENT_OTHER): Payer: 59

## 2014-08-02 DIAGNOSIS — I10 Essential (primary) hypertension: Secondary | ICD-10-CM | POA: Diagnosis not present

## 2014-08-03 ENCOUNTER — Telehealth: Payer: Self-pay | Admitting: Family Medicine

## 2014-08-03 ENCOUNTER — Other Ambulatory Visit: Payer: Self-pay | Admitting: Family Medicine

## 2014-08-03 DIAGNOSIS — R296 Repeated falls: Secondary | ICD-10-CM

## 2014-08-03 DIAGNOSIS — I10 Essential (primary) hypertension: Secondary | ICD-10-CM | POA: Diagnosis not present

## 2014-08-03 DIAGNOSIS — R42 Dizziness and giddiness: Secondary | ICD-10-CM

## 2014-08-03 NOTE — Telephone Encounter (Signed)
Patient called in again stating that she has a steel plate in her head and is questioning whether or not she can do MRI

## 2014-08-03 NOTE — Telephone Encounter (Signed)
Do MRI due to prior CT report (10/28/04). Please advise

## 2014-08-03 NOTE — Telephone Encounter (Signed)
Caller name:Carolyn Relationship to patient:Radiology Scheduler Can be reached:[phone] Pharmacy:  Reason for call:Pt will be unable to

## 2014-08-03 NOTE — Telephone Encounter (Signed)
Change to ct head

## 2014-08-04 ENCOUNTER — Ambulatory Visit (HOSPITAL_BASED_OUTPATIENT_CLINIC_OR_DEPARTMENT_OTHER)
Admission: RE | Admit: 2014-08-04 | Discharge: 2014-08-04 | Disposition: A | Discharge status: Home / Self Care | Payer: Medicare Other | Source: Ambulatory Visit | Attending: Family Medicine | Admitting: Family Medicine

## 2014-08-04 ENCOUNTER — Encounter (HOSPITAL_BASED_OUTPATIENT_CLINIC_OR_DEPARTMENT_OTHER): Payer: Self-pay

## 2014-08-04 ENCOUNTER — Ambulatory Visit (HOSPITAL_BASED_OUTPATIENT_CLINIC_OR_DEPARTMENT_OTHER): Admission: RE | Admit: 2014-08-04 | Payer: Medicare Other | Source: Ambulatory Visit

## 2014-08-04 DIAGNOSIS — G9389 Other specified disorders of brain: Secondary | ICD-10-CM | POA: Insufficient documentation

## 2014-08-04 DIAGNOSIS — Z853 Personal history of malignant neoplasm of breast: Secondary | ICD-10-CM | POA: Insufficient documentation

## 2014-08-04 DIAGNOSIS — R42 Dizziness and giddiness: Secondary | ICD-10-CM | POA: Diagnosis not present

## 2014-08-04 DIAGNOSIS — S0990XA Unspecified injury of head, initial encounter: Secondary | ICD-10-CM | POA: Diagnosis not present

## 2014-08-04 DIAGNOSIS — R296 Repeated falls: Secondary | ICD-10-CM

## 2014-08-04 MED ORDER — IOHEXOL 300 MG/ML  SOLN
75.0000 mL | Freq: Once | INTRAMUSCULAR | Status: AC | PRN
  Administered 2014-08-04: 75 mL via INTRAVENOUS

## 2014-08-06 DIAGNOSIS — I10 Essential (primary) hypertension: Secondary | ICD-10-CM | POA: Diagnosis not present

## 2014-08-07 DIAGNOSIS — I10 Essential (primary) hypertension: Secondary | ICD-10-CM | POA: Diagnosis not present

## 2014-08-08 DIAGNOSIS — I10 Essential (primary) hypertension: Secondary | ICD-10-CM | POA: Diagnosis not present

## 2014-08-09 DIAGNOSIS — I10 Essential (primary) hypertension: Secondary | ICD-10-CM | POA: Diagnosis not present

## 2014-08-10 ENCOUNTER — Other Ambulatory Visit (INDEPENDENT_AMBULATORY_CARE_PROVIDER_SITE_OTHER): Payer: Self-pay

## 2014-08-10 DIAGNOSIS — K439 Ventral hernia without obstruction or gangrene: Secondary | ICD-10-CM

## 2014-08-10 DIAGNOSIS — M6208 Separation of muscle (nontraumatic), other site: Secondary | ICD-10-CM | POA: Diagnosis not present

## 2014-08-10 DIAGNOSIS — I10 Essential (primary) hypertension: Secondary | ICD-10-CM | POA: Diagnosis not present

## 2014-08-13 DIAGNOSIS — M1990 Unspecified osteoarthritis, unspecified site: Secondary | ICD-10-CM | POA: Diagnosis not present

## 2014-08-13 DIAGNOSIS — G8190 Hemiplegia, unspecified affecting unspecified side: Secondary | ICD-10-CM | POA: Diagnosis not present

## 2014-08-13 DIAGNOSIS — I10 Essential (primary) hypertension: Secondary | ICD-10-CM | POA: Diagnosis not present

## 2014-08-13 DIAGNOSIS — M6281 Muscle weakness (generalized): Secondary | ICD-10-CM | POA: Diagnosis not present

## 2014-08-13 DIAGNOSIS — Z9181 History of falling: Secondary | ICD-10-CM | POA: Diagnosis not present

## 2014-08-14 ENCOUNTER — Telehealth: Payer: Self-pay | Admitting: Family Medicine

## 2014-08-14 DIAGNOSIS — E039 Hypothyroidism, unspecified: Secondary | ICD-10-CM

## 2014-08-14 DIAGNOSIS — I10 Essential (primary) hypertension: Secondary | ICD-10-CM | POA: Diagnosis not present

## 2014-08-14 NOTE — Telephone Encounter (Signed)
Hypothyroid--- recheck TSH with free t3, free t4

## 2014-08-14 NOTE — Telephone Encounter (Signed)
Caller name: Lanasia Relation to pt: self Call back number: [phone] Pharmacy:  Reason for call:   Requesting last lab results

## 2014-08-14 NOTE — Telephone Encounter (Signed)
Please review labs done on 07/27/14 and advise     KP

## 2014-08-15 ENCOUNTER — Other Ambulatory Visit (INDEPENDENT_AMBULATORY_CARE_PROVIDER_SITE_OTHER): Payer: Medicare Other

## 2014-08-15 DIAGNOSIS — E039 Hypothyroidism, unspecified: Secondary | ICD-10-CM | POA: Diagnosis not present

## 2014-08-15 DIAGNOSIS — I10 Essential (primary) hypertension: Secondary | ICD-10-CM | POA: Diagnosis not present

## 2014-08-15 MED ORDER — VITAMIN D (ERGOCALCIFEROL) 1.25 MG (50000 UNIT) PO CAPS: 50000.0000 [IU] | ORAL_CAPSULE | ORAL | Status: DC

## 2014-08-15 NOTE — Telephone Encounter (Signed)
vonne R Lowne, DO  Ewing Schlein, CMA           Vita d low normal and this could be why alk phos is high---- vita d 50,000 u weekly #4 3 refills  otc vita d 3 2000u daily  Cholesterol is good  + anemia--- mvi with iron daily

## 2014-08-15 NOTE — Telephone Encounter (Signed)
Discussed with patient and she verbalized understanding, she is coming in today to have her TSH repeated. She has agreed to take the Vitamin D and start a MVI with iron. Rx mailed and apt scheduled. Copy mailed to the patient.      KP

## 2014-08-16 ENCOUNTER — Ambulatory Visit (HOSPITAL_BASED_OUTPATIENT_CLINIC_OR_DEPARTMENT_OTHER): Payer: Medicare Other

## 2014-08-16 DIAGNOSIS — I10 Essential (primary) hypertension: Secondary | ICD-10-CM | POA: Diagnosis not present

## 2014-08-16 LAB — T4, FREE: FREE T4: 0.88 ng/dL (ref 0.60–1.60)

## 2014-08-16 LAB — T3, FREE: T3, Free: 3.3 pg/mL (ref 2.3–4.2)

## 2014-08-16 LAB — TSH: TSH: 6.42 u[IU]/mL — AB (ref 0.35–4.50)

## 2014-08-17 DIAGNOSIS — E039 Hypothyroidism, unspecified: Secondary | ICD-10-CM

## 2014-08-17 DIAGNOSIS — I10 Essential (primary) hypertension: Secondary | ICD-10-CM | POA: Diagnosis not present

## 2014-08-17 MED ORDER — SYNTHROID 50 MCG PO TABS: 50.0000 ug | ORAL_TABLET | Freq: Every day | ORAL | Status: DC

## 2014-08-18 ENCOUNTER — Ambulatory Visit (HOSPITAL_BASED_OUTPATIENT_CLINIC_OR_DEPARTMENT_OTHER)
Admission: RE | Admit: 2014-08-18 | Discharge: 2014-08-18 | Disposition: A | Discharge status: Home / Self Care | Payer: Medicare Other | Source: Ambulatory Visit | Attending: General Surgery | Admitting: General Surgery

## 2014-08-18 DIAGNOSIS — K760 Fatty (change of) liver, not elsewhere classified: Secondary | ICD-10-CM | POA: Diagnosis not present

## 2014-08-18 DIAGNOSIS — D1739 Benign lipomatous neoplasm of skin and subcutaneous tissue of other sites: Secondary | ICD-10-CM | POA: Diagnosis not present

## 2014-08-18 DIAGNOSIS — R160 Hepatomegaly, not elsewhere classified: Secondary | ICD-10-CM | POA: Diagnosis not present

## 2014-08-18 DIAGNOSIS — D259 Leiomyoma of uterus, unspecified: Secondary | ICD-10-CM | POA: Insufficient documentation

## 2014-08-18 DIAGNOSIS — K429 Umbilical hernia without obstruction or gangrene: Secondary | ICD-10-CM | POA: Insufficient documentation

## 2014-08-18 DIAGNOSIS — D1779 Benign lipomatous neoplasm of other sites: Secondary | ICD-10-CM | POA: Diagnosis not present

## 2014-08-18 DIAGNOSIS — R1900 Intra-abdominal and pelvic swelling, mass and lump, unspecified site: Secondary | ICD-10-CM | POA: Diagnosis present

## 2014-08-20 DIAGNOSIS — I10 Essential (primary) hypertension: Secondary | ICD-10-CM | POA: Diagnosis not present

## 2014-08-21 DIAGNOSIS — I10 Essential (primary) hypertension: Secondary | ICD-10-CM | POA: Diagnosis not present

## 2014-08-22 DIAGNOSIS — I10 Essential (primary) hypertension: Secondary | ICD-10-CM | POA: Diagnosis not present

## 2014-08-23 ENCOUNTER — Telehealth: Payer: Self-pay | Admitting: Family Medicine

## 2014-08-23 DIAGNOSIS — I10 Essential (primary) hypertension: Secondary | ICD-10-CM | POA: Diagnosis not present

## 2014-08-23 NOTE — Telephone Encounter (Signed)
Caller name: Chesnie Relation to pt: self Call back number: [phone] Pharmacy: [facility] [address]  Reason for call:   Patient states that the pharmacy did not receive the synthroid refill. Please resend

## 2014-08-23 NOTE — Telephone Encounter (Signed)
Called pharmacy to verify.  Pharmacist states that Rx was received on 08/17/14 and that prescription was picked up on 08/19/14.  Called patient and left a message for call back to verify that right medication was requested.

## 2014-08-23 NOTE — Telephone Encounter (Signed)
Patient returned phone call. Best # [phone]. Please call around 5pm

## 2014-08-24 DIAGNOSIS — I10 Essential (primary) hypertension: Secondary | ICD-10-CM | POA: Diagnosis not present

## 2014-08-24 NOTE — Telephone Encounter (Signed)
Pt states she has medication and apologized for the confusion.  No further needs at this time.

## 2014-08-27 DIAGNOSIS — I10 Essential (primary) hypertension: Secondary | ICD-10-CM | POA: Diagnosis not present

## 2014-08-28 DIAGNOSIS — I10 Essential (primary) hypertension: Secondary | ICD-10-CM | POA: Diagnosis not present

## 2014-08-29 DIAGNOSIS — I10 Essential (primary) hypertension: Secondary | ICD-10-CM | POA: Diagnosis not present

## 2014-08-30 DIAGNOSIS — I10 Essential (primary) hypertension: Secondary | ICD-10-CM | POA: Diagnosis not present

## 2014-08-31 DIAGNOSIS — I10 Essential (primary) hypertension: Secondary | ICD-10-CM | POA: Diagnosis not present

## 2014-09-03 DIAGNOSIS — I10 Essential (primary) hypertension: Secondary | ICD-10-CM | POA: Diagnosis not present

## 2014-09-04 DIAGNOSIS — I10 Essential (primary) hypertension: Secondary | ICD-10-CM | POA: Diagnosis not present

## 2014-09-05 DIAGNOSIS — I10 Essential (primary) hypertension: Secondary | ICD-10-CM | POA: Diagnosis not present

## 2014-09-06 DIAGNOSIS — I10 Essential (primary) hypertension: Secondary | ICD-10-CM | POA: Diagnosis not present

## 2014-09-07 DIAGNOSIS — I10 Essential (primary) hypertension: Secondary | ICD-10-CM | POA: Diagnosis not present

## 2014-09-11 ENCOUNTER — Encounter: Payer: Self-pay | Admitting: Neurology

## 2014-09-11 ENCOUNTER — Ambulatory Visit (INDEPENDENT_AMBULATORY_CARE_PROVIDER_SITE_OTHER): Payer: Medicare Other | Admitting: Neurology

## 2014-09-11 VITALS — BP 122/90 | HR 98 | Resp 18 | Ht 61.0 in | Wt 294.0 lb

## 2014-09-11 DIAGNOSIS — R2681 Unsteadiness on feet: Secondary | ICD-10-CM | POA: Diagnosis not present

## 2014-09-11 DIAGNOSIS — R296 Repeated falls: Secondary | ICD-10-CM | POA: Diagnosis not present

## 2014-09-11 DIAGNOSIS — I10 Essential (primary) hypertension: Secondary | ICD-10-CM | POA: Diagnosis not present

## 2014-09-11 DIAGNOSIS — H8111 Benign paroxysmal vertigo, right ear: Secondary | ICD-10-CM

## 2014-09-11 NOTE — Patient Instructions (Addendum)
1. Refer for Vestibular Therapy and Balance Therapy 2. Continue all your medications 3. Follow-up in 3 months
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
## 2014-09-12 DIAGNOSIS — I10 Essential (primary) hypertension: Secondary | ICD-10-CM | POA: Diagnosis not present

## 2014-09-13 DIAGNOSIS — I10 Essential (primary) hypertension: Secondary | ICD-10-CM | POA: Diagnosis not present

## 2014-09-14 DIAGNOSIS — I10 Essential (primary) hypertension: Secondary | ICD-10-CM | POA: Diagnosis not present

## 2014-09-16 ENCOUNTER — Encounter: Payer: Self-pay | Admitting: Neurology

## 2014-09-16 DIAGNOSIS — H8110 Benign paroxysmal vertigo, unspecified ear: Secondary | ICD-10-CM | POA: Insufficient documentation

## 2014-09-16 DIAGNOSIS — R2681 Unsteadiness on feet: Secondary | ICD-10-CM | POA: Insufficient documentation

## 2014-09-16 DIAGNOSIS — R296 Repeated falls: Secondary | ICD-10-CM | POA: Insufficient documentation

## 2014-09-17 DIAGNOSIS — I10 Essential (primary) hypertension: Secondary | ICD-10-CM | POA: Diagnosis not present

## 2014-09-18 ENCOUNTER — Encounter: Payer: Self-pay | Admitting: Rehabilitation

## 2014-09-18 ENCOUNTER — Ambulatory Visit: Payer: Medicare Other | Attending: Neurology | Admitting: Rehabilitation

## 2014-09-18 DIAGNOSIS — R42 Dizziness and giddiness: Secondary | ICD-10-CM

## 2014-09-18 DIAGNOSIS — I10 Essential (primary) hypertension: Secondary | ICD-10-CM | POA: Diagnosis not present
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
## 2014-09-19 DIAGNOSIS — I10 Essential (primary) hypertension: Secondary | ICD-10-CM | POA: Diagnosis not present

## 2014-09-20 DIAGNOSIS — I10 Essential (primary) hypertension: Secondary | ICD-10-CM | POA: Diagnosis not present

## 2014-09-21 DIAGNOSIS — I10 Essential (primary) hypertension: Secondary | ICD-10-CM | POA: Diagnosis not present

## 2014-09-24 DIAGNOSIS — I10 Essential (primary) hypertension: Secondary | ICD-10-CM | POA: Diagnosis not present

## 2014-09-25 ENCOUNTER — Ambulatory Visit: Payer: Medicare Other | Admitting: Physical Therapy

## 2014-09-25 DIAGNOSIS — I10 Essential (primary) hypertension: Secondary | ICD-10-CM | POA: Diagnosis not present

## 2014-09-25 DIAGNOSIS — R42 Dizziness and giddiness: Secondary | ICD-10-CM | POA: Diagnosis not present

## 2014-09-25 NOTE — Patient Instructions (Signed)
Functional Quadriceps: Sit to Stand   Stand up and maintain position until symptoms subside plus 30 seconds.  Return to sitting.  After 30 seconds repeat process.   Perform 5 repetitions; 3-5 times per day. http://orth.exer.us/735   Copyright  VHI. All rights reserved.   Special Instructions: Exercises may bring on mild to moderate symptoms of dizziness/lightheadedness that resolve within 30 minutes of completing exercises. If symptoms are lasting longer than 30 minutes, modify your exercises by:  >decreasing the # of times you complete each activity >ensuring your symptoms return to baseline before moving onto the next exercise >doing them once a day until symptoms improve   Laureen Abrahams, PT, DPT 09/25/2014 3:21 PM  [facility] [address] [place], [place] [address]  [phone] (office) [phone] (fax)
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
## 2014-09-26 ENCOUNTER — Telehealth: Payer: Self-pay | Admitting: Family Medicine

## 2014-09-26 DIAGNOSIS — I10 Essential (primary) hypertension: Secondary | ICD-10-CM | POA: Diagnosis not present

## 2014-09-26 NOTE — Telephone Encounter (Signed)
Caller name: Kelly [name] Relation to pt: self Call back number: [phone] Pharmacy: [facility] on [address]  Reason for call:   Patient states that she has had an odor underneath her arm since she lost ability to use left arm and hand years ago. Wants to know if there is anything that could be prescribed for this?

## 2014-09-27 ENCOUNTER — Ambulatory Visit: Payer: Medicare Other | Admitting: Physical Therapy

## 2014-09-27 DIAGNOSIS — R42 Dizziness and giddiness: Secondary | ICD-10-CM

## 2014-09-27 DIAGNOSIS — I10 Essential (primary) hypertension: Secondary | ICD-10-CM | POA: Diagnosis not present

## 2014-09-27 NOTE — Telephone Encounter (Signed)
Spoke with patient and she stated she has an odor under her left arm because it does not get air,she said the area smelled bad and was tender to touch. She put Neosporin on it and she said today it is better. I advised she would need to be evaluated by a provider to ensure they are aware what they are treating and she verbalized understanding, she said since it is better today she will not schedule for today but if her symptoms return she would call in to be seen. I offered again and she said she was ok but she would be sure to call back if anything changed.     KP
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
## 2014-09-28 DIAGNOSIS — I10 Essential (primary) hypertension: Secondary | ICD-10-CM | POA: Diagnosis not present

## 2014-10-01 DIAGNOSIS — I10 Essential (primary) hypertension: Secondary | ICD-10-CM | POA: Diagnosis not present

## 2014-10-02 DIAGNOSIS — I10 Essential (primary) hypertension: Secondary | ICD-10-CM | POA: Diagnosis not present

## 2014-10-03 DIAGNOSIS — I10 Essential (primary) hypertension: Secondary | ICD-10-CM | POA: Diagnosis not present

## 2014-10-04 ENCOUNTER — Ambulatory Visit: Payer: Medicare Other | Admitting: Physical Therapy

## 2014-10-04 DIAGNOSIS — R42 Dizziness and giddiness: Secondary | ICD-10-CM | POA: Diagnosis not present

## 2014-10-04 DIAGNOSIS — I10 Essential (primary) hypertension: Secondary | ICD-10-CM | POA: Diagnosis not present
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
## 2014-10-05 DIAGNOSIS — I10 Essential (primary) hypertension: Secondary | ICD-10-CM | POA: Diagnosis not present

## 2014-10-08 DIAGNOSIS — I10 Essential (primary) hypertension: Secondary | ICD-10-CM | POA: Diagnosis not present

## 2014-10-08 DIAGNOSIS — M6208 Separation of muscle (nontraumatic), other site: Secondary | ICD-10-CM | POA: Diagnosis not present

## 2014-10-09 DIAGNOSIS — I10 Essential (primary) hypertension: Secondary | ICD-10-CM | POA: Diagnosis not present

## 2014-10-10 DIAGNOSIS — I10 Essential (primary) hypertension: Secondary | ICD-10-CM | POA: Diagnosis not present

## 2014-10-11 DIAGNOSIS — I10 Essential (primary) hypertension: Secondary | ICD-10-CM | POA: Diagnosis not present

## 2014-10-12 DIAGNOSIS — I10 Essential (primary) hypertension: Secondary | ICD-10-CM | POA: Diagnosis not present

## 2014-10-15 DIAGNOSIS — I10 Essential (primary) hypertension: Secondary | ICD-10-CM | POA: Diagnosis not present

## 2014-10-16 DIAGNOSIS — I10 Essential (primary) hypertension: Secondary | ICD-10-CM | POA: Diagnosis not present

## 2014-10-17 ENCOUNTER — Ambulatory Visit (INDEPENDENT_AMBULATORY_CARE_PROVIDER_SITE_OTHER): Payer: Medicare Other | Admitting: Medical

## 2014-10-17 ENCOUNTER — Ambulatory Visit (HOSPITAL_BASED_OUTPATIENT_CLINIC_OR_DEPARTMENT_OTHER)
Admission: RE | Admit: 2014-10-17 | Discharge: 2014-10-17 | Disposition: A | Discharge status: Home / Self Care | Payer: Medicare Other | Source: Ambulatory Visit | Attending: Medical | Admitting: Medical

## 2014-10-17 ENCOUNTER — Encounter: Payer: Self-pay | Admitting: Medical

## 2014-10-17 ENCOUNTER — Other Ambulatory Visit: Payer: Medicare Other

## 2014-10-17 VITALS — BP 132/82 | HR 98 | Temp 98.1°F | Ht 61.0 in | Wt 289.0 lb

## 2014-10-17 DIAGNOSIS — I10 Essential (primary) hypertension: Secondary | ICD-10-CM | POA: Diagnosis not present

## 2014-10-17 DIAGNOSIS — M1611 Unilateral primary osteoarthritis, right hip: Secondary | ICD-10-CM | POA: Insufficient documentation

## 2014-10-17 DIAGNOSIS — M25559 Pain in unspecified hip: Secondary | ICD-10-CM | POA: Insufficient documentation

## 2014-10-17 DIAGNOSIS — M546 Pain in thoracic spine: Secondary | ICD-10-CM | POA: Diagnosis not present

## 2014-10-17 DIAGNOSIS — M25551 Pain in right hip: Secondary | ICD-10-CM

## 2014-10-17 DIAGNOSIS — M419 Scoliosis, unspecified: Secondary | ICD-10-CM | POA: Insufficient documentation

## 2014-10-17 DIAGNOSIS — E039 Hypothyroidism, unspecified: Secondary | ICD-10-CM

## 2014-10-17 DIAGNOSIS — M5431 Sciatica, right side: Secondary | ICD-10-CM | POA: Diagnosis not present

## 2014-10-17 DIAGNOSIS — M2578 Osteophyte, vertebrae: Secondary | ICD-10-CM | POA: Insufficient documentation

## 2014-10-17 DIAGNOSIS — M549 Dorsalgia, unspecified: Secondary | ICD-10-CM | POA: Diagnosis present

## 2014-10-17 LAB — TSH: TSH: 6.78 u[IU]/mL — AB (ref 0.35–4.50)

## 2014-10-17 MED ORDER — TIZANIDINE HCL 4 MG PO CAPS: 4.0000 mg | ORAL_CAPSULE | Freq: Three times a day (TID) | ORAL | Status: DC | PRN

## 2014-10-17 MED ORDER — PREDNISONE 20 MG PO TABS: ORAL_TABLET | ORAL | Status: DC

## 2014-10-17 NOTE — Addendum Note (Signed)
Addended by: Harl Bowie on: 10/17/2014 01:42 PM   Modules accepted: Orders

## 2014-10-17 NOTE — Patient Instructions (Addendum)
Back pain, thoracic Will get tspine xray today. Rx zanaflex. Continue hydrocodone low dose 1 tab po q 6 hrs prn pain. Stop ibuprofen. Start 5 days of prednisone.   Hip pain Xray rt hip. Same med plan as for back.   Sciatica of right side Conservative measures and same med plan as for back and rt hip.     Follow up in 7-10 days or as needed  Rx advisement on zanaflex given. Could just use only at night. Pt expressed understanding.
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
## 2014-10-17 NOTE — Assessment & Plan Note (Signed)
Conservative measures and same med plan as for back and rt hip.

## 2014-10-17 NOTE — Assessment & Plan Note (Signed)
Will get tspine xray today. Rx zanaflex. Continue hydrocodone low dose 1 tab po q 6 hrs prn pain. Stop ibuprofen. Start 5 days of prednisone.

## 2014-10-17 NOTE — Progress Notes (Signed)
Pre visit review using our clinic review tool, if applicable. No additional management support is needed unless otherwise documented below in the visit note. 

## 2014-10-17 NOTE — Assessment & Plan Note (Signed)
Xray rt hip. Same med plan as for back.

## 2014-10-18 DIAGNOSIS — I10 Essential (primary) hypertension: Secondary | ICD-10-CM | POA: Diagnosis not present

## 2014-10-19 ENCOUNTER — Telehealth: Payer: Self-pay | Admitting: Family Medicine

## 2014-10-19 ENCOUNTER — Other Ambulatory Visit: Payer: Self-pay

## 2014-10-19 DIAGNOSIS — I10 Essential (primary) hypertension: Secondary | ICD-10-CM | POA: Diagnosis not present

## 2014-10-19 DIAGNOSIS — E039 Hypothyroidism, unspecified: Secondary | ICD-10-CM

## 2014-10-19 MED ORDER — SYNTHROID 75 MCG PO TABS: 75.0000 ug | ORAL_TABLET | Freq: Every day | ORAL | Status: DC

## 2014-10-19 NOTE — Telephone Encounter (Signed)
Caller name: Debe Relation to pt: self Call back number: Pharmacy:  Reason for call:   Patient returned phone call regarding labs and states that she cannot pick up rx. Pharmacy has to order this and has questions reqarding this.

## 2014-10-22 DIAGNOSIS — I10 Essential (primary) hypertension: Secondary | ICD-10-CM | POA: Diagnosis not present

## 2014-10-22 NOTE — Telephone Encounter (Signed)
Pt returning your call will be leaving the house 2pm

## 2014-10-22 NOTE — Telephone Encounter (Signed)
Msg left to call the office     KP 

## 2014-10-22 NOTE — Telephone Encounter (Signed)
Patient said they did not have the medication available on Friday and it is now available, she will pick up this evening.     KP

## 2014-10-22 NOTE — Telephone Encounter (Signed)
[facility] # [phone]. Returning call.

## 2014-10-23 DIAGNOSIS — I10 Essential (primary) hypertension: Secondary | ICD-10-CM | POA: Diagnosis not present

## 2014-10-24 DIAGNOSIS — I10 Essential (primary) hypertension: Secondary | ICD-10-CM | POA: Diagnosis not present

## 2014-10-25 DIAGNOSIS — I10 Essential (primary) hypertension: Secondary | ICD-10-CM | POA: Diagnosis not present

## 2014-10-26 DIAGNOSIS — I10 Essential (primary) hypertension: Secondary | ICD-10-CM | POA: Diagnosis not present

## 2014-10-29 DIAGNOSIS — I10 Essential (primary) hypertension: Secondary | ICD-10-CM | POA: Diagnosis not present

## 2014-10-30 DIAGNOSIS — I10 Essential (primary) hypertension: Secondary | ICD-10-CM | POA: Diagnosis not present

## 2014-10-31 DIAGNOSIS — I10 Essential (primary) hypertension: Secondary | ICD-10-CM | POA: Diagnosis not present

## 2014-11-01 ENCOUNTER — Other Ambulatory Visit: Payer: Self-pay | Admitting: Family Medicine

## 2014-11-01 ENCOUNTER — Other Ambulatory Visit: Payer: Self-pay

## 2014-11-01 DIAGNOSIS — I10 Essential (primary) hypertension: Secondary | ICD-10-CM | POA: Diagnosis not present

## 2014-11-01 DIAGNOSIS — Z1231 Encounter for screening mammogram for malignant neoplasm of breast: Secondary | ICD-10-CM

## 2014-11-02 DIAGNOSIS — I10 Essential (primary) hypertension: Secondary | ICD-10-CM | POA: Diagnosis not present

## 2014-11-05 DIAGNOSIS — I10 Essential (primary) hypertension: Secondary | ICD-10-CM | POA: Diagnosis not present

## 2014-11-06 DIAGNOSIS — I10 Essential (primary) hypertension: Secondary | ICD-10-CM | POA: Diagnosis not present

## 2014-11-08 DIAGNOSIS — I10 Essential (primary) hypertension: Secondary | ICD-10-CM | POA: Diagnosis not present

## 2014-11-09 DIAGNOSIS — I10 Essential (primary) hypertension: Secondary | ICD-10-CM | POA: Diagnosis not present

## 2014-11-13 ENCOUNTER — Ambulatory Visit (HOSPITAL_BASED_OUTPATIENT_CLINIC_OR_DEPARTMENT_OTHER)
Admission: RE | Admit: 2014-11-13 | Discharge: 2014-11-13 | Disposition: A | Discharge status: Home / Self Care | Payer: Medicare Other | Source: Ambulatory Visit | Attending: Family Medicine | Admitting: Family Medicine

## 2014-11-13 ENCOUNTER — Encounter: Payer: Self-pay | Admitting: Family Medicine

## 2014-11-13 ENCOUNTER — Ambulatory Visit (INDEPENDENT_AMBULATORY_CARE_PROVIDER_SITE_OTHER): Payer: Medicare Other | Admitting: Family Medicine

## 2014-11-13 VITALS — BP 98/62 | HR 103 | Temp 98.7°F | Resp 20 | Ht 61.0 in | Wt 292.0 lb

## 2014-11-13 DIAGNOSIS — I10 Essential (primary) hypertension: Secondary | ICD-10-CM | POA: Diagnosis not present

## 2014-11-13 DIAGNOSIS — R200 Anesthesia of skin: Secondary | ICD-10-CM

## 2014-11-13 DIAGNOSIS — R202 Paresthesia of skin: Secondary | ICD-10-CM

## 2014-11-13 DIAGNOSIS — M47896 Other spondylosis, lumbar region: Secondary | ICD-10-CM | POA: Diagnosis not present

## 2014-11-13 DIAGNOSIS — M62838 Other muscle spasm: Secondary | ICD-10-CM | POA: Diagnosis not present

## 2014-11-13 DIAGNOSIS — M5136 Other intervertebral disc degeneration, lumbar region: Secondary | ICD-10-CM | POA: Insufficient documentation

## 2014-11-13 MED ORDER — GABAPENTIN 100 MG PO CAPS: 100.0000 mg | ORAL_CAPSULE | Freq: Three times a day (TID) | ORAL | Status: DC

## 2014-11-13 MED ORDER — METAXALONE 800 MG PO TABS: 800.0000 mg | ORAL_TABLET | Freq: Three times a day (TID) | ORAL | Status: DC

## 2014-11-13 MED ORDER — VITAMIN D (ERGOCALCIFEROL) 1.25 MG (50000 UNIT) PO CAPS: 50000.0000 [IU] | ORAL_CAPSULE | ORAL | Status: DC
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
## 2014-11-13 NOTE — Patient Instructions (Addendum)
Back Pain, Adult Low back pain is very common. About 1 in 5 people have back pain.The cause of low back pain is rarely dangerous. The pain often gets better over time.About half of people with a sudden onset of back pain feel better in just 2 weeks. About 8 in 10 people feel better by 6 weeks.  CAUSES Some common causes of back pain include:  Strain of the muscles or ligaments supporting the spine.  Wear and tear (degeneration) of the spinal discs.  Arthritis.  Direct injury to the back. DIAGNOSIS Most of the time, the direct cause of low back pain is not known.However, back pain can be treated effectively even when the exact cause of the pain is unknown.Answering your caregiver's questions about your overall health and symptoms is one of the most accurate ways to make sure the cause of your pain is not dangerous. If your caregiver needs more information, he or she may order lab work or imaging tests (X-rays or MRIs).However, even if imaging tests show changes in your back, this usually does not require surgery. HOME CARE INSTRUCTIONS For many people, back pain returns.Since low back pain is rarely dangerous, it is often a condition that people can learn to manageon their own.   Remain active. It is stressful on the back to sit or stand in one place. Do not sit, drive, or stand in one place for more than 30 minutes at a time. Take short walks on level surfaces as soon as pain allows.Try to increase the length of time you walk each day.  Do not stay in bed.Resting more than 1 or 2 days can delay your recovery.  Do not avoid exercise or work.Your body is made to move.It is not dangerous to be active, even though your back may hurt.Your back will likely heal faster if you return to being active before your pain is gone.  Pay attention to your body when you bend and lift. Many people have less discomfortwhen lifting if they bend their knees, keep the load close to their bodies,and  avoid twisting. Often, the most comfortable positions are those that put less stress on your recovering back.  Find a comfortable position to sleep. Use a firm mattress and lie on your side with your knees slightly bent. If you lie on your back, put a pillow under your knees.  Only take over-the-counter or prescription medicines as directed by your caregiver. Over-the-counter medicines to reduce pain and inflammation are often the most helpful.Your caregiver may prescribe muscle relaxant drugs.These medicines help dull your pain so you can more quickly return to your normal activities and healthy exercise.  Put ice on the injured area.  Put ice in a plastic bag.  Place a towel between your skin and the bag.  Leave the ice on for 15-20 minutes, 03-04 times a day for the first 2 to 3 days. After that, ice and heat may be alternated to reduce pain and spasms.  Ask your caregiver about trying back exercises and gentle massage. This may be of some benefit.  Avoid feeling anxious or stressed.Stress increases muscle tension and can worsen back pain.It is important to recognize when you are anxious or stressed and learn ways to manage it.Exercise is a great option. SEEK MEDICAL CARE IF:  You have pain that is not relieved with rest or medicine.  You have pain that does not improve in 1 week.  You have new symptoms.  You are generally not feeling well. SEEK   IMMEDIATE MEDICAL CARE IF:   You have pain that radiates from your back into your legs.  You develop new bowel or bladder control problems.  You have unusual weakness or numbness in your arms or legs.  You develop nausea or vomiting.  You develop abdominal pain.  You feel faint. Document Released: 06/01/2005 Document Revised: 12/01/2011 Document Reviewed: 10/03/2013 Exit[employer] Patient Information 2015 [employer]. This information is not intended to replace advice given to you by your health care provider. Make sure you  discuss any questions you have with your health care provider.  

## 2014-11-14 DIAGNOSIS — I10 Essential (primary) hypertension: Secondary | ICD-10-CM | POA: Diagnosis not present

## 2014-11-15 ENCOUNTER — Other Ambulatory Visit: Payer: Self-pay

## 2014-11-15 ENCOUNTER — Telehealth: Payer: Self-pay | Admitting: Family Medicine

## 2014-11-15 DIAGNOSIS — M5136 Other intervertebral disc degeneration, lumbar region: Secondary | ICD-10-CM

## 2014-11-15 DIAGNOSIS — I10 Essential (primary) hypertension: Secondary | ICD-10-CM | POA: Diagnosis not present

## 2014-11-15 NOTE — Telephone Encounter (Signed)
Notes Recorded by Rosalita Chessman, DO on 11/13/2014 at 7:11 PM Severe degenerative changes We can try PT  If symptoms do not improve-- We will get CT

## 2014-11-15 NOTE — Telephone Encounter (Signed)
Discussed with patient and she has agreed to PT. Order has been placed.     KP

## 2014-11-15 NOTE — Telephone Encounter (Signed)
Caller name: Bryony Kaman Relationship to patient: self Can be reached: [phone], please call before 1:30pm or after 4:00pm   Reason for call: Pt called for result on xray of lumbar spine. Please call her back.

## 2014-11-16 DIAGNOSIS — I10 Essential (primary) hypertension: Secondary | ICD-10-CM | POA: Diagnosis not present

## 2014-11-19 DIAGNOSIS — I10 Essential (primary) hypertension: Secondary | ICD-10-CM | POA: Diagnosis not present

## 2014-11-20 DIAGNOSIS — I10 Essential (primary) hypertension: Secondary | ICD-10-CM | POA: Diagnosis not present

## 2014-11-21 DIAGNOSIS — I10 Essential (primary) hypertension: Secondary | ICD-10-CM | POA: Diagnosis not present

## 2014-11-22 DIAGNOSIS — I10 Essential (primary) hypertension: Secondary | ICD-10-CM | POA: Diagnosis not present

## 2014-11-23 DIAGNOSIS — I10 Essential (primary) hypertension: Secondary | ICD-10-CM | POA: Diagnosis not present

## 2014-11-26 ENCOUNTER — Ambulatory Visit: Payer: Self-pay

## 2014-11-26 DIAGNOSIS — I10 Essential (primary) hypertension: Secondary | ICD-10-CM | POA: Diagnosis not present

## 2014-11-27 ENCOUNTER — Ambulatory Visit
Admission: RE | Admit: 2014-11-27 | Discharge: 2014-11-27 | Disposition: A | Discharge status: Home / Self Care | Payer: Medicare Other | Source: Ambulatory Visit

## 2014-11-27 ENCOUNTER — Telehealth: Payer: Self-pay | Admitting: Family Medicine

## 2014-11-27 DIAGNOSIS — I10 Essential (primary) hypertension: Secondary | ICD-10-CM | POA: Diagnosis not present

## 2014-11-27 DIAGNOSIS — Z1231 Encounter for screening mammogram for malignant neoplasm of breast: Secondary | ICD-10-CM

## 2014-11-27 DIAGNOSIS — R600 Localized edema: Secondary | ICD-10-CM

## 2014-11-27 NOTE — Telephone Encounter (Signed)
Ref placed.      KP 

## 2014-11-27 NOTE — Telephone Encounter (Signed)
Caller name: Nur Rabold Relationship to patient: self Can be reached: [phone] Pharmacy:  Reason for call: Pt said that her foot is still swollen (last visit 11/13/14). She states it hurts to lift it and to put on socks and shoes. She said that ortho was discussed at last appt. Does she need to be seen here first or can we send her to ortho?

## 2014-11-27 NOTE — Telephone Encounter (Signed)
Ok to send her to ortho

## 2014-11-27 NOTE — Telephone Encounter (Signed)
Please advise      KP 

## 2014-11-28 DIAGNOSIS — I10 Essential (primary) hypertension: Secondary | ICD-10-CM | POA: Diagnosis not present

## 2014-11-29 DIAGNOSIS — I10 Essential (primary) hypertension: Secondary | ICD-10-CM | POA: Diagnosis not present

## 2014-11-30 DIAGNOSIS — M25572 Pain in left ankle and joints of left foot: Secondary | ICD-10-CM | POA: Diagnosis not present

## 2014-12-12 ENCOUNTER — Ambulatory Visit (INDEPENDENT_AMBULATORY_CARE_PROVIDER_SITE_OTHER): Payer: Medicare Other | Admitting: Neurology

## 2014-12-12 ENCOUNTER — Encounter: Payer: Self-pay | Admitting: Neurology

## 2014-12-12 VITALS — BP 140/76 | HR 88 | Resp 16 | Ht 61.0 in | Wt 293.2 lb

## 2014-12-12 DIAGNOSIS — H8111 Benign paroxysmal vertigo, right ear: Secondary | ICD-10-CM

## 2014-12-12 NOTE — Patient Instructions (Signed)
1. If vertigo recurs, call Vestibular Therapy for repeat sessions 2. Call our office for any changes, otherwise follow-up on as needed basis
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
## 2014-12-13 ENCOUNTER — Ambulatory Visit (INDEPENDENT_AMBULATORY_CARE_PROVIDER_SITE_OTHER): Payer: Medicare Other | Admitting: Family Medicine

## 2014-12-13 ENCOUNTER — Encounter: Payer: Self-pay | Admitting: Family Medicine

## 2014-12-13 VITALS — BP 120/76 | HR 87 | Temp 97.9°F | Resp 20 | Ht 61.0 in | Wt 293.0 lb

## 2014-12-13 DIAGNOSIS — E039 Hypothyroidism, unspecified: Secondary | ICD-10-CM | POA: Diagnosis not present

## 2014-12-13 DIAGNOSIS — R32 Unspecified urinary incontinence: Secondary | ICD-10-CM

## 2014-12-13 DIAGNOSIS — R82998 Other abnormal findings in urine: Secondary | ICD-10-CM

## 2014-12-13 DIAGNOSIS — N390 Urinary tract infection, site not specified: Secondary | ICD-10-CM | POA: Diagnosis not present

## 2014-12-13 LAB — POCT URINALYSIS DIPSTICK
Bilirubin, UA: NEGATIVE
Glucose, UA: NEGATIVE
KETONES UA: NEGATIVE
Nitrite, UA: NEGATIVE
UROBILINOGEN UA: 2
pH, UA: 5.5

## 2014-12-13 MED ORDER — CIPROFLOXACIN HCL 250 MG PO TABS: 250.0000 mg | ORAL_TABLET | Freq: Two times a day (BID) | ORAL | Status: DC
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
## 2014-12-13 NOTE — Progress Notes (Signed)
Pre visit review using our clinic review tool, if applicable. No additional management support is needed unless otherwise documented below in the visit note. 

## 2014-12-13 NOTE — Patient Instructions (Signed)

## 2014-12-14 LAB — URINE CULTURE
COLONY COUNT: NO GROWTH
Organism ID, Bacteria: NO GROWTH

## 2014-12-14 LAB — TSH: TSH: 3.15 u[IU]/mL (ref 0.35–4.50)

## 2014-12-19 ENCOUNTER — Other Ambulatory Visit: Payer: Medicare Other

## 2014-12-21 DIAGNOSIS — M25572 Pain in left ankle and joints of left foot: Secondary | ICD-10-CM | POA: Diagnosis not present

## 2014-12-21 DIAGNOSIS — R600 Localized edema: Secondary | ICD-10-CM | POA: Diagnosis not present

## 2014-12-27 ENCOUNTER — Other Ambulatory Visit: Payer: Self-pay | Admitting: Sports Medicine

## 2014-12-27 DIAGNOSIS — R600 Localized edema: Secondary | ICD-10-CM

## 2014-12-28 ENCOUNTER — Ambulatory Visit
Admission: RE | Admit: 2014-12-28 | Discharge: 2014-12-28 | Disposition: A | Discharge status: Home / Self Care | Payer: Medicare Other | Source: Ambulatory Visit | Attending: Sports Medicine | Admitting: Sports Medicine

## 2014-12-28 DIAGNOSIS — R600 Localized edema: Secondary | ICD-10-CM

## 2014-12-28 DIAGNOSIS — Z87828 Personal history of other (healed) physical injury and trauma: Secondary | ICD-10-CM | POA: Diagnosis not present

## 2014-12-28 DIAGNOSIS — M7732 Calcaneal spur, left foot: Secondary | ICD-10-CM | POA: Diagnosis not present

## 2015-01-01 ENCOUNTER — Other Ambulatory Visit: Payer: Self-pay

## 2015-01-01 MED ORDER — SYNTHROID 75 MCG PO TABS: 75.0000 ug | ORAL_TABLET | Freq: Every day | ORAL | Status: DC

## 2015-01-09 DIAGNOSIS — R600 Localized edema: Secondary | ICD-10-CM | POA: Diagnosis not present

## 2015-01-09 DIAGNOSIS — M25572 Pain in left ankle and joints of left foot: Secondary | ICD-10-CM | POA: Diagnosis not present

## 2015-01-18 ENCOUNTER — Other Ambulatory Visit: Payer: Self-pay | Admitting: *Deleted

## 2015-01-18 DIAGNOSIS — M7989 Other specified soft tissue disorders: Secondary | ICD-10-CM

## 2015-01-25 ENCOUNTER — Encounter: Payer: Self-pay | Admitting: Family Medicine

## 2015-01-25 ENCOUNTER — Ambulatory Visit (INDEPENDENT_AMBULATORY_CARE_PROVIDER_SITE_OTHER): Payer: Medicare Other | Admitting: Family Medicine

## 2015-01-25 VITALS — BP 103/70 | HR 91 | Temp 97.7°F | Wt 280.0 lb

## 2015-01-25 DIAGNOSIS — I10 Essential (primary) hypertension: Secondary | ICD-10-CM | POA: Diagnosis not present

## 2015-01-25 DIAGNOSIS — E785 Hyperlipidemia, unspecified: Secondary | ICD-10-CM

## 2015-01-25 LAB — LIPID PANEL
CHOLESTEROL: 125 mg/dL (ref 0–200)
HDL: 33.4 mg/dL — AB (ref >39.00)
LDL Cholesterol: 67 mg/dL (ref 0–99)
NonHDL: 91.22
TRIGLYCERIDES: 122 mg/dL (ref 0.0–149.0)
Total CHOL/HDL Ratio: 4
VLDL: 24.4 mg/dL (ref 0.0–40.0)

## 2015-01-25 LAB — HEPATIC FUNCTION PANEL
ALBUMIN: 3.8 g/dL (ref 3.5–5.2)
ALT: 24 U/L (ref 0–35)
AST: 18 U/L (ref 0–37)
Alkaline Phosphatase: 103 U/L (ref 39–117)
BILIRUBIN TOTAL: 1.3 mg/dL — AB (ref 0.2–1.2)
Bilirubin, Direct: 0.3 mg/dL (ref 0.0–0.3)
Total Protein: 6.9 g/dL (ref 6.0–8.3)

## 2015-01-25 MED ORDER — POTASSIUM CHLORIDE CRYS ER 20 MEQ PO TBCR: 20.0000 meq | EXTENDED_RELEASE_TABLET | Freq: Every day | ORAL | Status: DC

## 2015-01-25 NOTE — Assessment & Plan Note (Signed)
con't lipitor  Check lipid, hep

## 2015-01-25 NOTE — Assessment & Plan Note (Signed)
Stable benazapril and lasix
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
## 2015-01-25 NOTE — Progress Notes (Signed)
Pre visit review using our clinic review tool, if applicable. No additional management support is needed unless otherwise documented below in the visit note. 

## 2015-01-25 NOTE — Patient Instructions (Signed)

## 2015-01-30 DIAGNOSIS — M25572 Pain in left ankle and joints of left foot: Secondary | ICD-10-CM | POA: Diagnosis not present

## 2015-01-30 DIAGNOSIS — R600 Localized edema: Secondary | ICD-10-CM | POA: Diagnosis not present

## 2015-02-11 ENCOUNTER — Encounter: Payer: Self-pay | Admitting: Vascular Surgery

## 2015-02-12 ENCOUNTER — Ambulatory Visit (HOSPITAL_COMMUNITY)
Admission: RE | Admit: 2015-02-12 | Discharge: 2015-02-12 | Disposition: A | Discharge status: Home / Self Care | Payer: Medicare Other | Source: Ambulatory Visit | Attending: Vascular Surgery | Admitting: Vascular Surgery

## 2015-02-12 ENCOUNTER — Ambulatory Visit (INDEPENDENT_AMBULATORY_CARE_PROVIDER_SITE_OTHER): Payer: Medicare Other | Admitting: Vascular Surgery

## 2015-02-12 ENCOUNTER — Encounter: Payer: Self-pay | Admitting: Vascular Surgery

## 2015-02-12 VITALS — BP 130/75 | HR 78 | Temp 98.0°F | Ht 61.0 in | Wt 280.0 lb

## 2015-02-12 DIAGNOSIS — R600 Localized edema: Secondary | ICD-10-CM | POA: Diagnosis not present

## 2015-02-12 DIAGNOSIS — I8392 Asymptomatic varicose veins of left lower extremity: Secondary | ICD-10-CM | POA: Diagnosis not present

## 2015-02-12 DIAGNOSIS — M7989 Other specified soft tissue disorders: Secondary | ICD-10-CM | POA: Insufficient documentation
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
## 2015-02-13 DIAGNOSIS — Z961 Presence of intraocular lens: Secondary | ICD-10-CM | POA: Diagnosis not present

## 2015-03-01 DIAGNOSIS — R600 Localized edema: Secondary | ICD-10-CM | POA: Diagnosis not present

## 2015-03-01 DIAGNOSIS — M25572 Pain in left ankle and joints of left foot: Secondary | ICD-10-CM | POA: Diagnosis not present

## 2015-03-07 ENCOUNTER — Other Ambulatory Visit: Payer: Self-pay | Admitting: Family Medicine

## 2015-03-10 ENCOUNTER — Emergency Department (HOSPITAL_BASED_OUTPATIENT_CLINIC_OR_DEPARTMENT_OTHER): Payer: Medicare Other

## 2015-03-10 ENCOUNTER — Encounter (HOSPITAL_BASED_OUTPATIENT_CLINIC_OR_DEPARTMENT_OTHER): Payer: Self-pay

## 2015-03-10 ENCOUNTER — Emergency Department (HOSPITAL_BASED_OUTPATIENT_CLINIC_OR_DEPARTMENT_OTHER)
Admission: EM | Admit: 2015-03-10 | Discharge: 2015-03-10 | Disposition: A | Discharge status: Home / Self Care | Payer: Medicare Other | Attending: Emergency Medicine | Admitting: Emergency Medicine

## 2015-03-10 DIAGNOSIS — Z87891 Personal history of nicotine dependence: Secondary | ICD-10-CM | POA: Insufficient documentation

## 2015-03-10 DIAGNOSIS — Z7951 Long term (current) use of inhaled steroids: Secondary | ICD-10-CM | POA: Diagnosis not present

## 2015-03-10 DIAGNOSIS — Z8669 Personal history of other diseases of the nervous system and sense organs: Secondary | ICD-10-CM | POA: Diagnosis not present

## 2015-03-10 DIAGNOSIS — S92412A Displaced fracture of proximal phalanx of left great toe, initial encounter for closed fracture: Secondary | ICD-10-CM | POA: Diagnosis not present

## 2015-03-10 DIAGNOSIS — Z79899 Other long term (current) drug therapy: Secondary | ICD-10-CM | POA: Insufficient documentation

## 2015-03-10 DIAGNOSIS — W1839XA Other fall on same level, initial encounter: Secondary | ICD-10-CM | POA: Diagnosis not present

## 2015-03-10 DIAGNOSIS — I10 Essential (primary) hypertension: Secondary | ICD-10-CM | POA: Insufficient documentation

## 2015-03-10 DIAGNOSIS — E785 Hyperlipidemia, unspecified: Secondary | ICD-10-CM | POA: Diagnosis not present

## 2015-03-10 DIAGNOSIS — Y9289 Other specified places as the place of occurrence of the external cause: Secondary | ICD-10-CM | POA: Insufficient documentation

## 2015-03-10 DIAGNOSIS — Z86711 Personal history of pulmonary embolism: Secondary | ICD-10-CM | POA: Insufficient documentation

## 2015-03-10 DIAGNOSIS — M1990 Unspecified osteoarthritis, unspecified site: Secondary | ICD-10-CM | POA: Insufficient documentation

## 2015-03-10 DIAGNOSIS — S299XXA Unspecified injury of thorax, initial encounter: Secondary | ICD-10-CM | POA: Diagnosis not present

## 2015-03-10 DIAGNOSIS — Z853 Personal history of malignant neoplasm of breast: Secondary | ICD-10-CM | POA: Diagnosis not present

## 2015-03-10 DIAGNOSIS — Z7982 Long term (current) use of aspirin: Secondary | ICD-10-CM | POA: Diagnosis not present

## 2015-03-10 DIAGNOSIS — S92912A Unspecified fracture of left toe(s), initial encounter for closed fracture: Secondary | ICD-10-CM

## 2015-03-10 DIAGNOSIS — S99922A Unspecified injury of left foot, initial encounter: Secondary | ICD-10-CM | POA: Diagnosis present

## 2015-03-10 DIAGNOSIS — R52 Pain, unspecified: Secondary | ICD-10-CM

## 2015-03-10 DIAGNOSIS — Y9389 Activity, other specified: Secondary | ICD-10-CM | POA: Diagnosis not present

## 2015-03-10 DIAGNOSIS — S92411A Displaced fracture of proximal phalanx of right great toe, initial encounter for closed fracture: Secondary | ICD-10-CM | POA: Diagnosis not present

## 2015-03-10 DIAGNOSIS — Y998 Other external cause status: Secondary | ICD-10-CM | POA: Diagnosis not present

## 2015-03-10 DIAGNOSIS — R079 Chest pain, unspecified: Secondary | ICD-10-CM | POA: Diagnosis not present

## 2015-03-10 MED ORDER — IBUPROFEN 400 MG PO TABS
400.0000 mg | ORAL_TABLET | Freq: Once | ORAL | Status: AC
  Administered 2015-03-10: 400 mg via ORAL
  Filled 2015-03-10: qty 1

## 2015-03-10 MED ORDER — ONDANSETRON 4 MG PO TBDP
4.0000 mg | ORAL_TABLET | Freq: Once | ORAL | Status: DC
  Filled 2015-03-10: qty 1

## 2015-03-10 MED ORDER — OXYCODONE HCL 5 MG PO TABS: 2.5000 mg | ORAL_TABLET | ORAL | Status: DC | PRN

## 2015-03-10 MED ORDER — ACETAMINOPHEN 500 MG PO TABS
1000.0000 mg | ORAL_TABLET | Freq: Once | ORAL | Status: AC
Start: 2015-03-10 — End: 2015-03-10
  Administered 2015-03-10: 1000 mg via ORAL
  Filled 2015-03-10: qty 2

## 2015-03-10 MED ORDER — OXYCODONE HCL 5 MG PO TABS
5.0000 mg | ORAL_TABLET | Freq: Once | ORAL | Status: DC
  Filled 2015-03-10: qty 1

## 2015-03-10 NOTE — ED Notes (Signed)
Patient here with fall on Friday, reports that she lost her balance. Right chest wall pain and right foot pain, no loc

## 2015-03-10 NOTE — Discharge Instructions (Signed)
Buddy Taping of Toes °We have taped your toes together to keep them from moving. This is called "buddy taping" since we used a part of your own body to keep the injured part still. We placed soft padding between your toes to keep them from rubbing against each other. Buddy taping will help with healing and to reduce pain. Keep your toes buddy taped together for as long as directed by your caregiver. °HOME CARE INSTRUCTIONS  °· Raise your injured area above the level of your heart while sitting or lying down. Prop it up with pillows. °· An ice pack used every twenty minutes, while awake, for the first one to two days may be helpful. Put ice in a plastic bag and put a towel between the bag and your skin. °· Watch for signs that the taping is too tight. These signs may be: °¨ Numbness of your taped toes. °¨ Coolness of your taped toes. °¨ Color change in the area beyond the tape. °¨ Increased pain. °· If you have any of these signs, loosen or rewrap the tape. If you need to loosen or rewrap the buddy tape, make sure you use the padding again. °SEEK IMMEDIATE MEDICAL CARE IF:  °· You have worse pain, swelling, inflammation (soreness), drainage or bleeding after you rewrap the tape. °· Any new problems occur. °MAKE SURE YOU:  °· Understand these instructions. °· Will watch your condition. °· Will get help right away if you are not doing well or get worse. °Document Released: 03/05/2004 Document Revised: 08/24/2011 Document Reviewed: 05/29/2008 °[employer]® Patient Information ©2015 [employer]. This information is not intended to replace advice given to you by your health care provider. Make sure you discuss any questions you have with your health care provider. ° °
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
## 2015-03-12 ENCOUNTER — Ambulatory Visit (INDEPENDENT_AMBULATORY_CARE_PROVIDER_SITE_OTHER): Payer: Medicare Other

## 2015-03-12 DIAGNOSIS — Z23 Encounter for immunization: Secondary | ICD-10-CM

## 2015-03-12 NOTE — Progress Notes (Signed)
Pre visit review using our clinic review tool, if applicable. No additional management support is needed unless otherwise documented below in the visit note. 

## 2015-03-15 ENCOUNTER — Ambulatory Visit (INDEPENDENT_AMBULATORY_CARE_PROVIDER_SITE_OTHER): Payer: Medicare Other | Admitting: Family Medicine

## 2015-03-15 ENCOUNTER — Encounter: Payer: Self-pay | Admitting: Family Medicine

## 2015-03-15 VITALS — BP 116/68 | HR 76 | Temp 97.8°F | Ht 61.0 in | Wt 286.2 lb

## 2015-03-15 DIAGNOSIS — M79672 Pain in left foot: Secondary | ICD-10-CM | POA: Diagnosis not present

## 2015-03-15 DIAGNOSIS — M545 Low back pain, unspecified: Secondary | ICD-10-CM

## 2015-03-15 DIAGNOSIS — S92911A Unspecified fracture of right toe(s), initial encounter for closed fracture: Secondary | ICD-10-CM | POA: Diagnosis not present

## 2015-03-15 DIAGNOSIS — S92919A Unspecified fracture of unspecified toe(s), initial encounter for closed fracture: Secondary | ICD-10-CM | POA: Insufficient documentation

## 2015-03-15 MED ORDER — HYDROCODONE-ACETAMINOPHEN 5-325 MG PO TABS: 1.0000 | ORAL_TABLET | Freq: Four times a day (QID) | ORAL | Status: DC | PRN
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
## 2015-03-15 NOTE — Assessment & Plan Note (Addendum)
Minimally displaced Refer to ortho---dr hewitt to see pt October       con't with boot

## 2015-03-15 NOTE — Progress Notes (Signed)
Pre visit review using our clinic review tool, if applicable. No additional management support is needed unless otherwise documented below in the visit note. 

## 2015-03-27 DIAGNOSIS — S92424A Nondisplaced fracture of distal phalanx of right great toe, initial encounter for closed fracture: Secondary | ICD-10-CM | POA: Diagnosis not present

## 2015-03-27 DIAGNOSIS — M25475 Effusion, left foot: Secondary | ICD-10-CM | POA: Diagnosis not present

## 2015-03-29 DIAGNOSIS — M1990 Unspecified osteoarthritis, unspecified site: Secondary | ICD-10-CM | POA: Diagnosis not present

## 2015-03-29 DIAGNOSIS — M6281 Muscle weakness (generalized): Secondary | ICD-10-CM | POA: Diagnosis not present

## 2015-03-29 DIAGNOSIS — G8190 Hemiplegia, unspecified affecting unspecified side: Secondary | ICD-10-CM | POA: Diagnosis not present

## 2015-03-29 DIAGNOSIS — Z9181 History of falling: Secondary | ICD-10-CM | POA: Diagnosis not present

## 2015-04-18 ENCOUNTER — Telehealth: Payer: Self-pay

## 2015-04-18 NOTE — Telephone Encounter (Signed)
Schedule AWV.  

## 2015-04-18 NOTE — Telephone Encounter (Signed)
Noted  

## 2015-04-18 NOTE — Telephone Encounter (Signed)
Patient scheduled for 08/02/2015 at 9:30am.

## 2015-06-26 ENCOUNTER — Other Ambulatory Visit: Payer: Self-pay | Admitting: Family Medicine

## 2015-06-27 ENCOUNTER — Other Ambulatory Visit: Payer: Self-pay | Admitting: Family Medicine

## 2015-07-16 ENCOUNTER — Encounter (HOSPITAL_COMMUNITY): Payer: Self-pay

## 2015-07-16 ENCOUNTER — Observation Stay (HOSPITAL_COMMUNITY)
Admission: EM | Admit: 2015-07-16 | Discharge: 2015-07-18 | Disposition: A | Discharge status: Home / Self Care | Payer: Medicare Other | Attending: Internal Medicine | Admitting: Internal Medicine

## 2015-07-16 DIAGNOSIS — M1990 Unspecified osteoarthritis, unspecified site: Secondary | ICD-10-CM | POA: Insufficient documentation

## 2015-07-16 DIAGNOSIS — G4700 Insomnia, unspecified: Secondary | ICD-10-CM | POA: Diagnosis not present

## 2015-07-16 DIAGNOSIS — Z7951 Long term (current) use of inhaled steroids: Secondary | ICD-10-CM | POA: Diagnosis not present

## 2015-07-16 DIAGNOSIS — I10 Essential (primary) hypertension: Secondary | ICD-10-CM | POA: Diagnosis present

## 2015-07-16 DIAGNOSIS — Z79899 Other long term (current) drug therapy: Secondary | ICD-10-CM | POA: Insufficient documentation

## 2015-07-16 DIAGNOSIS — R0789 Other chest pain: Secondary | ICD-10-CM | POA: Diagnosis not present

## 2015-07-16 DIAGNOSIS — Z853 Personal history of malignant neoplasm of breast: Secondary | ICD-10-CM | POA: Diagnosis not present

## 2015-07-16 DIAGNOSIS — Z86711 Personal history of pulmonary embolism: Secondary | ICD-10-CM | POA: Diagnosis not present

## 2015-07-16 DIAGNOSIS — Z7982 Long term (current) use of aspirin: Secondary | ICD-10-CM | POA: Insufficient documentation

## 2015-07-16 DIAGNOSIS — E039 Hypothyroidism, unspecified: Secondary | ICD-10-CM | POA: Diagnosis present

## 2015-07-16 DIAGNOSIS — E279 Disorder of adrenal gland, unspecified: Secondary | ICD-10-CM | POA: Diagnosis not present

## 2015-07-16 DIAGNOSIS — R079 Chest pain, unspecified: Secondary | ICD-10-CM | POA: Diagnosis not present

## 2015-07-16 DIAGNOSIS — Z87891 Personal history of nicotine dependence: Secondary | ICD-10-CM | POA: Diagnosis not present

## 2015-07-16 DIAGNOSIS — H269 Unspecified cataract: Secondary | ICD-10-CM | POA: Diagnosis not present

## 2015-07-16 DIAGNOSIS — E785 Hyperlipidemia, unspecified: Secondary | ICD-10-CM | POA: Diagnosis not present

## 2015-07-16 HISTORY — DX: Dizziness and giddiness: R42

## 2015-07-16 LAB — CBC
HEMATOCRIT: 36.6 % (ref 36.0–46.0)
HEMOGLOBIN: 11.6 g/dL — AB (ref 12.0–15.0)
MCH: 27.9 pg (ref 26.0–34.0)
MCHC: 31.7 g/dL (ref 30.0–36.0)
MCV: 88 fL (ref 78.0–100.0)
Platelets: 190 10*3/uL (ref 150–400)
RBC: 4.16 MIL/uL (ref 3.87–5.11)
RDW: 16 % — ABNORMAL HIGH (ref 11.5–15.5)
WBC: 8.2 10*3/uL (ref 4.0–10.5)

## 2015-07-16 NOTE — ED Notes (Addendum)
Pt from home by [facility] with CP that started about two hours ago. Pt took 325ASA at home and 2 nitro with EMS. Pt rates pain 8-10 in the left upper chest radiating to left arm. Pt states that the medications helped with the pain some but did not take it away.
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
## 2015-07-16 NOTE — ED Notes (Signed)
Pt vomited about 289mL during triage. Pt states this is the first time she has vomited tonight.

## 2015-07-17 ENCOUNTER — Encounter (HOSPITAL_COMMUNITY): Payer: Self-pay | Admitting: Internal Medicine

## 2015-07-17 ENCOUNTER — Emergency Department (HOSPITAL_COMMUNITY): Payer: Medicare Other

## 2015-07-17 ENCOUNTER — Observation Stay (HOSPITAL_BASED_OUTPATIENT_CLINIC_OR_DEPARTMENT_OTHER): Payer: Medicare Other

## 2015-07-17 ENCOUNTER — Observation Stay (HOSPITAL_COMMUNITY): Payer: Medicare Other

## 2015-07-17 DIAGNOSIS — R079 Chest pain, unspecified: Secondary | ICD-10-CM

## 2015-07-17 DIAGNOSIS — I10 Essential (primary) hypertension: Secondary | ICD-10-CM

## 2015-07-17 DIAGNOSIS — E039 Hypothyroidism, unspecified: Secondary | ICD-10-CM | POA: Diagnosis not present

## 2015-07-17 DIAGNOSIS — R0789 Other chest pain: Secondary | ICD-10-CM

## 2015-07-17 DIAGNOSIS — E785 Hyperlipidemia, unspecified: Secondary | ICD-10-CM | POA: Diagnosis not present

## 2015-07-17 LAB — TROPONIN I
Troponin I: <0.03 ng/mL (ref <0.031)
Troponin I: <0.03 ng/mL (ref <0.031)
Troponin I: <0.03 ng/mL (ref <0.031)

## 2015-07-17 LAB — BASIC METABOLIC PANEL WITH GFR
Anion gap: 11 (ref 5–15)
BUN: 14 mg/dL (ref 6–20)
CO2: 29 mmol/L (ref 22–32)
Calcium: 9.2 mg/dL (ref 8.9–10.3)
Chloride: 104 mmol/L (ref 101–111)
Creatinine, Ser: 0.61 mg/dL (ref 0.44–1.00)
GFR calc Af Amer: >60 mL/min
GFR calc non Af Amer: >60 mL/min
Glucose, Bld: 133 mg/dL — ABNORMAL HIGH (ref 65–99)
Potassium: 3.7 mmol/L (ref 3.5–5.1)
Sodium: 144 mmol/L (ref 135–145)

## 2015-07-17 LAB — I-STAT TROPONIN, ED: Troponin i, poc: 0 ng/mL (ref 0.00–0.08)

## 2015-07-17 LAB — D-DIMER, QUANTITATIVE: D-Dimer, Quant: 0.5 ug/mL-FEU (ref 0.00–0.50)

## 2015-07-17 MED ORDER — GABAPENTIN 100 MG PO CAPS
100.0000 mg | ORAL_CAPSULE | Freq: Three times a day (TID) | ORAL | Status: DC
  Administered 2015-07-17 – 2015-07-18 (×5): 100 mg via ORAL
  Filled 2015-07-17 (×5): qty 1

## 2015-07-17 MED ORDER — HYDROCODONE-ACETAMINOPHEN 5-325 MG PO TABS
1.0000 | ORAL_TABLET | Freq: Four times a day (QID) | ORAL | Status: DC | PRN
  Administered 2015-07-17 – 2015-07-18 (×2): 1 via ORAL
  Filled 2015-07-17 (×2): qty 1

## 2015-07-17 MED ORDER — LEVOTHYROXINE SODIUM 75 MCG PO TABS
75.0000 ug | ORAL_TABLET | Freq: Every day | ORAL | Status: DC
  Administered 2015-07-17 – 2015-07-18 (×2): 75 ug via ORAL
  Filled 2015-07-17 (×2): qty 1

## 2015-07-17 MED ORDER — BENAZEPRIL HCL 10 MG PO TABS
10.0000 mg | ORAL_TABLET | Freq: Every day | ORAL | Status: DC
  Administered 2015-07-17 – 2015-07-18 (×2): 10 mg via ORAL
  Filled 2015-07-17 (×2): qty 1

## 2015-07-17 MED ORDER — ATORVASTATIN CALCIUM 20 MG PO TABS
20.0000 mg | ORAL_TABLET | Freq: Every day | ORAL | Status: DC
  Administered 2015-07-17 – 2015-07-18 (×2): 20 mg via ORAL
  Filled 2015-07-17 (×2): qty 1

## 2015-07-17 MED ORDER — ONDANSETRON HCL 4 MG/2ML IJ SOLN: 4.0000 mg | Freq: Four times a day (QID) | INTRAMUSCULAR | Status: DC | PRN

## 2015-07-17 MED ORDER — ACETAMINOPHEN 325 MG PO TABS: 650.0000 mg | ORAL_TABLET | ORAL | Status: DC | PRN

## 2015-07-17 MED ORDER — OMEGA-3-ACID ETHYL ESTERS 1 G PO CAPS
1.0000 g | ORAL_CAPSULE | Freq: Every day | ORAL | Status: DC
  Administered 2015-07-17 – 2015-07-18 (×2): 1 g via ORAL
  Filled 2015-07-17 (×2): qty 1

## 2015-07-17 MED ORDER — KETOROLAC TROMETHAMINE 30 MG/ML IJ SOLN
30.0000 mg | Freq: Once | INTRAMUSCULAR | Status: AC
  Administered 2015-07-17: 30 mg via INTRAVENOUS
  Filled 2015-07-17: qty 1

## 2015-07-17 MED ORDER — FUROSEMIDE 40 MG PO TABS
40.0000 mg | ORAL_TABLET | Freq: Every day | ORAL | Status: DC
  Administered 2015-07-17 – 2015-07-18 (×2): 40 mg via ORAL
  Filled 2015-07-17 (×2): qty 1

## 2015-07-17 MED ORDER — ASPIRIN EC 325 MG PO TBEC
325.0000 mg | DELAYED_RELEASE_TABLET | Freq: Every day | ORAL | Status: DC
  Administered 2015-07-17: 325 mg via ORAL
  Filled 2015-07-17 (×2): qty 1

## 2015-07-17 MED ORDER — MORPHINE SULFATE (PF) 2 MG/ML IV SOLN: 2.0000 mg | INTRAVENOUS | Status: DC | PRN

## 2015-07-17 MED ORDER — NITROGLYCERIN 0.4 MG SL SUBL: 0.4000 mg | SUBLINGUAL_TABLET | SUBLINGUAL | Status: DC | PRN

## 2015-07-17 MED ORDER — FLUTICASONE PROPIONATE 50 MCG/ACT NA SUSP
2.0000 | Freq: Every day | NASAL | Status: DC
  Administered 2015-07-17 – 2015-07-18 (×2): 2 via NASAL
  Filled 2015-07-17: qty 16

## 2015-07-17 MED ORDER — POTASSIUM CHLORIDE CRYS ER 20 MEQ PO TBCR
20.0000 meq | EXTENDED_RELEASE_TABLET | Freq: Every day | ORAL | Status: DC
  Administered 2015-07-17 – 2015-07-18 (×2): 20 meq via ORAL
  Filled 2015-07-17 (×2): qty 1

## 2015-07-17 MED ORDER — ENOXAPARIN SODIUM 40 MG/0.4ML ~~LOC~~ SOLN
40.0000 mg | SUBCUTANEOUS | Status: DC
Start: 2015-07-17 — End: 2015-07-18
  Administered 2015-07-17 – 2015-07-18 (×2): 40 mg via SUBCUTANEOUS
  Filled 2015-07-17 (×2): qty 0.4

## 2015-07-17 MED ORDER — VITAMIN D 1000 UNITS PO TABS
4000.0000 [IU] | ORAL_TABLET | Freq: Every day | ORAL | Status: DC
  Administered 2015-07-17 – 2015-07-18 (×2): 4000 [IU] via ORAL
  Filled 2015-07-17 (×2): qty 4

## 2015-07-17 MED ORDER — REGADENOSON 0.4 MG/5ML IV SOLN
INTRAVENOUS | Status: AC
  Filled 2015-07-17: qty 5

## 2015-07-17 MED ORDER — ADULT MULTIVITAMIN W/MINERALS CH
1.0000 | ORAL_TABLET | Freq: Every day | ORAL | Status: DC
Start: 2015-07-17 — End: 2015-07-18
  Administered 2015-07-17 – 2015-07-18 (×2): 1 via ORAL
  Filled 2015-07-17 (×2): qty 1

## 2015-07-17 MED ORDER — LORATADINE 10 MG PO TABS
10.0000 mg | ORAL_TABLET | Freq: Every day | ORAL | Status: DC
  Administered 2015-07-17 – 2015-07-18 (×2): 10 mg via ORAL
  Filled 2015-07-17 (×3): qty 1

## 2015-07-17 MED ORDER — REGADENOSON 0.4 MG/5ML IV SOLN
0.4000 mg | Freq: Once | INTRAVENOUS | Status: AC
  Administered 2015-07-17: 0.4 mg via INTRAVENOUS
  Filled 2015-07-17: qty 5
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
## 2015-07-17 NOTE — Care Management Note (Signed)
Case Management Note  Patient Details  Name: JAMARIYA GASTINEAU MRN: [id number] Date of Birth: 1951-04-22  Subjective/Objective:  Pt admitted for chest pain. Pt is from home alone and has [facility] in the home 5 days a wk. Tues, Thurs 3 hours per day and M, W, F 3 hrs 15 minutes. Pt has DME WC, RW and cane.                  Action/Plan: Per pt she has transportation to MD appointments. CM will continue to monitor for additional needs.    Expected Discharge Date:                  Expected Discharge Plan:  Home/Self Care  In-House Referral:  NA  Discharge planning Services  CM Consult  Post Acute Care Choice:  NA Choice offered to:  NA  DME Arranged:  N/A DME Agency:  NA  HH Arranged:  NA HH Agency:  NA  Status of Service:  Completed, signed off  Medicare Important Message Given:    Date Medicare IM Given:    Medicare IM give by:    Date Additional Medicare IM Given:    Additional Medicare Important Message give by:     If discussed at [place] of Stay Meetings, dates discussed:    Additional Comments:  Bethena Roys, RN 07/17/2015, 12:10 PM
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
## 2015-07-17 NOTE — Care Management Obs Status (Signed)
[facility] NOTIFICATION   Patient Details  Name: Kelly [name] MRN: [id number] Date of Birth: 1950/12/13   Medicare Observation Status Notification Given:  Yes    Bethena Roys, RN 07/17/2015, 12:09 PM

## 2015-07-17 NOTE — Progress Notes (Signed)
Patient admitted after midnight. Chart reviewed. Patient examined. Reproducible CP. Will give a dose of toradol. For 2 day nuclear stress test.  Doree Barthel, MD [facility] [email or web address]

## 2015-07-18 ENCOUNTER — Observation Stay (HOSPITAL_BASED_OUTPATIENT_CLINIC_OR_DEPARTMENT_OTHER): Payer: Medicare Other

## 2015-07-18 DIAGNOSIS — E785 Hyperlipidemia, unspecified: Secondary | ICD-10-CM | POA: Diagnosis not present

## 2015-07-18 DIAGNOSIS — E039 Hypothyroidism, unspecified: Secondary | ICD-10-CM | POA: Diagnosis not present

## 2015-07-18 DIAGNOSIS — R079 Chest pain, unspecified: Secondary | ICD-10-CM | POA: Diagnosis not present

## 2015-07-18 LAB — NM MYOCAR MULTI W/SPECT W/WALL MOTION / EF
CHL CUP MPHR: 154 {beats}/min
CHL CUP RESTING HR STRESS: 82 {beats}/min
CSEPEW: 1 METS
CSEPHR: 74 %
CSEPPHR: 114 {beats}/min
Exercise duration (min): 5 min
LHR: 0
LVDIAVOL: 66 mL
LVSYSVOL: 32 mL
NUC STRESS TID: 1.01
SDS: 4
SRS: 8
SSS: 12

## 2015-07-18 MED ORDER — IBUPROFEN 200 MG PO CAPS: 400.0000 mg | ORAL_CAPSULE | Freq: Four times a day (QID) | ORAL | Status: DC | PRN

## 2015-07-18 MED ORDER — PERFLUTREN LIPID MICROSPHERE
1.0000 mL | INTRAVENOUS | Status: AC | PRN
  Administered 2015-07-18: 4 mL via INTRAVENOUS
  Filled 2015-07-18: qty 10

## 2015-07-18 MED ORDER — TECHNETIUM TC 99M SESTAMIBI GENERIC - CARDIOLITE
30.0000 | Freq: Once | INTRAVENOUS | Status: AC | PRN
  Administered 2015-07-18: 30 via INTRAVENOUS

## 2015-07-18 MED ORDER — KETOROLAC TROMETHAMINE 30 MG/ML IJ SOLN
30.0000 mg | Freq: Once | INTRAMUSCULAR | Status: AC
  Administered 2015-07-18: 30 mg via INTRAVENOUS
  Filled 2015-07-18: qty 1

## 2015-07-18 NOTE — Progress Notes (Signed)
Discharge teaching and instructions reviewed. No further questions. VSS. Pt discharging home via daughter.

## 2015-07-18 NOTE — Progress Notes (Signed)
[facility] PROGRESS NOTE  TASYA JOBIN [id number] DOB: [birth year] DOA: 07/16/2015 PCP: Garnet Koyanagi, DO  Assessment/Plan:  Principal Problem:   Chest pain: 2 day nuclear stress test pending. Echo ok. Seems musculoskeletal. Improved with toradol. Will give another dose per patient request. Active Problems:   Hyperlipidemia   Essential hypertension   Hypothyroidism obesity  Code Status:  full Family Communication:   Disposition Plan:  home  Consultants:    Procedures:     Antibiotics:    HPI/Subjective: CP yesterday improved with toradol. Has returned, but less intense today. Requesting another dose.  Objective: Filed Vitals:   07/18/15 0440 07/18/15 1200  BP: 121/64 139/80  Pulse: 74   Temp: 97.5 F (36.4 C)   Resp: 16     Intake/Output Summary (Last 24 hours) at 07/18/15 1321 Last data filed at 07/18/15 0800  Gross per 24 hour  Intake    960 ml  Output   1300 ml  Net   -340 ml   Filed Weights   07/16/15 2333 07/17/15 0158 07/18/15 0440  Weight: 127.007 kg (280 lb) 129.048 kg (284 lb 8 oz) 127.4 kg (280 lb 13.9 oz)    Exam:   General:  Comfortable. A and o  Cardiovascular: RRR without MGR. Slight TTP left chest wall  Respiratory: CTA without WRr  Abdomen: obese, soft, nt  Ext: no CCE  Basic Metabolic Panel:  Recent Labs Lab 07/16/15 2320  NA 144  K 3.7  CL 104  CO2 29  GLUCOSE 133*  BUN 14  CREATININE 0.61  CALCIUM 9.2   Liver Function Tests: No results for input(s): AST, ALT, ALKPHOS, BILITOT, PROT, ALBUMIN in the last 168 hours. No results for input(s): LIPASE, AMYLASE in the last 168 hours. No results for input(s): AMMONIA in the last 168 hours. CBC:  Recent Labs Lab 07/16/15 2320  WBC 8.2  HGB 11.6*  HCT 36.6  MCV 88.0  PLT 190   Cardiac Enzymes:  Recent Labs Lab 07/17/15 0401 07/17/15 1043 07/17/15 1458  TROPONINI <0.03 <0.03 <0.03   BNP (last 3 results) No results for input(s): BNP in the  last 8760 hours.  ProBNP (last 3 results) No results for input(s): PROBNP in the last 8760 hours.  CBG: No results for input(s): GLUCAP in the last 168 hours.  No results found for this or any previous visit (from the past 240 hour(s)).   Studies: Dg Chest 2 View  07/17/2015  CLINICAL DATA:  Chest pain tonight EXAM: CHEST  2 VIEW COMPARISON:  03/10/2015 FINDINGS: Normal heart size and mediastinal contours. No acute infiltrate or edema. No effusion or pneumothorax. Chronic posttraumatic deformity of the left chest wall. Bulky spurs from spondylosis. IMPRESSION: No acute finding. Electronically Signed   By: Monte Fantasia M.D.   On: 07/17/2015 01:12   Echo Left ventricle: The cavity size was normal. Wall thickness was normal. Systolic function was normal. The estimated ejection fraction was in the range of 55% to 60%. Wall motion was normal; there were no regional wall motion abnormalities. Doppler parameters are consistent with abnormal left ventricular relaxation (grade 1 diastolic dysfunction).  Impressions:  - Technically difficult; definity used; normal LV systolic function; grade 1 diastolic dysfunction.  Scheduled Meds: . aspirin EC  325 mg Oral Daily  . atorvastatin  20 mg Oral Daily  . benazepril  10 mg Oral Daily  . cholecalciferol  4,000 Units Oral Daily  . enoxaparin (LOVENOX) injection  40 mg Subcutaneous Q24H  . fluticasone  2 spray Each Nare Daily  . furosemide  40 mg Oral Daily  . gabapentin  100 mg Oral TID  . levothyroxine  75 mcg Oral QAC breakfast  . loratadine  10 mg Oral Daily  . multivitamin with minerals  1 tablet Oral Daily  . omega-3 acid ethyl esters  1 g Oral Daily  . potassium chloride SA  20 mEq Oral Daily   Continuous Infusions:   Time spent: 35 minutes  [facility] Pager [phone]. If 7PM-7AM, please contact night-coverage at [email or web address], password [email or web address] 07/18/2015, 1:21 PM

## 2015-07-18 NOTE — Plan of Care (Signed)
Problem: Phase I Progression Outcomes Goal: MD aware of Cardiac Marker results Outcome: Completed/Met Date Met:  07/18/15 Cardiac markers negative X3. MD aware.

## 2015-07-18 NOTE — Discharge Summary (Signed)
Physician Discharge Summary  Kelly [name] [id number] DOB: 24-Sep-1950 DOA: 07/16/2015  PCP: Garnet Koyanagi, DO  Admit date: 07/16/2015 Discharge date: 07/18/2015   Discharge Diagnoses:  Principal Problem:   Chest pain, musculoskeletal Active Problems:   Hyperlipidemia   Essential hypertension   Hypothyroidism Morbid obesity   Discharge Condition: stable  Diet recommendation: heart healthy  Filed Weights   07/16/15 2333 07/17/15 0158 07/18/15 0440  Weight: 127.007 kg (280 lb) 129.048 kg (284 lb 8 oz) 127.4 kg (280 lb 13.9 oz)    History of present illness:  65 y.o. female with history of hypertension, hyperlipidemia and hypothyroidism started experiencing chest pain last night. Patient is Kelly [name] was retrosternal radiating to left arm. Denies any associated productive cough fever chills shortness of breath. EMS was called and patient was given sublingual nitroglycerin following which patient chest pain improved and are present second dose of sublingual nitroglycerin in the ER chest pain resolved. EKG and cardiac markers were unremarkable chest x-ray did not show anything acute. Patient has been admitted for further management of chest pain. Patient had one episode of nausea and vomiting but denies any abdominal pain.    Hospital Course:  Observed on telemetry. MI ruled out. Cardiology consulted. 2 day nuclear perfusion study done and showed no ischemia. Echocardiogram showed normal ejection fraction. On physical examination, patient did have reproducible component to the chest pain and it improved with Toradol. She may take ibuprofen as needed at discharge.  Procedures:  none  Consultations:  cardiology  Discharge Exam: Filed Vitals:   07/18/15 0440 07/18/15 1200  BP: 121/64 139/80  Pulse: 74   Temp: 97.5 F (36.4 C)   Resp: 16    See progress note  Discharge Instructions   Discharge Instructions    Activity as tolerated - No restrictions    Complete by:  As  directed      Diet - low sodium heart healthy    Complete by:  As directed           Current Discharge Medication List    START taking these medications   Details  Ibuprofen 200 MG CAPS Take 2 capsules (400 mg total) by mouth every 6 (six) hours as needed (pain). Qty: 120 each, Refills: 0      CONTINUE these medications which have NOT CHANGED   Details  aspirin 81 MG EC tablet Take 81 mg by mouth daily.      atorvastatin (LIPITOR) 20 MG tablet Take 1 tablet (20 mg total) by mouth daily. Qty: 90 tablet, Refills: 3   Associated Diagnoses: Hyperlipidemia    benazepril (LOTENSIN) 10 MG tablet take 1 tablet by mouth once daily Qty: 90 tablet, Refills: 3   Associated Diagnoses: Essential hypertension    Cholecalciferol (VITAMIN D3) 2000 UNITS capsule Take 4,000 Units by mouth daily.     fluticasone (FLONASE) 50 MCG/ACT nasal spray Place 2 sprays into both nostrils daily. Qty: 16 g, Refills: 6   Associated Diagnoses: Seasonal allergies    furosemide (LASIX) 40 MG tablet Take 1 tablet (40 mg total) by mouth daily. Qty: 90 tablet, Refills: 3   Associated Diagnoses: Essential hypertension    gabapentin (NEURONTIN) 100 MG capsule take 1 capsule by mouth three times a day Qty: 90 capsule, Refills: 3    HYDROcodone-acetaminophen (NORCO/VICODIN) 5-325 MG tablet Take 1 tablet by mouth every 6 (six) hours as needed for moderate pain. Qty: 60 tablet, Refills: 0   Associated Diagnoses: Left foot pain; Right-sided low back pain  without sciatica    loratadine (CLARITIN) 10 MG tablet Take 1 tablet (10 mg total) by mouth daily. Qty: 30 tablet, Refills: 11   Associated Diagnoses: Seasonal allergies    Multiple Vitamin (MULTIVITAMIN) tablet Take 1 tablet by mouth daily.      Omega-3 Fatty Acids (FISH OIL PO) Take 2 tablets by mouth daily.     potassium chloride SA (K-DUR,KLOR-CON) 20 MEQ tablet Take 1 tablet (20 mEq total) by mouth daily. Qty: 90 tablet, Refills: 3   Associated  Diagnoses: Essential hypertension    Propylene Glycol (SYSTANE BALANCE OP) Apply 1 drop to eye daily. 1 DROP IN EACH EYE DAILY    SYNTHROID 75 MCG tablet take 1 tablet by mouth once daily BEFORE BREAKFAST Qty: 90 tablet, Refills: 1       Allergies  Allergen Reactions  . Aspirin     REACTION: GI UPSET CAN TAKE LOW DOSE ASPIRIN  . Codeine     REACTION: NAUSEA  . Flexeril [Cyclobenzaprine] Other (See Comments)    About made me crazy  . Naproxen Nausea And Vomiting  . Percocet [Oxycodone-Acetaminophen] Nausea And Vomiting  . Tape Rash   Follow-up Information    Follow up with Garnet Koyanagi, DO.   Specialty:  Family Medicine   Why:  As needed   Contact information:   [address] [place] [address] [phone]        The results of significant diagnostics from this hospitalization (including imaging, microbiology, ancillary and laboratory) are listed below for reference.    Significant Diagnostic Studies: Dg Chest 2 View  07/17/2015  CLINICAL DATA:  Chest pain tonight EXAM: CHEST  2 VIEW COMPARISON:  03/10/2015 FINDINGS: Normal heart size and mediastinal contours. No acute infiltrate or edema. No effusion or pneumothorax. Chronic posttraumatic deformity of the left chest wall. Bulky spurs from spondylosis. IMPRESSION: No acute finding. Electronically Signed   By: Monte Fantasia M.D.   On: 07/17/2015 01:12   Nm Myocar Multi W/spect W/wall Motion / Ef  07/18/2015   There was no ST segment deviation noted during stress.  Defect 1: There is a medium defect of mild severity present in the basal anteroseptal, basal inferoseptal, mid anteroseptal and mid inferoseptal location.  This is a low risk study.  Nuclear stress EF: 52%.  Poor quality study due to obesity. Study sensitivity and accuracy are limited. Questionable mid septal fixed defect, suspect artifact. No large reversible ischemic defects are seen. Borderline calculated LVEF by QGS, likely inaccurate due to  image quality. Visually estimated LV systolic function in normal range.   Echo Left ventricle: The cavity size was normal. Wall thickness was normal. Systolic function was normal. The estimated ejection fraction was in the range of 55% to 60%. Wall motion was normal; there were no regional wall motion abnormalities. Doppler parameters are consistent with abnormal left ventricular relaxation (grade 1 diastolic dysfunction).  Impressions:  - Technically difficult; definity used; normal LV systolic function; grade 1 diastolic dysfunction.  Microbiology: No results found for this or any previous visit (from the past 240 hour(s)).   Labs: Basic Metabolic Panel:  Recent Labs Lab 07/16/15 2320  NA 144  K 3.7  CL 104  CO2 29  GLUCOSE 133*  BUN 14  CREATININE 0.61  CALCIUM 9.2   Liver Function Tests: No results for input(s): AST, ALT, ALKPHOS, BILITOT, PROT, ALBUMIN in the last 168 hours. No results for input(s): LIPASE, AMYLASE in the last 168 hours. No results  for input(s): AMMONIA in the last 168 hours. CBC:  Recent Labs Lab 07/16/15 2320  WBC 8.2  HGB 11.6*  HCT 36.6  MCV 88.0  PLT 190   Cardiac Enzymes:  Recent Labs Lab 07/17/15 0401 07/17/15 1043 07/17/15 1458  TROPONINI <0.03 <0.03 <0.03   BNP: BNP (last 3 results) No results for input(s): BNP in the last 8760 hours.  ProBNP (last 3 results) No results for input(s): PROBNP in the last 8760 hours.  CBG: No results for input(s): GLUCAP in the last 168 hours.     Signed:  Delfina Redwood MD [facility] 07/18/2015, 3:54 PM

## 2015-07-18 NOTE — Plan of Care (Signed)
Problem: Phase II Progression Outcomes Goal: Anginal pain relieved Outcome: Progressing Pt had one episode of CP throughout the night which resolved with vicodin around 11pm. No complaints since, CP free this am  Pt down for echo, upon return will go to nuc med for 2nd day of stress test.

## 2015-07-18 NOTE — Progress Notes (Addendum)
Patient Name: Kelly [name] Date of Encounter: 07/18/2015   SUBJECTIVE  Feeling well. No chest pain, sob or palpitations.   CURRENT MEDS . aspirin EC  325 mg Oral Daily  . atorvastatin  20 mg Oral Daily  . benazepril  10 mg Oral Daily  . cholecalciferol  4,000 Units Oral Daily  . enoxaparin (LOVENOX) injection  40 mg Subcutaneous Q24H  . fluticasone  2 spray Each Nare Daily  . furosemide  40 mg Oral Daily  . gabapentin  100 mg Oral TID  . levothyroxine  75 mcg Oral QAC breakfast  . loratadine  10 mg Oral Daily  . multivitamin with minerals  1 tablet Oral Daily  . omega-3 acid ethyl esters  1 g Oral Daily  . potassium chloride SA  20 mEq Oral Daily    OBJECTIVE  Filed Vitals:   07/17/15 2000 07/17/15 2328 07/18/15 0440 07/18/15 1200  BP: 134/60 129/78 121/64 139/80  Pulse: 72 80 74   Temp: 98.3 F (36.8 C) 98.4 F (36.9 C) 97.5 F (36.4 C)   TempSrc: Oral Oral Oral   Resp: 20 19 16    Height:      Weight:   280 lb 13.9 oz (127.4 kg)   SpO2: 93% 94% 96%     Intake/Output Summary (Last 24 hours) at 07/18/15 1251 Last data filed at 07/18/15 0800  Gross per 24 hour  Intake    960 ml  Output   1300 ml  Net   -340 ml   Filed Weights   07/16/15 2333 07/17/15 0158 07/18/15 0440  Weight: 280 lb (127.007 kg) 284 lb 8 oz (129.048 kg) 280 lb 13.9 oz (127.4 kg)    PHYSICAL EXAM  General: Pleasant, NAD. Neuro: Alert and oriented X 3. Moves all extremities spontaneously. Psych: Normal affect. HEENT:  Normal  Neck: Supple without bruits or JVD. Lungs:  Resp regular and unlabored, CTA. Heart: RRR no s3, s4, or murmurs. Abdomen: Soft, non-tender, non-distended, BS + x 4.  Extremities: No clubbing, cyanosis or edema. DP/PT/Radials 2+ and equal bilaterally.  Accessory Clinical Findings  CBC  Recent Labs  07/16/15 2320  WBC 8.2  HGB 11.6*  HCT 36.6  MCV 88.0  PLT 99991111   Basic Metabolic Panel  Recent Labs  07/16/15 2320  NA 144  K 3.7  CL 104  CO2 29    GLUCOSE 133*  BUN 14  CREATININE 0.61  CALCIUM 9.2   Liver Function Tests No results for input(s): AST, ALT, ALKPHOS, BILITOT, PROT, ALBUMIN in the last 72 hours. No results for input(s): LIPASE, AMYLASE in the last 72 hours. Cardiac Enzymes  Recent Labs  07/17/15 0401 07/17/15 1043 07/17/15 1458  TROPONINI <0.03 <0.03 <0.03   BNP Invalid input(s): POCBNP D-Dimer  Recent Labs  07/17/15 0401  DDIMER 0.50    TELE  Sinus rhythm   Radiology/Studies  Dg Chest 2 View  07/17/2015  CLINICAL DATA:  Chest pain tonight EXAM: CHEST  2 VIEW COMPARISON:  03/10/2015 FINDINGS: Normal heart size and mediastinal contours. No acute infiltrate or edema. No effusion or pneumothorax. Chronic posttraumatic deformity of the left chest wall. Bulky spurs from spondylosis. IMPRESSION: No acute finding. Electronically Signed   By: Monte Fantasia M.D.   On: 07/17/2015 01:12    ASSESSMENT AND PLAN   1. Chest pain - resolved. Trop negative. Pending echo and nuc. Given dose of toradol.   2. Hypertension - Increased Benazepril  To 20mg .  BP improved.  3. HL -01/25/2015: Cholesterol 125; HDL 33.40*; LDL Cholesterol 67; Triglycerides 122.0; VLDL 24.4  - Continue statin.  4. Hyperglycemia - A1c of 5.2 07/24/2013.   5. Hypothyroidism  6. Morbid obesity - Estimated body mass index is 53.78 kg/(m^2) as calculated from the following:  Height as of this encounter: 5\' 1"  (1.549 m).  Weight as of this encounter: 284 lb 8 oz (129.048 kg).   -advised lifestyle modifications.   7. Hx of PE - unable to find records. D-dimer 0.5. No recent travel of surgery. Further management per primary if needed.     Jarrett Soho PA-C Pager [phone]  Patient seen and examined. Agree with assessment and plan. BP improved on increased benazapril.  Echo EF 55-60% with grade 1 DD. 2 day protocol nuclear study with probable attenuation artifact; low risk without ischemia;  EF 52%. Chest pain  improved with toradol; suspect musculoskeletal in etiology. OK to dc from cardiac standpoint.   Troy Sine, MD, [facility] 07/18/2015 3:25 PM

## 2015-07-18 NOTE — Progress Notes (Signed)
  Echocardiogram 2D Echocardiogram with Definity has been performed.  Jennette Dubin 07/18/2015, 11:51 AM

## 2015-07-29 ENCOUNTER — Ambulatory Visit: Payer: Medicare Other | Admitting: Family Medicine

## 2015-08-02 ENCOUNTER — Ambulatory Visit (INDEPENDENT_AMBULATORY_CARE_PROVIDER_SITE_OTHER): Payer: Medicare Other | Admitting: Family Medicine

## 2015-08-02 ENCOUNTER — Ambulatory Visit: Payer: Medicare Other

## 2015-08-02 ENCOUNTER — Encounter: Payer: Self-pay | Admitting: Family Medicine

## 2015-08-02 VITALS — BP 134/84 | HR 82 | Temp 97.8°F | Ht 61.0 in | Wt 281.2 lb

## 2015-08-02 DIAGNOSIS — I10 Essential (primary) hypertension: Secondary | ICD-10-CM

## 2015-08-02 DIAGNOSIS — E785 Hyperlipidemia, unspecified: Secondary | ICD-10-CM | POA: Diagnosis not present

## 2015-08-02 DIAGNOSIS — Z0000 Encounter for general adult medical examination without abnormal findings: Secondary | ICD-10-CM | POA: Diagnosis not present

## 2015-08-02 DIAGNOSIS — R739 Hyperglycemia, unspecified: Secondary | ICD-10-CM | POA: Diagnosis not present

## 2015-08-02 DIAGNOSIS — R7989 Other specified abnormal findings of blood chemistry: Secondary | ICD-10-CM | POA: Diagnosis not present

## 2015-08-02 DIAGNOSIS — M792 Neuralgia and neuritis, unspecified: Secondary | ICD-10-CM

## 2015-08-02 DIAGNOSIS — R319 Hematuria, unspecified: Secondary | ICD-10-CM | POA: Diagnosis not present

## 2015-08-02 DIAGNOSIS — Z1159 Encounter for screening for other viral diseases: Secondary | ICD-10-CM

## 2015-08-02 DIAGNOSIS — G8194 Hemiplegia, unspecified affecting left nondominant side: Secondary | ICD-10-CM

## 2015-08-02 DIAGNOSIS — E039 Hypothyroidism, unspecified: Secondary | ICD-10-CM

## 2015-08-02 DIAGNOSIS — Z823 Family history of stroke: Secondary | ICD-10-CM

## 2015-08-02 LAB — CBC WITH DIFFERENTIAL/PLATELET
BASOS PCT: 0.3 % (ref 0.0–3.0)
Basophils Absolute: 0 10*3/uL (ref 0.0–0.1)
EOS ABS: 0.1 10*3/uL (ref 0.0–0.7)
EOS PCT: 1.4 % (ref 0.0–5.0)
HEMATOCRIT: 35.6 % — AB (ref 36.0–46.0)
HEMOGLOBIN: 11.6 g/dL — AB (ref 12.0–15.0)
LYMPHS PCT: 23.4 % (ref 12.0–46.0)
Lymphs Abs: 2 10*3/uL (ref 0.7–4.0)
MCHC: 32.6 g/dL (ref 30.0–36.0)
MCV: 84.8 fl (ref 78.0–100.0)
MONOS PCT: 4.7 % (ref 3.0–12.0)
Monocytes Absolute: 0.4 10*3/uL (ref 0.1–1.0)
NEUTROS ABS: 6 10*3/uL (ref 1.4–7.7)
Neutrophils Relative %: 70.2 % (ref 43.0–77.0)
PLATELETS: 222 10*3/uL (ref 150.0–400.0)
RBC: 4.2 Mil/uL (ref 3.87–5.11)
RDW: 17.5 % — AB (ref 11.5–15.5)
WBC: 8.5 10*3/uL (ref 4.0–10.5)

## 2015-08-02 LAB — POCT URINALYSIS DIPSTICK
BILIRUBIN UA: NEGATIVE
Glucose, UA: NEGATIVE
Ketones, UA: NEGATIVE
LEUKOCYTES UA: NEGATIVE
NITRITE UA: NEGATIVE
PH UA: 6
PROTEIN UA: NEGATIVE
Spec Grav, UA: >=1.03
UROBILINOGEN UA: 0.2

## 2015-08-02 LAB — COMPREHENSIVE METABOLIC PANEL
ALBUMIN: 4 g/dL (ref 3.5–5.2)
ALT: 18 U/L (ref 0–35)
AST: 16 U/L (ref 0–37)
Alkaline Phosphatase: 128 U/L — ABNORMAL HIGH (ref 39–117)
BILIRUBIN TOTAL: 1 mg/dL (ref 0.2–1.2)
BUN: 11 mg/dL (ref 6–23)
CALCIUM: 9.2 mg/dL (ref 8.4–10.5)
CHLORIDE: 103 meq/L (ref 96–112)
CO2: 29 meq/L (ref 19–32)
CREATININE: 0.47 mg/dL (ref 0.40–1.20)
GFR: 140.74 mL/min (ref >60.00)
Glucose, Bld: 101 mg/dL — ABNORMAL HIGH (ref 70–99)
Potassium: 3.9 mEq/L (ref 3.5–5.1)
Sodium: 142 mEq/L (ref 135–145)
Total Protein: 7.3 g/dL (ref 6.0–8.3)

## 2015-08-02 LAB — HEMOGLOBIN A1C: Hgb A1c MFr Bld: 5.3 % (ref 4.6–6.5)

## 2015-08-02 LAB — LIPID PANEL
CHOLESTEROL: 125 mg/dL (ref 0–200)
HDL: 34.9 mg/dL — AB (ref >39.00)
LDL Cholesterol: 71 mg/dL (ref 0–99)
NonHDL: 90.06
TRIGLYCERIDES: 95 mg/dL (ref 0.0–149.0)
Total CHOL/HDL Ratio: 4
VLDL: 19 mg/dL (ref 0.0–40.0)

## 2015-08-02 LAB — TSH: TSH: 4.02 u[IU]/mL (ref 0.35–4.50)

## 2015-08-02 MED ORDER — GABAPENTIN 100 MG PO CAPS: 100.0000 mg | ORAL_CAPSULE | Freq: Two times a day (BID) | ORAL | Status: DC

## 2015-08-02 MED ORDER — NONFORMULARY OR COMPOUNDED ITEM: Status: DC
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
## 2015-08-02 NOTE — Patient Instructions (Signed)
Preventive Care for Adults, Female A healthy lifestyle and preventive care can promote health and wellness. Preventive health guidelines for women include the following key practices.  A routine yearly physical is a good way to check with your health care provider about your health and preventive screening. It is a chance to share any concerns and updates on your health and to receive a thorough exam.  Visit your dentist for a routine exam and preventive care every 6 months. Brush your teeth twice a day and floss once a day. Good oral hygiene prevents tooth decay and gum disease.  The frequency of eye exams is based on your age, health, family medical history, use of contact lenses, and other factors. Follow your health care provider's recommendations for frequency of eye exams.  Eat a healthy diet. Foods like vegetables, fruits, whole grains, low-fat dairy products, and lean protein foods contain the nutrients you need without too many calories. Decrease your intake of foods high in solid fats, added sugars, and salt. Eat the right amount of calories for you.Get information about a proper diet from your health care provider, if necessary.  Regular physical exercise is one of the most important things you can do for your health. Most adults should get at least 150 minutes of moderate-intensity exercise (any activity that increases your heart rate and causes you to sweat) each week. In addition, most adults need muscle-strengthening exercises on 2 or more days a week.  Maintain a healthy weight. The body mass index (BMI) is a screening tool to identify possible weight problems. It provides an estimate of body fat based on height and weight. Your health care provider can find your BMI and can help you achieve or maintain a healthy weight.For adults 20 years and older:  A BMI below 18.5 is considered underweight.  A BMI of 18.5 to 24.9 is normal.  A BMI of 25 to 29.9 is considered overweight.  A  BMI of 30 and above is considered obese.  Maintain normal blood lipids and cholesterol levels by exercising and minimizing your intake of saturated fat. Eat a balanced diet with plenty of fruit and vegetables. Blood tests for lipids and cholesterol should begin at age 45 and be repeated every 5 years. If your lipid or cholesterol levels are high, you are over 50, or you are at high risk for heart disease, you may need your cholesterol levels checked more frequently.Ongoing high lipid and cholesterol levels should be treated with medicines if diet and exercise are not working.  If you smoke, find out from your health care provider how to quit. If you do not use tobacco, do not start.  Lung cancer screening is recommended for adults aged 45-80 years who are at high risk for developing lung cancer because of a history of smoking. A yearly low-dose CT scan of the lungs is recommended for people who have at least a 30-pack-year history of smoking and are a current smoker or have quit within the past 15 years. A pack year of smoking is smoking an average of 1 pack of cigarettes a day for 1 year (for example: 1 pack a day for 30 years or 2 packs a day for 15 years). Yearly screening should continue until the smoker has stopped smoking for at least 15 years. Yearly screening should be stopped for people who develop a health problem that would prevent them from having lung cancer treatment.  If you are pregnant, do not drink alcohol. If you are  breastfeeding, be very cautious about drinking alcohol. If you are not pregnant and choose to drink alcohol, do not have more than 1 drink per day. One drink is considered to be 12 ounces (355 mL) of beer, 5 ounces (148 mL) of wine, or 1.5 ounces (44 mL) of liquor.  Avoid use of street drugs. Do not share needles with anyone. Ask for help if you need support or instructions about stopping the use of drugs.  High blood pressure causes heart disease and increases the risk  of stroke. Your blood pressure should be checked at least every 1 to 2 years. Ongoing high blood pressure should be treated with medicines if weight loss and exercise do not work.  If you are 55-79 years old, ask your health care provider if you should take aspirin to prevent strokes.  Diabetes screening is done by taking a blood sample to check your blood glucose level after you have not eaten for a certain period of time (fasting). If you are not overweight and you do not have risk factors for diabetes, you should be screened once every 3 years starting at age 45. If you are overweight or obese and you are 40-70 years of age, you should be screened for diabetes every year as part of your cardiovascular risk assessment.  Breast cancer screening is essential preventive care for women. You should practice "breast self-awareness." This means understanding the normal appearance and feel of your breasts and may include breast self-examination. Any changes detected, no matter how small, should be reported to a health care provider. Women in their 20s and 30s should have a clinical breast exam (CBE) by a health care provider as part of a regular health exam every 1 to 3 years. After age 40, women should have a CBE every year. Starting at age 40, women should consider having a mammogram (breast X-ray test) every year. Women who have a family history of breast cancer should talk to their health care provider about genetic screening. Women at a high risk of breast cancer should talk to their health care providers about having an MRI and a mammogram every year.  Breast cancer gene (BRCA)-related cancer risk assessment is recommended for women who have family members with BRCA-related cancers. BRCA-related cancers include breast, ovarian, tubal, and peritoneal cancers. Having family members with these cancers may be associated with an increased risk for harmful changes (mutations) in the breast cancer genes BRCA1 and  BRCA2. Results of the assessment will determine the need for genetic counseling and BRCA1 and BRCA2 testing.  Your health care provider may recommend that you be screened regularly for cancer of the pelvic organs (ovaries, uterus, and vagina). This screening involves a pelvic examination, including checking for microscopic changes to the surface of your cervix (Pap test). You may be encouraged to have this screening done every 3 years, beginning at age 21.  For women ages 30-65, health care providers may recommend pelvic exams and Pap testing every 3 years, or they may recommend the Pap and pelvic exam, combined with testing for human papilloma virus (HPV), every 5 years. Some types of HPV increase your risk of cervical cancer. Testing for HPV may also be done on women of any age with unclear Pap test results.  Other health care providers may not recommend any screening for nonpregnant women who are considered low risk for pelvic cancer and who do not have symptoms. Ask your health care provider if a screening pelvic exam is right for   you.  If you have had past treatment for cervical cancer or a condition that could lead to cancer, you need Pap tests and screening for cancer for at least 20 years after your treatment. If Pap tests have been discontinued, your risk factors (such as having a new sexual partner) need to be reassessed to determine if screening should resume. Some women have medical problems that increase the chance of getting cervical cancer. In these cases, your health care provider may recommend more frequent screening and Pap tests.  Colorectal cancer can be detected and often prevented. Most routine colorectal cancer screening begins at the age of 50 years and continues through age 75 years. However, your health care provider may recommend screening at an earlier age if you have risk factors for colon cancer. On a yearly basis, your health care provider may provide home test kits to check  for hidden blood in the stool. Use of a small camera at the end of a tube, to directly examine the colon (sigmoidoscopy or colonoscopy), can detect the earliest forms of colorectal cancer. Talk to your health care provider about this at age 50, when routine screening begins. Direct exam of the colon should be repeated every 5-10 years through age 75 years, unless early forms of precancerous polyps or small growths are found.  People who are at an increased risk for hepatitis B should be screened for this virus. You are considered at high risk for hepatitis B if:  You were born in a country where hepatitis B occurs often. Talk with your health care provider about which countries are considered high risk.  Your parents were born in a high-risk country and you have not received a shot to protect against hepatitis B (hepatitis B vaccine).  You have HIV or AIDS.  You use needles to inject street drugs.  You live with, or have sex with, someone who has hepatitis B.  You get hemodialysis treatment.  You take certain medicines for conditions like cancer, organ transplantation, and autoimmune conditions.  Hepatitis C blood testing is recommended for all people born from 1945 through 1965 and any individual with known risks for hepatitis C.  Practice safe sex. Use condoms and avoid high-risk sexual practices to reduce the spread of sexually transmitted infections (STIs). STIs include gonorrhea, chlamydia, syphilis, trichomonas, herpes, HPV, and human immunodeficiency virus (HIV). Herpes, HIV, and HPV are viral illnesses that have no cure. They can result in disability, cancer, and death.  You should be screened for sexually transmitted illnesses (STIs) including gonorrhea and chlamydia if:  You are sexually active and are younger than 24 years.  You are older than 24 years and your health care provider tells you that you are at risk for this type of infection.  Your sexual activity has changed  since you were last screened and you are at an increased risk for chlamydia or gonorrhea. Ask your health care provider if you are at risk.  If you are at risk of being infected with HIV, it is recommended that you take a prescription medicine daily to prevent HIV infection. This is called preexposure prophylaxis (PrEP). You are considered at risk if:  You are sexually active and do not regularly use condoms or know the HIV status of your partner(s).  You take drugs by injection.  You are sexually active with a partner who has HIV.  Talk with your health care provider about whether you are at high risk of being infected with HIV. If   you choose to begin PrEP, you should first be tested for HIV. You should then be tested every 3 months for as long as you are taking PrEP.  Osteoporosis is a disease in which the bones lose minerals and strength with aging. This can result in serious bone fractures or breaks. The risk of osteoporosis can be identified using a bone density scan. Women ages 67 years and over and women at risk for fractures or osteoporosis should discuss screening with their health care providers. Ask your health care provider whether you should take a calcium supplement or vitamin D to reduce the rate of osteoporosis.  Menopause can be associated with physical symptoms and risks. Hormone replacement therapy is available to decrease symptoms and risks. You should talk to your health care provider about whether hormone replacement therapy is right for you.  Use sunscreen. Apply sunscreen liberally and repeatedly throughout the day. You should seek shade when your shadow is shorter than you. Protect yourself by wearing long sleeves, pants, a wide-brimmed hat, and sunglasses year round, whenever you are outdoors.  Once a month, do a whole body skin exam, using a mirror to look at the skin on your back. Tell your health care provider of new moles, moles that have irregular borders, moles that  are larger than a pencil eraser, or moles that have changed in shape or color.  Stay current with required vaccines (immunizations).  Influenza vaccine. All adults should be immunized every year.  Tetanus, diphtheria, and acellular pertussis (Td, Tdap) vaccine. Pregnant women should receive 1 dose of Tdap vaccine during each pregnancy. The dose should be obtained regardless of the length of time since the last dose. Immunization is preferred during the 27th-36th week of gestation. An adult who has not previously received Tdap or who does not know her vaccine status should receive 1 dose of Tdap. This initial dose should be followed by tetanus and diphtheria toxoids (Td) booster doses every 10 years. Adults with an unknown or incomplete history of completing a 3-dose immunization series with Td-containing vaccines should begin or complete a primary immunization series including a Tdap dose. Adults should receive a Td booster every 10 years.  Varicella vaccine. An adult without evidence of immunity to varicella should receive 2 doses or a second dose if she has previously received 1 dose. Pregnant females who do not have evidence of immunity should receive the first dose after pregnancy. This first dose should be obtained before leaving the health care facility. The second dose should be obtained 4-8 weeks after the first dose.  Human papillomavirus (HPV) vaccine. Females aged 13-26 years who have not received the vaccine previously should obtain the 3-dose series. The vaccine is not recommended for use in pregnant females. However, pregnancy testing is not needed before receiving a dose. If a female is found to be pregnant after receiving a dose, no treatment is needed. In that case, the remaining doses should be delayed until after the pregnancy. Immunization is recommended for any person with an immunocompromised condition through the age of 61 years if she did not get any or all doses earlier. During the  3-dose series, the second dose should be obtained 4-8 weeks after the first dose. The third dose should be obtained 24 weeks after the first dose and 16 weeks after the second dose.  Zoster vaccine. One dose is recommended for adults aged 30 years or older unless certain conditions are present.  Measles, mumps, and rubella (MMR) vaccine. Adults born  before 1957 generally are considered immune to measles and mumps. Adults born in 1957 or later should have 1 or more doses of MMR vaccine unless there is a contraindication to the vaccine or there is laboratory evidence of immunity to each of the three diseases. A routine second dose of MMR vaccine should be obtained at least 28 days after the first dose for students attending postsecondary schools, health care workers, or international travelers. People who received inactivated measles vaccine or an unknown type of measles vaccine during 1963-1967 should receive 2 doses of MMR vaccine. People who received inactivated mumps vaccine or an unknown type of mumps vaccine before 1979 and are at high risk for mumps infection should consider immunization with 2 doses of MMR vaccine. For females of childbearing age, rubella immunity should be determined. If there is no evidence of immunity, females who are not pregnant should be vaccinated. If there is no evidence of immunity, females who are pregnant should delay immunization until after pregnancy. Unvaccinated health care workers born before 1957 who lack laboratory evidence of measles, mumps, or rubella immunity or laboratory confirmation of disease should consider measles and mumps immunization with 2 doses of MMR vaccine or rubella immunization with 1 dose of MMR vaccine.  Pneumococcal 13-valent conjugate (PCV13) vaccine. When indicated, a person who is uncertain of his immunization history and has no record of immunization should receive the PCV13 vaccine. All adults 65 years of age and older should receive this  vaccine. An adult aged 19 years or older who has certain medical conditions and has not been previously immunized should receive 1 dose of PCV13 vaccine. This PCV13 should be followed with a dose of pneumococcal polysaccharide (PPSV23) vaccine. Adults who are at high risk for pneumococcal disease should obtain the PPSV23 vaccine at least 8 weeks after the dose of PCV13 vaccine. Adults older than 67 years of age who have normal immune system function should obtain the PPSV23 vaccine dose at least 1 year after the dose of PCV13 vaccine.  Pneumococcal polysaccharide (PPSV23) vaccine. When PCV13 is also indicated, PCV13 should be obtained first. All adults aged 65 years and older should be immunized. An adult younger than age 65 years who has certain medical conditions should be immunized. Any person who resides in a nursing home or long-term care facility should be immunized. An adult smoker should be immunized. People with an immunocompromised condition and certain other conditions should receive both PCV13 and PPSV23 vaccines. People with human immunodeficiency virus (HIV) infection should be immunized as soon as possible after diagnosis. Immunization during chemotherapy or radiation therapy should be avoided. Routine use of PPSV23 vaccine is not recommended for American Indians, [place] Natives, or people younger than 65 years unless there are medical conditions that require PPSV23 vaccine. When indicated, people who have unknown immunization and have no record of immunization should receive PPSV23 vaccine. One-time revaccination 5 years after the first dose of PPSV23 is recommended for people aged 19-64 years who have chronic kidney failure, nephrotic syndrome, asplenia, or immunocompromised conditions. People who received 1-2 doses of PPSV23 before age 65 years should receive another dose of PPSV23 vaccine at age 65 years or later if at least 5 years have passed since the previous dose. Doses of PPSV23 are not  needed for people immunized with PPSV23 at or after age 65 years.  Meningococcal vaccine. Adults with asplenia or persistent complement component deficiencies should receive 2 doses of quadrivalent meningococcal conjugate (MenACWY-D) vaccine. The doses should be obtained   at least 2 months apart. Microbiologists working with certain meningococcal bacteria, [place] recruits, people at risk during an outbreak, and people who travel to or live in countries with a high rate of meningitis should be immunized. A first-year college student up through age 34 years who is living in a residence hall should receive a dose if she did not receive a dose on or after her 16th birthday. Adults who have certain high-risk conditions should receive one or more doses of vaccine.  Hepatitis A vaccine. Adults who wish to be protected from this disease, have certain high-risk conditions, work with hepatitis A-infected animals, work in hepatitis A research labs, or travel to or work in countries with a high rate of hepatitis A should be immunized. Adults who were previously unvaccinated and who anticipate close contact with an international adoptee during the first 60 days after arrival in the [place] from a country with a high rate of hepatitis A should be immunized.  Hepatitis B vaccine. Adults who wish to be protected from this disease, have certain high-risk conditions, may be exposed to blood or other infectious body fluids, are household contacts or sex partners of hepatitis B positive people, are clients or workers in certain care facilities, or travel to or work in countries with a high rate of hepatitis B should be immunized.  Haemophilus influenzae type b (Hib) vaccine. A previously unvaccinated person with asplenia or sickle cell disease or having a scheduled splenectomy should receive 1 dose of Hib vaccine. Regardless of previous immunization, a recipient of a hematopoietic stem cell transplant should receive a  3-dose series 6-12 months after her successful transplant. Hib vaccine is not recommended for adults with HIV infection. Preventive Services / Frequency Ages 35 to 4 years  Blood pressure check.** / Every 3-5 years.  Lipid and cholesterol check.** / Every 5 years beginning at age 60.  Clinical breast exam.** / Every 3 years for women in their 71s and 10s.  BRCA-related cancer risk assessment.** / For women who have family members with a BRCA-related cancer (breast, ovarian, tubal, or peritoneal cancers).  Pap test.** / Every 2 years from ages 76 through 26. Every 3 years starting at age 61 through age 76 or 93 with a history of 3 consecutive normal Pap tests.  HPV screening.** / Every 3 years from ages 37 through ages 60 to 51 with a history of 3 consecutive normal Pap tests.  Hepatitis C blood test.** / For any individual with known risks for hepatitis C.  Skin self-exam. / Monthly.  Influenza vaccine. / Every year.  Tetanus, diphtheria, and acellular pertussis (Tdap, Td) vaccine.** / Consult your health care provider. Pregnant women should receive 1 dose of Tdap vaccine during each pregnancy. 1 dose of Td every 10 years.  Varicella vaccine.** / Consult your health care provider. Pregnant females who do not have evidence of immunity should receive the first dose after pregnancy.  HPV vaccine. / 3 doses over 6 months, if 93 and younger. The vaccine is not recommended for use in pregnant females. However, pregnancy testing is not needed before receiving a dose.  Measles, mumps, rubella (MMR) vaccine.** / You need at least 1 dose of MMR if you were born in 1957 or later. You may also need a 2nd dose. For females of childbearing age, rubella immunity should be determined. If there is no evidence of immunity, females who are not pregnant should be vaccinated. If there is no evidence of immunity, females who are  pregnant should delay immunization until after pregnancy.  Pneumococcal  13-valent conjugate (PCV13) vaccine.** / Consult your health care provider.  Pneumococcal polysaccharide (PPSV23) vaccine.** / 1 to 2 doses if you smoke cigarettes or if you have certain conditions.  Meningococcal vaccine.** / 1 dose if you are age 68 to 8 years and a Market researcher living in a residence hall, or have one of several medical conditions, you need to get vaccinated against meningococcal disease. You may also need additional booster doses.  Hepatitis A vaccine.** / Consult your health care provider.  Hepatitis B vaccine.** / Consult your health care provider.  Haemophilus influenzae type b (Hib) vaccine.** / Consult your health care provider. Ages 7 to 53 years  Blood pressure check.** / Every year.  Lipid and cholesterol check.** / Every 5 years beginning at age 25 years.  Lung cancer screening. / Every year if you are aged 11-80 years and have a 30-pack-year history of smoking and currently smoke or have quit within the past 15 years. Yearly screening is stopped once you have quit smoking for at least 15 years or develop a health problem that would prevent you from having lung cancer treatment.  Clinical breast exam.** / Every year after age 48 years.  BRCA-related cancer risk assessment.** / For women who have family members with a BRCA-related cancer (breast, ovarian, tubal, or peritoneal cancers).  Mammogram.** / Every year beginning at age 41 years and continuing for as long as you are in good health. Consult with your health care provider.  Pap test.** / Every 3 years starting at age 65 years through age 37 or 70 years with a history of 3 consecutive normal Pap tests.  HPV screening.** / Every 3 years from ages 72 years through ages 60 to 40 years with a history of 3 consecutive normal Pap tests.  Fecal occult blood test (FOBT) of stool. / Every year beginning at age 21 years and continuing until age 5 years. You may not need to do this test if you get  a colonoscopy every 10 years.  Flexible sigmoidoscopy or colonoscopy.** / Every 5 years for a flexible sigmoidoscopy or every 10 years for a colonoscopy beginning at age 35 years and continuing until age 48 years.  Hepatitis C blood test.** / For all people born from [birth year] through 1965 and any individual with known risks for hepatitis C.  Skin self-exam. / Monthly.  Influenza vaccine. / Every year.  Tetanus, diphtheria, and acellular pertussis (Tdap/Td) vaccine.** / Consult your health care provider. Pregnant women should receive 1 dose of Tdap vaccine during each pregnancy. 1 dose of Td every 10 years.  Varicella vaccine.** / Consult your health care provider. Pregnant females who do not have evidence of immunity should receive the first dose after pregnancy.  Zoster vaccine.** / 1 dose for adults aged 30 years or older.  Measles, mumps, rubella (MMR) vaccine.** / You need at least 1 dose of MMR if you were born in 1957 or later. You may also need a second dose. For females of childbearing age, rubella immunity should be determined. If there is no evidence of immunity, females who are not pregnant should be vaccinated. If there is no evidence of immunity, females who are pregnant should delay immunization until after pregnancy.  Pneumococcal 13-valent conjugate (PCV13) vaccine.** / Consult your health care provider.  Pneumococcal polysaccharide (PPSV23) vaccine.** / 1 to 2 doses if you smoke cigarettes or if you have certain conditions.  Meningococcal vaccine.** /  Consult your health care provider.  Hepatitis A vaccine.** / Consult your health care provider.  Hepatitis B vaccine.** / Consult your health care provider.  Haemophilus influenzae type b (Hib) vaccine.** / Consult your health care provider. Ages 64 years and over  Blood pressure check.** / Every year.  Lipid and cholesterol check.** / Every 5 years beginning at age 23 years.  Lung cancer screening. / Every year if you  are aged 16-80 years and have a 30-pack-year history of smoking and currently smoke or have quit within the past 15 years. Yearly screening is stopped once you have quit smoking for at least 15 years or develop a health problem that would prevent you from having lung cancer treatment.  Clinical breast exam.** / Every year after age 74 years.  BRCA-related cancer risk assessment.** / For women who have family members with a BRCA-related cancer (breast, ovarian, tubal, or peritoneal cancers).  Mammogram.** / Every year beginning at age 44 years and continuing for as long as you are in good health. Consult with your health care provider.  Pap test.** / Every 3 years starting at age 58 years through age 22 or 39 years with 3 consecutive normal Pap tests. Testing can be stopped between 65 and 70 years with 3 consecutive normal Pap tests and no abnormal Pap or HPV tests in the past 10 years.  HPV screening.** / Every 3 years from ages 64 years through ages 70 or 61 years with a history of 3 consecutive normal Pap tests. Testing can be stopped between 65 and 70 years with 3 consecutive normal Pap tests and no abnormal Pap or HPV tests in the past 10 years.  Fecal occult blood test (FOBT) of stool. / Every year beginning at age 40 years and continuing until age 27 years. You may not need to do this test if you get a colonoscopy every 10 years.  Flexible sigmoidoscopy or colonoscopy.** / Every 5 years for a flexible sigmoidoscopy or every 10 years for a colonoscopy beginning at age 7 years and continuing until age 32 years.  Hepatitis C blood test.** / For all people born from [birth year] through 1967 and any individual with known risks for hepatitis C.  Osteoporosis screening.** / A one-time screening for women ages 30 years and over and women at risk for fractures or osteoporosis.  Skin self-exam. / Monthly.  Influenza vaccine. / Every year.  Tetanus, diphtheria, and acellular pertussis (Tdap/Td)  vaccine.** / 1 dose of Td every 10 years.  Varicella vaccine.** / Consult your health care provider.  Zoster vaccine.** / 1 dose for adults aged 35 years or older.  Pneumococcal 13-valent conjugate (PCV13) vaccine.** / Consult your health care provider.  Pneumococcal polysaccharide (PPSV23) vaccine.** / 1 dose for all adults aged 46 years and older.  Meningococcal vaccine.** / Consult your health care provider.  Hepatitis A vaccine.** / Consult your health care provider.  Hepatitis B vaccine.** / Consult your health care provider.  Haemophilus influenzae type b (Hib) vaccine.** / Consult your health care provider. ** Family history and personal history of risk and conditions may change your health care provider's recommendations.   This information is not intended to replace advice given to you by your health care provider. Make sure you discuss any questions you have with your health care provider.   Document Released: 07/28/2001 Document Revised: 06/22/2014 Document Reviewed: 10/27/2010 Elsevier Interactive Patient Education [employer].

## 2015-08-02 NOTE — Progress Notes (Signed)
Pre visit review using our clinic review tool, if applicable. No additional management support is needed unless otherwise documented below in the visit note. 
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
## 2015-08-03 LAB — HEPATITIS C ANTIBODY: HCV Ab: NEGATIVE

## 2015-08-04 LAB — URINE CULTURE
COLONY COUNT: NO GROWTH
ORGANISM ID, BACTERIA: NO GROWTH

## 2015-08-04 NOTE — Assessment & Plan Note (Addendum)
con't benazepril stable

## 2015-08-08 ENCOUNTER — Other Ambulatory Visit: Payer: Self-pay

## 2015-08-08 DIAGNOSIS — I10 Essential (primary) hypertension: Secondary | ICD-10-CM

## 2015-08-08 MED ORDER — BENAZEPRIL HCL 10 MG PO TABS: ORAL_TABLET | ORAL | Status: DC

## 2015-08-13 ENCOUNTER — Telehealth: Payer: Self-pay | Admitting: Family Medicine

## 2015-08-13 DIAGNOSIS — R945 Abnormal results of liver function studies: Secondary | ICD-10-CM

## 2015-08-13 NOTE — Telephone Encounter (Signed)
Labs entered.

## 2015-08-14 ENCOUNTER — Telehealth: Payer: Self-pay | Admitting: Family Medicine

## 2015-08-14 NOTE — Telephone Encounter (Signed)
Relation to [id number] Call back number:[phone] Pharmacy:  Reason for call:  Patient states [employer] faxed over a form due to patient wheelchair tire going flat checking on the status. Advised patient to have manfuacture re fax to [phone]   Patient also checking on the status of [employer] faxing over a form for recliner lift chair the one she had for 10years broke and patient is checking if form was received, advised patient to have manufacture re fax to [phone].

## 2015-08-16 ENCOUNTER — Telehealth: Payer: Self-pay | Admitting: Family Medicine

## 2015-08-16 NOTE — Telephone Encounter (Signed)
Duplicate note, see [id number] note.

## 2015-08-16 NOTE — Telephone Encounter (Signed)
Shiquita C Johnson at 08/16/2015 9:30 AM     Status: Signed       Expand All Collapse All   Caller name: Sherwood Gambler   Can be reached: [phone]   Reason for call: Says that they need authorization from PCP stating that pt does need lift chair before they are able to replace and cover.        Received paperwork from [employer] for Written Orders for repairs and/or replacement parts for the mobility device; forwarded to provider/SLS 03/03

## 2015-08-16 NOTE — Telephone Encounter (Signed)
Caller name: Tobie Poet [facility]   Can be reached: [phone]   Reason for call: Says that they need authorization from PCP stating that pt does need lift chair before they are able to replace and cover.

## 2015-09-04 ENCOUNTER — Other Ambulatory Visit (INDEPENDENT_AMBULATORY_CARE_PROVIDER_SITE_OTHER): Payer: Medicare Other

## 2015-09-04 DIAGNOSIS — R945 Abnormal results of liver function studies: Secondary | ICD-10-CM

## 2015-09-04 DIAGNOSIS — K7689 Other specified diseases of liver: Secondary | ICD-10-CM | POA: Diagnosis not present

## 2015-09-04 LAB — HEPATIC FUNCTION PANEL
ALBUMIN: 3.9 g/dL (ref 3.5–5.2)
ALT: 21 U/L (ref 0–35)
AST: 17 U/L (ref 0–37)
Alkaline Phosphatase: 120 U/L — ABNORMAL HIGH (ref 39–117)
Bilirubin, Direct: 0.1 mg/dL (ref 0.0–0.3)
TOTAL PROTEIN: 7.3 g/dL (ref 6.0–8.3)
Total Bilirubin: 0.7 mg/dL (ref 0.2–1.2)

## 2015-09-12 ENCOUNTER — Encounter: Payer: Self-pay | Admitting: Family Medicine

## 2015-10-14 ENCOUNTER — Other Ambulatory Visit: Payer: Self-pay | Admitting: Family Medicine

## 2015-11-21 IMAGING — CR DG LUMBAR SPINE COMPLETE 4+V
5 series · 5 of 5 positions shown · non-contrast
Comparison: 04/04/2013

CLINICAL DATA: Fall.

EXAM:
LUMBAR SPINE - COMPLETE 4+ VIEW

[t l-spine lat *]
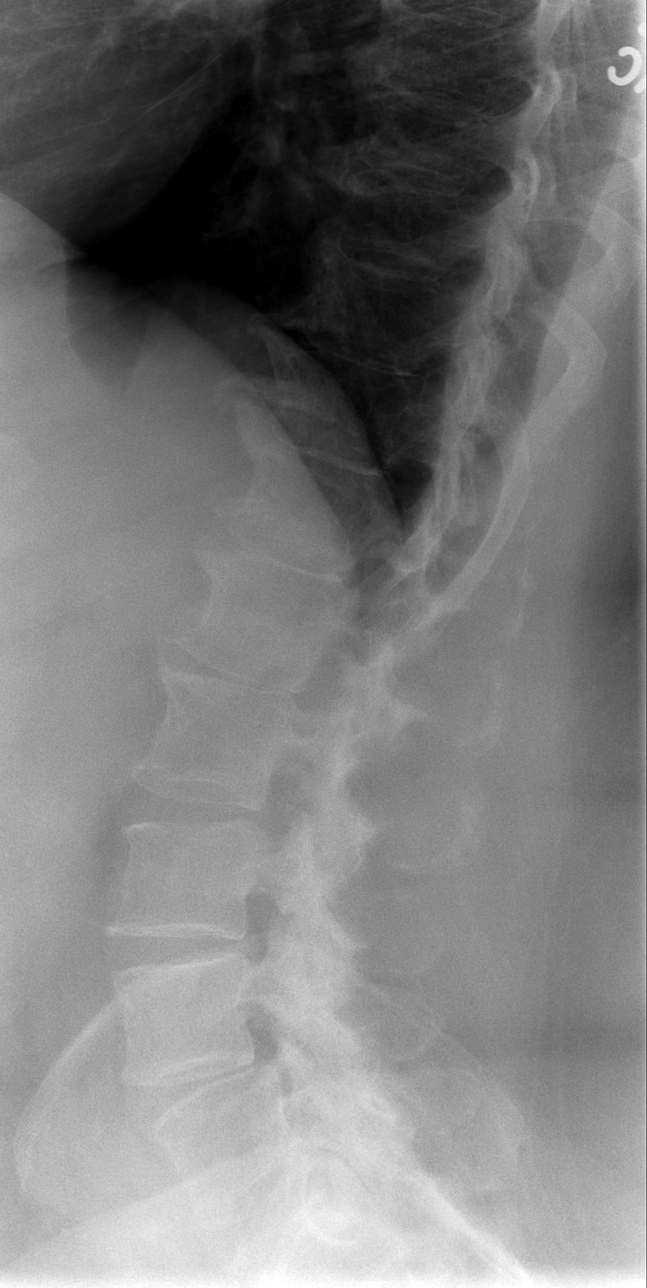

[t l-spine l5-s1 spot *]
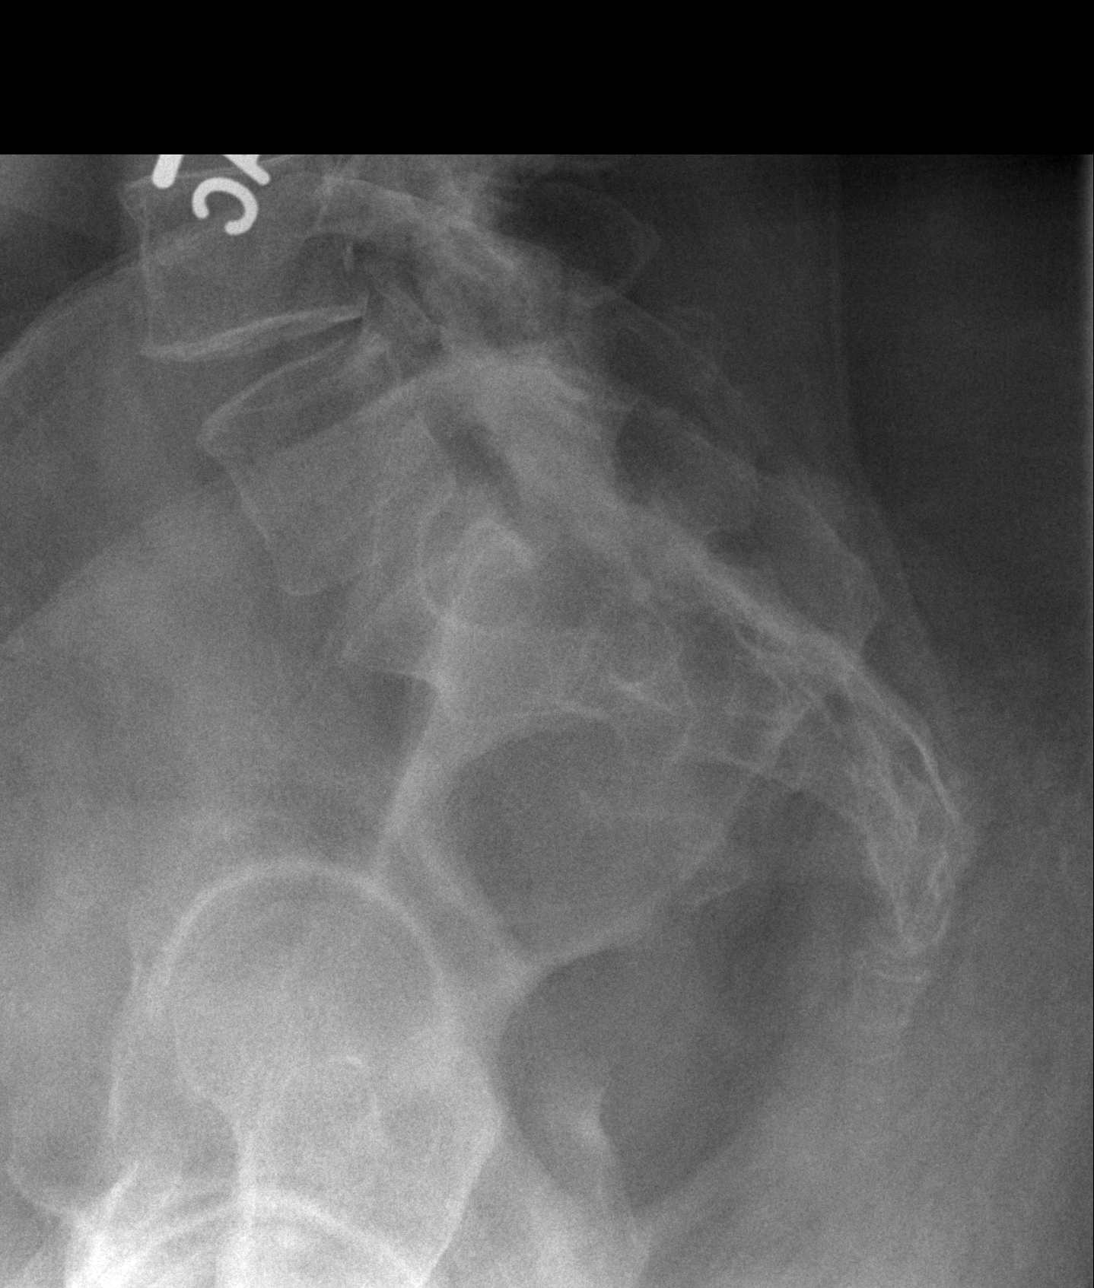

[t l-spine oblique exposure]
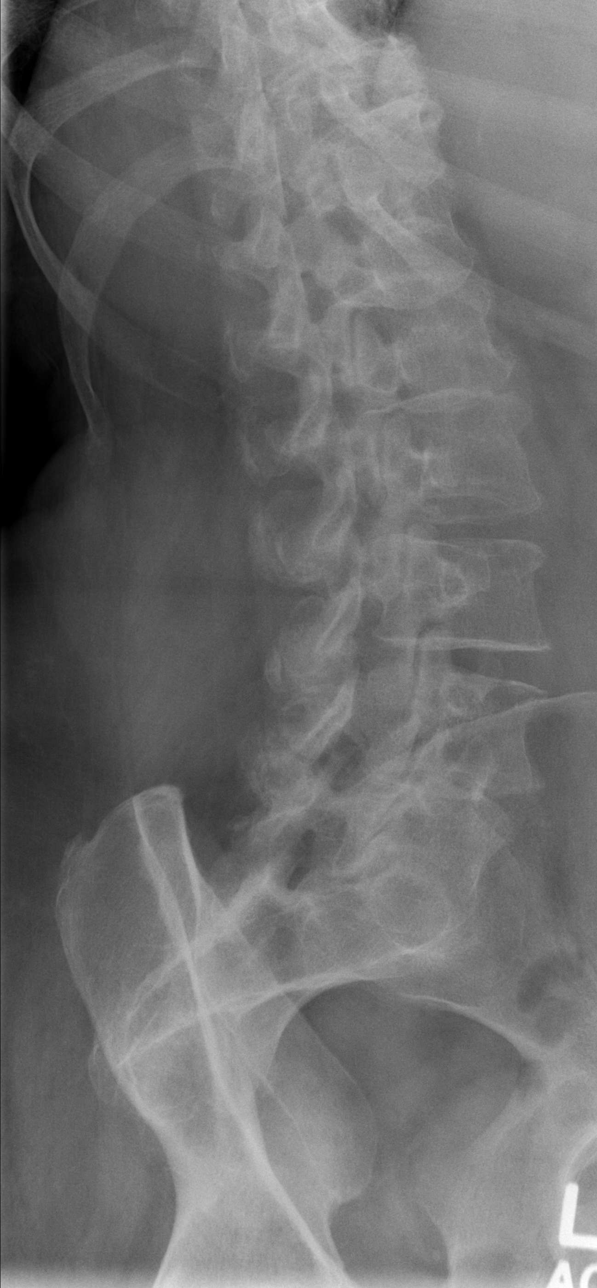

[t l-spine oblique exposure *]
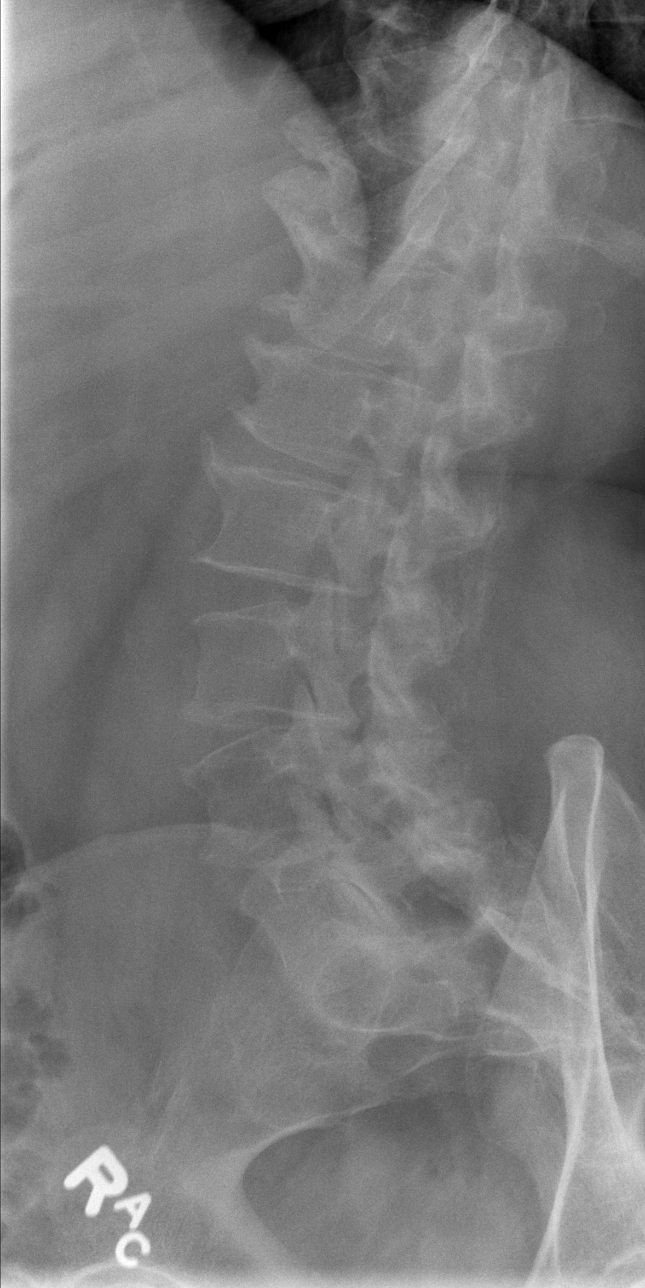

[t l-spine a.p. *]
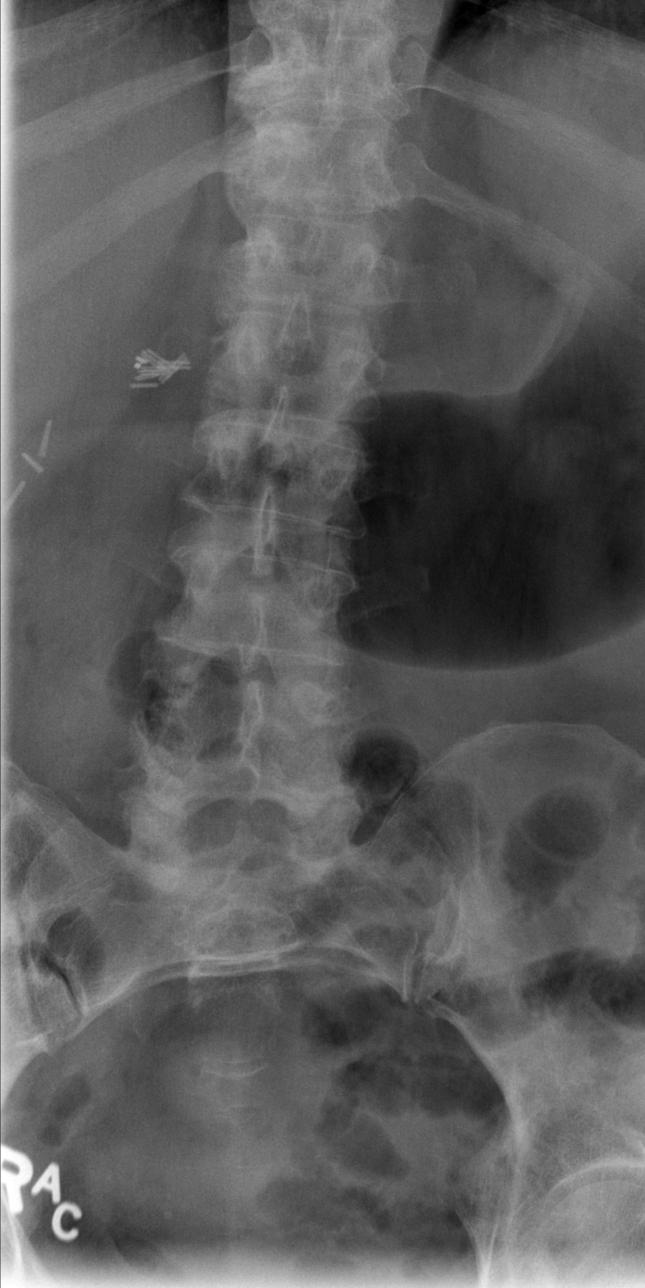

[5 of 5 positions shown; findings below may reference images not displayed]

FINDINGS: Normal alignment of the lumbar spine. There is multi level disc
space narrowing and ventral endplate spurring compatible with
degenerative disc disease. Facet hypertrophy and degenerative
changes noted. No fracture or subluxation.
IMPRESSION: 1. Lumbar degenerative disc disease.
2. No acute findings.

## 2015-11-21 IMAGING — CR DG KNEE COMPLETE 4+V*L*
4 series · 4 of 4 positions shown · non-contrast
Comparison: None.

CLINICAL DATA: Fall.

EXAM:
LEFT KNEE - COMPLETE 4+ VIEW

[t knee ap left *]
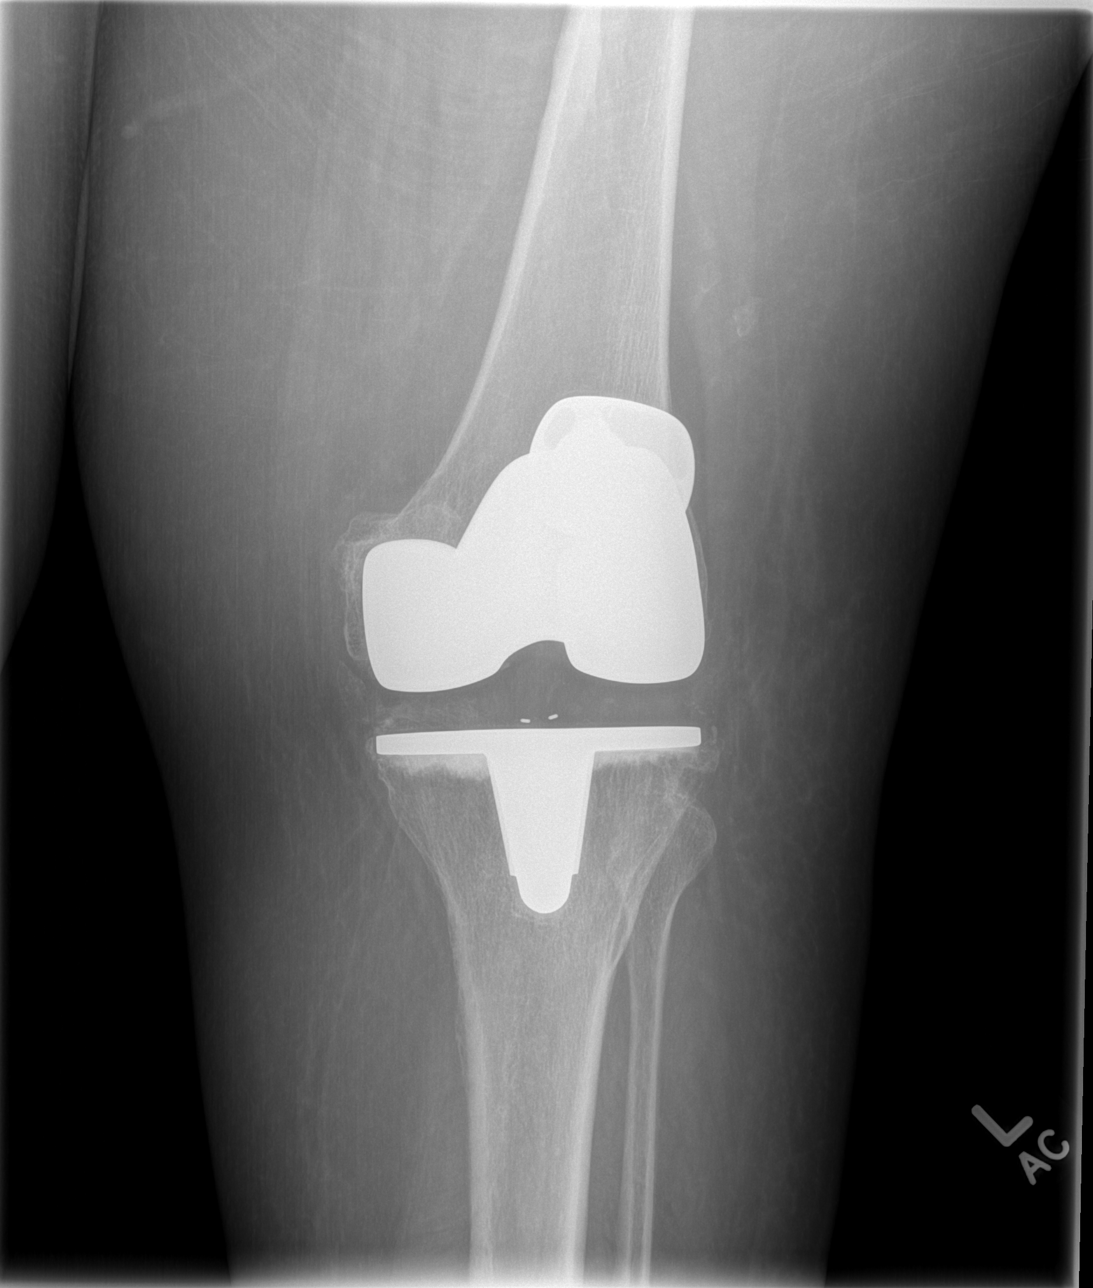

[t knee oblique left (1 of 2)]
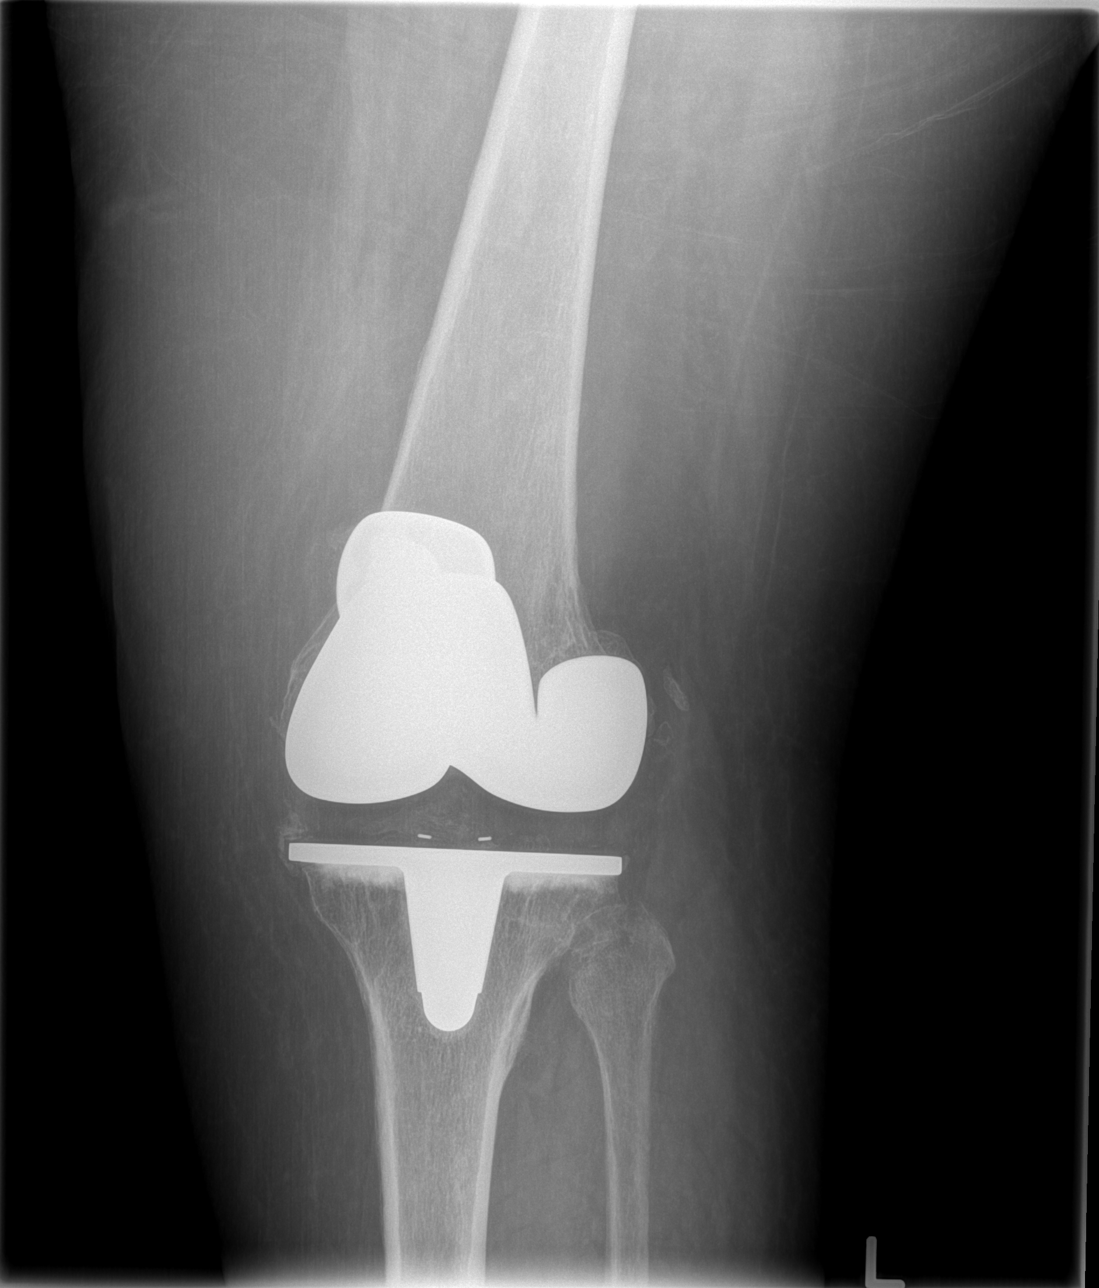

[t knee oblique left (2 of 2)]
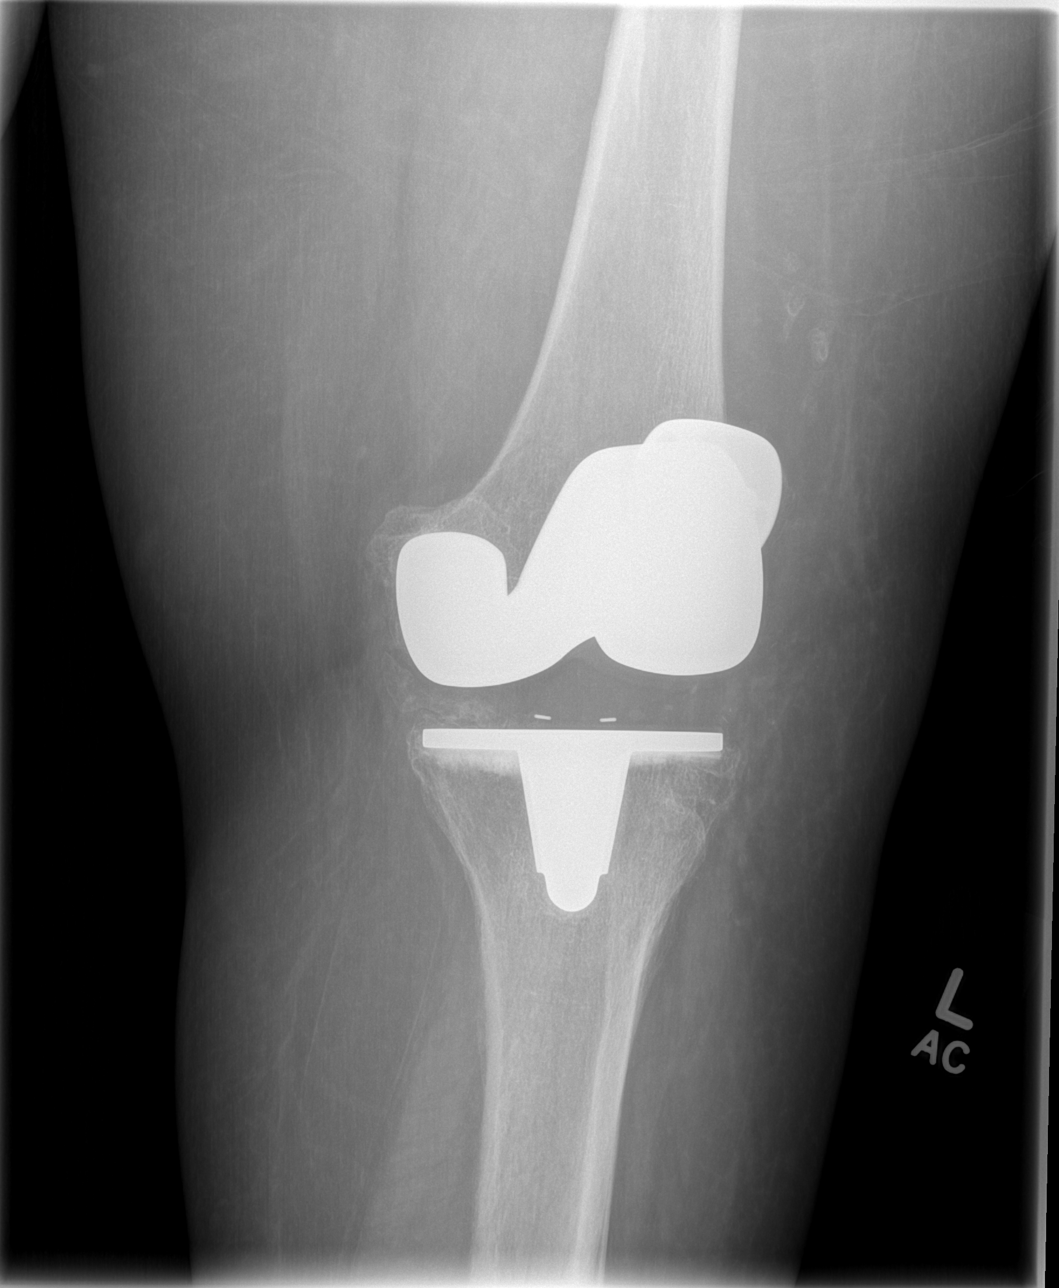

[t knee lat left]
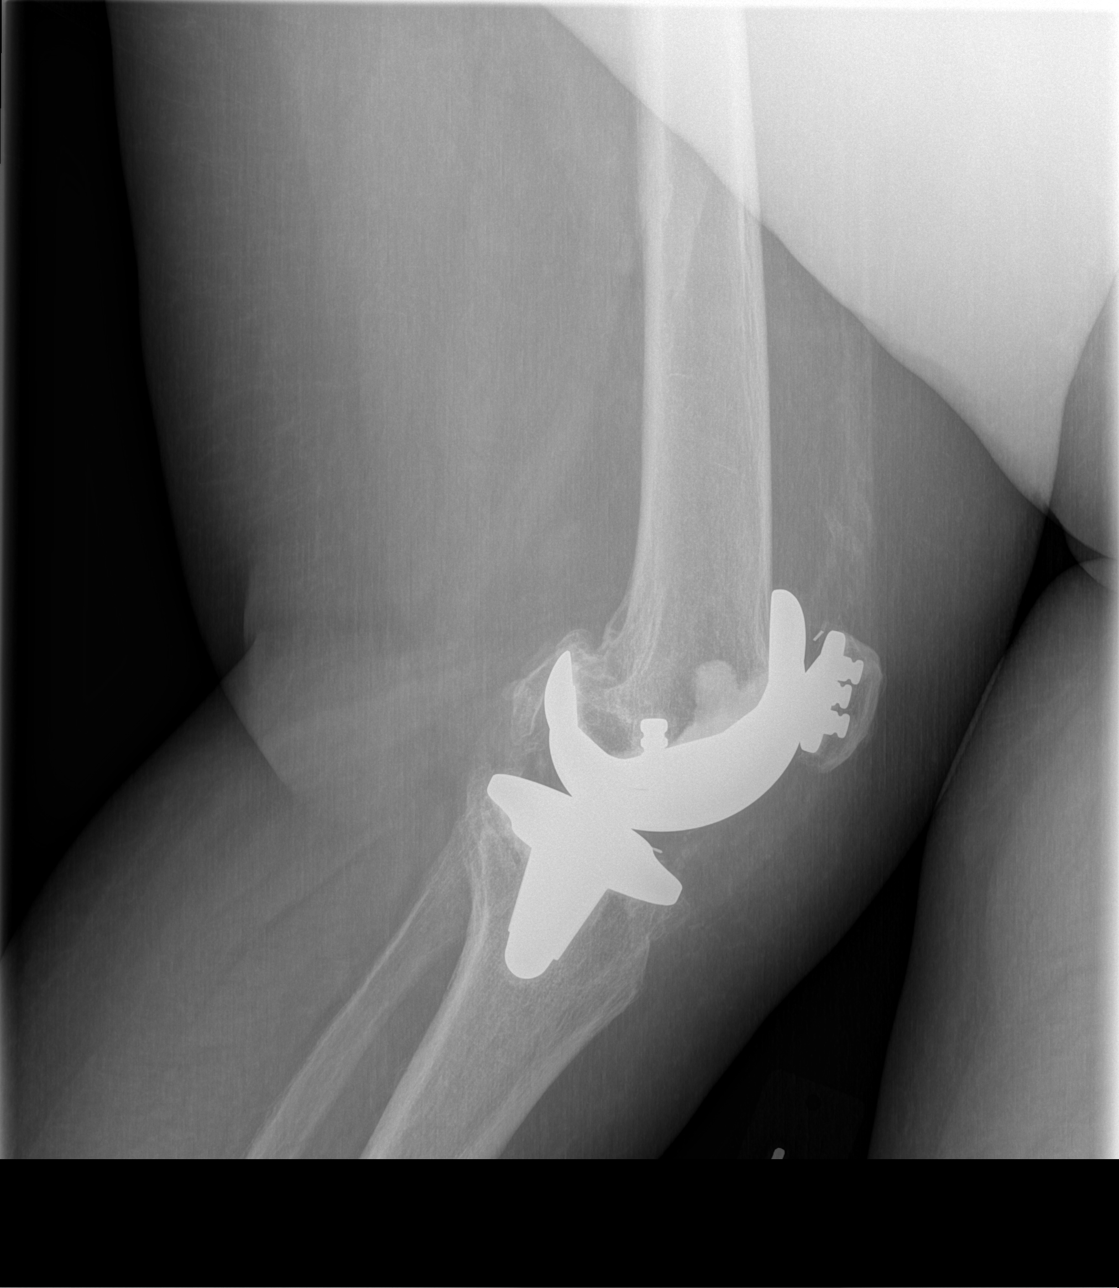

[4 of 4 positions shown; findings below may reference images not displayed]

FINDINGS: The bones appear osteopenic. The patient is status post left knee
arthroplasty. There is no fracture or subluxation identified.
IMPRESSION: 1. No acute findings.
2. Previous left knee arthroplasty.

## 2015-11-25 ENCOUNTER — Ambulatory Visit (INDEPENDENT_AMBULATORY_CARE_PROVIDER_SITE_OTHER): Payer: Medicare Other | Admitting: Family Medicine

## 2015-11-25 ENCOUNTER — Encounter: Payer: Self-pay | Admitting: Family Medicine

## 2015-11-25 VITALS — BP 124/84 | HR 87 | Temp 98.0°F | Wt 281.0 lb

## 2015-11-25 DIAGNOSIS — J069 Acute upper respiratory infection, unspecified: Secondary | ICD-10-CM | POA: Diagnosis not present

## 2015-11-25 DIAGNOSIS — R059 Cough, unspecified: Secondary | ICD-10-CM

## 2015-11-25 DIAGNOSIS — R05 Cough: Secondary | ICD-10-CM | POA: Diagnosis not present

## 2015-11-25 DIAGNOSIS — E785 Hyperlipidemia, unspecified: Secondary | ICD-10-CM

## 2015-11-25 DIAGNOSIS — I10 Essential (primary) hypertension: Secondary | ICD-10-CM

## 2015-11-25 DIAGNOSIS — E039 Hypothyroidism, unspecified: Secondary | ICD-10-CM | POA: Diagnosis not present

## 2015-11-25 DIAGNOSIS — M792 Neuralgia and neuritis, unspecified: Secondary | ICD-10-CM

## 2015-11-25 DIAGNOSIS — R8290 Unspecified abnormal findings in urine: Secondary | ICD-10-CM

## 2015-11-25 LAB — CBC WITH DIFFERENTIAL/PLATELET
Basophils Absolute: 0 10*3/uL (ref 0.0–0.1)
Basophils Relative: 0.3 % (ref 0.0–3.0)
EOS PCT: 1.6 % (ref 0.0–5.0)
Eosinophils Absolute: 0.1 10*3/uL (ref 0.0–0.7)
HCT: 33.5 % — ABNORMAL LOW (ref 36.0–46.0)
HEMOGLOBIN: 10.9 g/dL — AB (ref 12.0–15.0)
Lymphocytes Relative: 22.4 % (ref 12.0–46.0)
Lymphs Abs: 1.9 10*3/uL (ref 0.7–4.0)
MCHC: 32.5 g/dL (ref 30.0–36.0)
MCV: 84.9 fl (ref 78.0–100.0)
MONO ABS: 0.5 10*3/uL (ref 0.1–1.0)
MONOS PCT: 6.1 % (ref 3.0–12.0)
Neutro Abs: 6 10*3/uL (ref 1.4–7.7)
Neutrophils Relative %: 69.6 % (ref 43.0–77.0)
Platelets: 189 10*3/uL (ref 150.0–400.0)
RBC: 3.95 Mil/uL (ref 3.87–5.11)
RDW: 17.2 % — ABNORMAL HIGH (ref 11.5–15.5)
WBC: 8.6 10*3/uL (ref 4.0–10.5)

## 2015-11-25 LAB — COMPREHENSIVE METABOLIC PANEL
ALBUMIN: 3.7 g/dL (ref 3.5–5.2)
ALK PHOS: 114 U/L (ref 39–117)
ALT: 17 U/L (ref 0–35)
AST: 14 U/L (ref 0–37)
BUN: 14 mg/dL (ref 6–23)
CALCIUM: 9.6 mg/dL (ref 8.4–10.5)
CO2: 30 mEq/L (ref 19–32)
Chloride: 107 mEq/L (ref 96–112)
Creatinine, Ser: 0.57 mg/dL (ref 0.40–1.20)
GFR: 112.54 mL/min (ref >60.00)
Glucose, Bld: 110 mg/dL — ABNORMAL HIGH (ref 70–99)
POTASSIUM: 4.8 meq/L (ref 3.5–5.1)
Sodium: 144 mEq/L (ref 135–145)
TOTAL PROTEIN: 6.7 g/dL (ref 6.0–8.3)
Total Bilirubin: 0.6 mg/dL (ref 0.2–1.2)

## 2015-11-25 LAB — POCT URINALYSIS DIPSTICK
BILIRUBIN UA: NEGATIVE
GLUCOSE UA: NEGATIVE
KETONES UA: NEGATIVE
Leukocytes, UA: NEGATIVE
Nitrite, UA: NEGATIVE
PROTEIN UA: NEGATIVE
SPEC GRAV UA: 1.02
Urobilinogen, UA: NEGATIVE
pH, UA: 6

## 2015-11-25 LAB — LIPID PANEL
Cholesterol: 110 mg/dL (ref 0–200)
HDL: 33.5 mg/dL — ABNORMAL LOW (ref >39.00)
LDL Cholesterol: 54 mg/dL (ref 0–99)
NonHDL: 76.34
Total CHOL/HDL Ratio: 3
Triglycerides: 114 mg/dL (ref 0.0–149.0)
VLDL: 22.8 mg/dL (ref 0.0–40.0)

## 2015-11-25 LAB — TSH: TSH: 4.27 u[IU]/mL (ref 0.35–4.50)

## 2015-11-25 MED ORDER — GUAIFENESIN ER 600 MG PO TB12: 1200.0000 mg | ORAL_TABLET | Freq: Two times a day (BID) | ORAL | Status: DC | PRN

## 2015-11-25 MED ORDER — AMOXICILLIN-POT CLAVULANATE 875-125 MG PO TABS: 1.0000 | ORAL_TABLET | Freq: Two times a day (BID) | ORAL | Status: DC

## 2015-11-25 MED ORDER — SYNTHROID 75 MCG PO TABS: ORAL_TABLET | ORAL | Status: DC

## 2015-11-25 MED ORDER — LEVOCETIRIZINE DIHYDROCHLORIDE 5 MG PO TABS: 5.0000 mg | ORAL_TABLET | Freq: Every evening | ORAL | Status: DC

## 2015-11-25 MED ORDER — ATORVASTATIN CALCIUM 20 MG PO TABS: 20.0000 mg | ORAL_TABLET | Freq: Every day | ORAL | Status: DC

## 2015-11-25 MED ORDER — GABAPENTIN 100 MG PO CAPS: 100.0000 mg | ORAL_CAPSULE | Freq: Two times a day (BID) | ORAL | Status: DC

## 2015-11-25 MED ORDER — FUROSEMIDE 40 MG PO TABS: 40.0000 mg | ORAL_TABLET | Freq: Every day | ORAL | Status: DC

## 2015-11-25 MED ORDER — BENAZEPRIL HCL 10 MG PO TABS: ORAL_TABLET | ORAL | Status: DC

## 2015-11-25 MED ORDER — BENZONATATE 200 MG PO CAPS: 200.0000 mg | ORAL_CAPSULE | Freq: Three times a day (TID) | ORAL | Status: DC | PRN

## 2015-11-25 NOTE — Patient Instructions (Signed)
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
## 2015-11-25 NOTE — Addendum Note (Signed)
Addended by: Ewing Schlein on: 11/25/2015 03:06 PM   Modules accepted: Orders

## 2015-11-25 NOTE — Progress Notes (Signed)
Pre visit review using our clinic review tool, if applicable. No additional management support is needed unless otherwise documented below in the visit note. 

## 2015-11-27 LAB — URINE CULTURE
COLONY COUNT: NO GROWTH
Organism ID, Bacteria: NO GROWTH

## 2015-12-04 ENCOUNTER — Telehealth: Payer: Self-pay | Admitting: *Deleted

## 2015-12-04 DIAGNOSIS — D649 Anemia, unspecified: Secondary | ICD-10-CM

## 2015-12-04 NOTE — Telephone Encounter (Signed)
Notified pt and she states she has some otc ferrous sulfate 325mg  at home and she will take one of those a day as her current MVI does not include iron. Lab appt scheduled for 12/15/15 and orders entered. IFOB mailed to pt. Pt states she has f/u with PCP on 01/2016 and was told it was for labs and she is wanting to know if she needs to keep that appt?  Please advise?

## 2015-12-04 NOTE — Telephone Encounter (Signed)
-----   Message from [name], DO sent at 12/04/2015 10:36 AM EDT ----- Anemia--- check ifob,  Take mvi with iron and recheck in 1 month ---- cbcd, ibc, ferritin, b12

## 2015-12-05 NOTE — Telephone Encounter (Signed)
Discussed with patient and she had an apt scheduled in august as well for labs but they were just done, that apt has been cancelled.  KP

## 2015-12-05 NOTE — Telephone Encounter (Signed)
Will forward to pcp

## 2015-12-05 NOTE — Telephone Encounter (Signed)
If i understand this correctly--- the lab appointment is to recheck iron etc so yes ,  She shoulld keep it

## 2015-12-10 ENCOUNTER — Other Ambulatory Visit: Payer: Self-pay | Admitting: Family Medicine

## 2015-12-10 DIAGNOSIS — Z1231 Encounter for screening mammogram for malignant neoplasm of breast: Secondary | ICD-10-CM

## 2015-12-13 ENCOUNTER — Ambulatory Visit
Admission: RE | Admit: 2015-12-13 | Discharge: 2015-12-13 | Disposition: A | Discharge status: Home / Self Care | Payer: Medicare Other | Source: Ambulatory Visit | Attending: Family Medicine | Admitting: Family Medicine

## 2015-12-13 DIAGNOSIS — Z1231 Encounter for screening mammogram for malignant neoplasm of breast: Secondary | ICD-10-CM | POA: Diagnosis not present

## 2015-12-14 DIAGNOSIS — S92902A Unspecified fracture of left foot, initial encounter for closed fracture: Secondary | ICD-10-CM

## 2015-12-14 HISTORY — DX: Unspecified fracture of left foot, initial encounter for closed fracture: S92.902A

## 2015-12-30 ENCOUNTER — Telehealth: Payer: Self-pay | Admitting: *Deleted

## 2015-12-30 DIAGNOSIS — R296 Repeated falls: Secondary | ICD-10-CM

## 2015-12-30 DIAGNOSIS — R2681 Unsteadiness on feet: Secondary | ICD-10-CM

## 2015-12-30 DIAGNOSIS — M545 Low back pain, unspecified: Secondary | ICD-10-CM

## 2015-12-30 NOTE — Telephone Encounter (Signed)
Forwarded to Dr. Carollee Herter

## 2015-12-31 NOTE — Telephone Encounter (Signed)
Order reviewed and signed by provider.  Signed order faxed to [phone].   Fax confirmation received.

## 2016-01-03 ENCOUNTER — Other Ambulatory Visit (INDEPENDENT_AMBULATORY_CARE_PROVIDER_SITE_OTHER): Payer: Medicare Other

## 2016-01-03 DIAGNOSIS — D649 Anemia, unspecified: Secondary | ICD-10-CM | POA: Diagnosis not present

## 2016-01-03 LAB — CBC WITH DIFFERENTIAL/PLATELET
BASOS PCT: 0.4 % (ref 0.0–3.0)
Basophils Absolute: 0 10*3/uL (ref 0.0–0.1)
EOS PCT: 1 % (ref 0.0–5.0)
Eosinophils Absolute: 0.1 10*3/uL (ref 0.0–0.7)
HCT: 34.1 % — ABNORMAL LOW (ref 36.0–46.0)
Hemoglobin: 11.1 g/dL — ABNORMAL LOW (ref 12.0–15.0)
LYMPHS ABS: 2.1 10*3/uL (ref 0.7–4.0)
Lymphocytes Relative: 23.4 % (ref 12.0–46.0)
MCHC: 32.4 g/dL (ref 30.0–36.0)
MCV: 84.9 fl (ref 78.0–100.0)
MONO ABS: 0.4 10*3/uL (ref 0.1–1.0)
Monocytes Relative: 4.8 % (ref 3.0–12.0)
NEUTROS PCT: 70.4 % (ref 43.0–77.0)
Neutro Abs: 6.2 10*3/uL (ref 1.4–7.7)
Platelets: 216 10*3/uL (ref 150.0–400.0)
RBC: 4.02 Mil/uL (ref 3.87–5.11)
RDW: 17.6 % — AB (ref 11.5–15.5)
WBC: 8.8 10*3/uL (ref 4.0–10.5)

## 2016-01-03 LAB — IRON AND TIBC
%SAT: 18 % (ref 11–50)
Iron: 46 ug/dL (ref 45–160)
TIBC: 255 ug/dL (ref 250–450)
UIBC: 209 ug/dL (ref 125–400)

## 2016-01-03 LAB — FERRITIN: FERRITIN: 125.9 ng/mL (ref 10.0–291.0)

## 2016-01-03 LAB — VITAMIN B12: Vitamin B-12: 411 pg/mL (ref 211–911)

## 2016-01-05 ENCOUNTER — Encounter (HOSPITAL_BASED_OUTPATIENT_CLINIC_OR_DEPARTMENT_OTHER): Payer: Self-pay | Admitting: *Deleted

## 2016-01-05 ENCOUNTER — Emergency Department (HOSPITAL_BASED_OUTPATIENT_CLINIC_OR_DEPARTMENT_OTHER)
Admission: EM | Admit: 2016-01-05 | Discharge: 2016-01-05 | Disposition: A | Discharge status: Home / Self Care | Payer: Medicare Other | Attending: Emergency Medicine | Admitting: Emergency Medicine

## 2016-01-05 ENCOUNTER — Emergency Department (HOSPITAL_BASED_OUTPATIENT_CLINIC_OR_DEPARTMENT_OTHER): Payer: Medicare Other

## 2016-01-05 DIAGNOSIS — S92355A Nondisplaced fracture of fifth metatarsal bone, left foot, initial encounter for closed fracture: Secondary | ICD-10-CM | POA: Diagnosis not present

## 2016-01-05 DIAGNOSIS — M1990 Unspecified osteoarthritis, unspecified site: Secondary | ICD-10-CM | POA: Insufficient documentation

## 2016-01-05 DIAGNOSIS — S8992XA Unspecified injury of left lower leg, initial encounter: Secondary | ICD-10-CM | POA: Diagnosis not present

## 2016-01-05 DIAGNOSIS — I10 Essential (primary) hypertension: Secondary | ICD-10-CM | POA: Insufficient documentation

## 2016-01-05 DIAGNOSIS — Y999 Unspecified external cause status: Secondary | ICD-10-CM | POA: Insufficient documentation

## 2016-01-05 DIAGNOSIS — M810 Age-related osteoporosis without current pathological fracture: Secondary | ICD-10-CM | POA: Insufficient documentation

## 2016-01-05 DIAGNOSIS — Y939 Activity, unspecified: Secondary | ICD-10-CM | POA: Diagnosis not present

## 2016-01-05 DIAGNOSIS — Y929 Unspecified place or not applicable: Secondary | ICD-10-CM | POA: Insufficient documentation

## 2016-01-05 DIAGNOSIS — S50311A Abrasion of right elbow, initial encounter: Secondary | ICD-10-CM | POA: Insufficient documentation

## 2016-01-05 DIAGNOSIS — S92345A Nondisplaced fracture of fourth metatarsal bone, left foot, initial encounter for closed fracture: Secondary | ICD-10-CM | POA: Diagnosis not present

## 2016-01-05 DIAGNOSIS — Z87891 Personal history of nicotine dependence: Secondary | ICD-10-CM | POA: Insufficient documentation

## 2016-01-05 DIAGNOSIS — M79662 Pain in left lower leg: Secondary | ICD-10-CM | POA: Diagnosis not present

## 2016-01-05 DIAGNOSIS — M25572 Pain in left ankle and joints of left foot: Secondary | ICD-10-CM | POA: Diagnosis not present

## 2016-01-05 DIAGNOSIS — E785 Hyperlipidemia, unspecified: Secondary | ICD-10-CM | POA: Diagnosis not present

## 2016-01-05 DIAGNOSIS — Z791 Long term (current) use of non-steroidal anti-inflammatories (NSAID): Secondary | ICD-10-CM | POA: Diagnosis not present

## 2016-01-05 DIAGNOSIS — E039 Hypothyroidism, unspecified: Secondary | ICD-10-CM | POA: Insufficient documentation

## 2016-01-05 DIAGNOSIS — W19XXXA Unspecified fall, initial encounter: Secondary | ICD-10-CM

## 2016-01-05 DIAGNOSIS — Z79899 Other long term (current) drug therapy: Secondary | ICD-10-CM | POA: Insufficient documentation

## 2016-01-05 DIAGNOSIS — S99922A Unspecified injury of left foot, initial encounter: Secondary | ICD-10-CM | POA: Diagnosis present

## 2016-01-05 DIAGNOSIS — M25562 Pain in left knee: Secondary | ICD-10-CM | POA: Diagnosis not present

## 2016-01-05 DIAGNOSIS — S92302A Fracture of unspecified metatarsal bone(s), left foot, initial encounter for closed fracture: Secondary | ICD-10-CM

## 2016-01-05 HISTORY — DX: Disorder of thyroid, unspecified: E07.9

## 2016-01-05 MED ORDER — IBUPROFEN 600 MG PO TABS: 600.0000 mg | ORAL_TABLET | Freq: Four times a day (QID) | ORAL | 0 refills | Status: DC | PRN

## 2016-01-05 MED ORDER — HYDROCODONE-ACETAMINOPHEN 5-325 MG PO TABS: 1.0000 | ORAL_TABLET | ORAL | 0 refills | Status: DC | PRN
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
## 2016-01-05 NOTE — ED Provider Notes (Signed)
Medical screening examination/treatment/procedure(s) were conducted as a shared visit with non-physician practitioner(s) and myself.  I personally evaluated the patient during the encounter.  65 yo F w/ mechanical fall and subsequent lle trauma. On my exam patient already getting splint placed. Not able to move toes well (baseline) and not able to feel light touch too well (baseline). xr's with metatarsal fractures. Already in a wheelchair so will dc with ortho follow up for likely non op management.    Merrily Pew, MD 01/06/16 2308

## 2016-01-05 NOTE — ED Triage Notes (Signed)
Pt reports she was in her wheelchair outside around 0530 to get the newspaper, when her wheelchair malfunctioned and she fell out. Pt has L foot pain and swelling and abrasions to L big toe, L knee and R elbow. Denies hitting head, LOC. Denies taking blood thinners. Denies other injuries. Pt reports little feeling in L foot (states this is her known). Pt able to bear little weight on that foot.

## 2016-01-05 NOTE — Discharge Instructions (Signed)
Take your medications as an for pain relief. Refrain from bearing weight on your left foot until he follow up with orthopedics. I also recommend resting, elevating and applying ice to her left foot to help with pain and swelling. Call your orthopedic office tomorrow morning to schedule follow-up appointment for next week. Return to emergency department if symptoms worsen or new onset of fever, redness, swelling, warmth, numbness, tingling.

## 2016-01-06 NOTE — Telephone Encounter (Addendum)
Caller name: Almyra Free from [facility]   Call back number: [phone] fax # [phone] reference # [id number]     Reason for call:  As per [facility] wheelchair is completely not working in need of prior authorization for wheelchair and please fax over medical necessity to [place]

## 2016-01-07 NOTE — Telephone Encounter (Signed)
Called number listed below to follow up.  Rosemary from [facility] stated that reference number was not showing up in the system and that we would need to contact [employer] for next steps.

## 2016-01-08 DIAGNOSIS — S92355A Nondisplaced fracture of fifth metatarsal bone, left foot, initial encounter for closed fracture: Secondary | ICD-10-CM | POA: Diagnosis not present

## 2016-01-08 DIAGNOSIS — S92345A Nondisplaced fracture of fourth metatarsal bone, left foot, initial encounter for closed fracture: Secondary | ICD-10-CM | POA: Diagnosis not present

## 2016-01-09 ENCOUNTER — Other Ambulatory Visit: Payer: Self-pay | Admitting: Family Medicine

## 2016-01-09 DIAGNOSIS — I10 Essential (primary) hypertension: Secondary | ICD-10-CM

## 2016-01-09 NOTE — Telephone Encounter (Signed)
[place] and spoke to [place].  Judi stated that order had been received and has since been sent to the insurance dept.  She transferred call to Nira Conn in the insurance department and she says nothing had been sent to [employer] yet.  The order will next go to the pre-cert department.  The pre-cert process typically takes 5 days.

## 2016-01-14 ENCOUNTER — Encounter: Payer: Self-pay | Admitting: Family Medicine

## 2016-01-14 ENCOUNTER — Ambulatory Visit (HOSPITAL_BASED_OUTPATIENT_CLINIC_OR_DEPARTMENT_OTHER)
Admission: RE | Admit: 2016-01-14 | Discharge: 2016-01-14 | Disposition: A | Discharge status: Home / Self Care | Payer: Medicare Other | Source: Ambulatory Visit | Attending: Family Medicine | Admitting: Family Medicine

## 2016-01-14 ENCOUNTER — Ambulatory Visit (INDEPENDENT_AMBULATORY_CARE_PROVIDER_SITE_OTHER): Payer: Medicare Other | Admitting: Family Medicine

## 2016-01-14 VITALS — BP 120/64 | HR 76 | Temp 98.0°F | Ht 61.0 in | Wt 280.0 lb

## 2016-01-14 DIAGNOSIS — M79642 Pain in left hand: Secondary | ICD-10-CM | POA: Diagnosis not present

## 2016-01-14 DIAGNOSIS — M85842 Other specified disorders of bone density and structure, left hand: Secondary | ICD-10-CM | POA: Diagnosis not present

## 2016-01-14 DIAGNOSIS — S92302A Fracture of unspecified metatarsal bone(s), left foot, initial encounter for closed fracture: Secondary | ICD-10-CM

## 2016-01-14 DIAGNOSIS — M21942 Unspecified acquired deformity of hand, left hand: Secondary | ICD-10-CM | POA: Insufficient documentation

## 2016-01-14 DIAGNOSIS — S92309A Fracture of unspecified metatarsal bone(s), unspecified foot, initial encounter for closed fracture: Secondary | ICD-10-CM | POA: Insufficient documentation

## 2016-01-14 DIAGNOSIS — S6992XA Unspecified injury of left wrist, hand and finger(s), initial encounter: Secondary | ICD-10-CM | POA: Diagnosis not present

## 2016-01-14 NOTE — Assessment & Plan Note (Signed)
Pt to see ortho next wed con't with boot
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
## 2016-01-14 NOTE — Telephone Encounter (Signed)
Pt in for appt today inquiring about order for wheelchair.  [place] and spoke with [employer]. Judi stated that order were currently at insurance level, but was only for parts and repairs (Order # [id number]).  She was informed that patient actually needs a new wheelchair.  She was informed that patient recently had to go to the ER because she fell out of her wheelchair.  Per the ER report patient reported "she was in her electric wheelchair going outside to get her newspaper when the wheelchair 'malfunctioned' resulting in the wheelchair jerking and having the patient fall out of the chair forward onto the cement."  Judi stated that [employer] was never informed of this incident and that the patient would need call Tech Support @ [phone] and report what happened and a new order will need to be put in.  The same was relayed to the patient, including the number to tech support and the order number.  Dr. Etter Sjogren was also present in the room while the situation was being discussed and Dr. Etter Sjogren asked that we contact [place] to see if they could assist patient in getting wheelchair.

## 2016-01-14 NOTE — Telephone Encounter (Addendum)
Called [name] at [facility] and discussed the situation with her.  She said that [facility] does not have an affiliation with [employer], but she could possibly work on getting patient a motorized wheelchair through [employer].  She asked that we put in an order in an Epic. .    Order placed and staff message sent to [facility] in [place].

## 2016-01-14 NOTE — Patient Instructions (Signed)
Fall Prevention in the Home  Falls can cause injuries and can affect people from all age groups. There are many simple things that you can do to make your home safe and to help prevent falls. WHAT CAN I DO ON THE OUTSIDE OF MY HOME?  Regularly repair the edges of walkways and driveways and fix any cracks.  Remove high doorway thresholds.  Trim any shrubbery on the main path into your home.  Use bright outdoor lighting.  Clear walkways of debris and clutter, including tools and rocks.  Regularly check that handrails are securely fastened and in good repair. Both sides of any steps should have handrails.  Install guardrails along the edges of any raised decks or porches.  Have leaves, snow, and ice cleared regularly.  Use sand or salt on walkways during winter months.  In the garage, clean up any spills right away, including grease or oil spills. WHAT CAN I DO IN THE BATHROOM?  Use night lights.  Install grab bars by the toilet and in the tub and shower. Do not use towel bars as grab bars.  Use non-skid mats or decals on the floor of the tub or shower.  If you need to sit down while you are in the shower, use a plastic, non-slip stool..  Keep the floor dry. Immediately clean up any water that spills on the floor.  Remove soap buildup in the tub or shower on a regular basis.  Attach bath mats securely with double-sided non-slip rug tape.  Remove throw rugs and other tripping hazards from the floor. WHAT CAN I DO IN THE BEDROOM?  Use night lights.  Make sure that a bedside light is easy to reach.  Do not use oversized bedding that drapes onto the floor.  Have a firm chair that has side arms to use for getting dressed.  Remove throw rugs and other tripping hazards from the floor. WHAT CAN I DO IN THE KITCHEN?   Clean up any spills right away.  Avoid walking on wet floors.  Place frequently used items in easy-to-reach places.  If you need to reach for something  above you, use a sturdy step stool that has a grab bar.  Keep electrical cables out of the way.  Do not use floor polish or wax that makes floors slippery. If you have to use wax, make sure that it is non-skid floor wax.  Remove throw rugs and other tripping hazards from the floor. WHAT CAN I DO IN THE STAIRWAYS?  Do not leave any items on the stairs.  Make sure that there are handrails on both sides of the stairs. Fix handrails that are broken or loose. Make sure that handrails are as long as the stairways.  Check any carpeting to make sure that it is firmly attached to the stairs. Fix any carpet that is loose or worn.  Avoid having throw rugs at the top or bottom of stairways, or secure the rugs with carpet tape to prevent them from moving.  Make sure that you have a light switch at the top of the stairs and the bottom of the stairs. If you do not have them, have them installed. WHAT ARE SOME OTHER FALL PREVENTION TIPS?  Wear closed-toe shoes that fit well and support your feet. Wear shoes that have rubber soles or low heels.  When you use a stepladder, make sure that it is completely opened and that the sides are firmly locked. Have someone hold the ladder while you   are using it. Do not climb a closed stepladder.  Add color or contrast paint or tape to grab bars and handrails in your home. Place contrasting color strips on the first and last steps.  Use mobility aids as needed, such as canes, walkers, scooters, and crutches.  Turn on lights if it is dark. Replace any light bulbs that burn out.  Set up furniture so that there are clear paths. Keep the furniture in the same spot.  Fix any uneven floor surfaces.  Choose a carpet design that does not hide the edge of steps of a stairway.  Be aware of any and all pets.  Review your medicines with your healthcare provider. Some medicines can cause dizziness or changes in blood pressure, which increase your risk of falling. Talk  with your health care provider about other ways that you can decrease your risk of falls. This may include working with a physical therapist or trainer to improve your strength, balance, and endurance.   This information is not intended to replace advice given to you by your health care provider. Make sure you discuss any questions you have with your health care provider.   Document Released: 05/22/2002 Document Revised: 10/16/2014 Document Reviewed: 07/06/2014 Elsevier Interactive Patient Education 2016 Elsevier Inc.  

## 2016-01-14 NOTE — Addendum Note (Signed)
Addended by: Rudene Anda on: 01/14/2016 04:02 PM   Modules accepted: Orders

## 2016-01-14 NOTE — Progress Notes (Signed)
Pre visit review using our clinic review tool, if applicable. No additional management support is needed unless otherwise documented below in the visit note. 

## 2016-01-20 NOTE — Telephone Encounter (Signed)
Patient calling checking on the status of message below.

## 2016-01-22 DIAGNOSIS — S92345D Nondisplaced fracture of fourth metatarsal bone, left foot, subsequent encounter for fracture with routine healing: Secondary | ICD-10-CM | POA: Diagnosis not present

## 2016-01-22 NOTE — Telephone Encounter (Signed)
[place] with [facility] at [phone] for update regarding below. She stated motorized wheelchairs are handled through their Rehab Dept and that she would get their number and either call me back with contact info or have them return call directly.

## 2016-01-23 NOTE — Telephone Encounter (Signed)
Have not received call back from [facility] or [facility]. Staff message sent to [facility] for update on below.

## 2016-01-23 NOTE — Telephone Encounter (Signed)
Per [name]: Also, my rehab dept spoke with the pt and determined that she is not eligible for a new power wheel chair for another 3 years.

## 2016-01-31 ENCOUNTER — Ambulatory Visit: Payer: Medicare Other | Admitting: Family Medicine

## 2016-02-03 DIAGNOSIS — S92345D Nondisplaced fracture of fourth metatarsal bone, left foot, subsequent encounter for fracture with routine healing: Secondary | ICD-10-CM | POA: Diagnosis not present

## 2016-02-04 DIAGNOSIS — G8190 Hemiplegia, unspecified affecting unspecified side: Secondary | ICD-10-CM | POA: Diagnosis not present

## 2016-02-04 DIAGNOSIS — M1990 Unspecified osteoarthritis, unspecified site: Secondary | ICD-10-CM | POA: Diagnosis not present

## 2016-02-04 DIAGNOSIS — Z9181 History of falling: Secondary | ICD-10-CM | POA: Diagnosis not present

## 2016-02-04 DIAGNOSIS — M6281 Muscle weakness (generalized): Secondary | ICD-10-CM | POA: Diagnosis not present

## 2016-02-14 DIAGNOSIS — H524 Presbyopia: Secondary | ICD-10-CM | POA: Diagnosis not present

## 2016-02-22 DIAGNOSIS — M899 Disorder of bone, unspecified: Secondary | ICD-10-CM | POA: Diagnosis not present

## 2016-02-22 DIAGNOSIS — Z13820 Encounter for screening for osteoporosis: Secondary | ICD-10-CM | POA: Diagnosis not present

## 2016-02-22 LAB — HM DEXA SCAN

## 2016-03-04 ENCOUNTER — Telehealth: Payer: Self-pay | Admitting: *Deleted

## 2016-03-04 NOTE — Telephone Encounter (Signed)
Received Clinical Utilization Review via fax from [facility] for power wheelchair.  Tried on several attempts to reach Coral [employer].   [facility] on (02/10/16 and 02/19/16),but never heard back.  Had spoken with the Kelly [name] on several occassions and she stated that she has been in contact with the company and an appointment has been set-up for them to come out and repair the wheelchair.  Spoke with the Kelly [name] today and she stated that they came out on (Thurs-02/27/16) and repaired her wheelchair.  Paperwork was disregarded and sent for scanning.//AB/CMA

## 2016-03-27 ENCOUNTER — Encounter: Payer: Self-pay | Admitting: Family Medicine

## 2016-04-06 ENCOUNTER — Other Ambulatory Visit: Payer: Self-pay | Admitting: Family Medicine

## 2016-04-08 ENCOUNTER — Telehealth: Payer: Self-pay | Admitting: Family Medicine

## 2016-04-08 NOTE — Telephone Encounter (Signed)
Patient stated her CNA has waited about 30-40 minutes before she takes her pulse, notices when she sits down that she falls asleep as well. All week pulse has been going up to the 90's with no warning, daily routine has been normal, nothing out of the ordinary. Advised patient to have CNA check her pulse first thing in the am then after an hour of her resting. Patient will call back to give pulse in the am. Please advise PC

## 2016-04-08 NOTE — Telephone Encounter (Signed)
Caller name: Relationship to patient: Self Can be reached: [phone] Pharmacy:  Reason for call: Patient request call back to discuss her pulse rate. States her CNA has taken her pulse everyday this week and it has been running 92, 96 and 98. Please adv

## 2016-04-09 NOTE — Telephone Encounter (Signed)
Unless it was good this am --- sounds like she needs appointment

## 2016-04-09 NOTE — Telephone Encounter (Signed)
Called patient back and left a detailed voicemail letting her know to leave her pulse numbers with whomever she speaks with. Message me back with pulse, and I will contact Dr. Etter Sjogren and will advise then. Patient was suppose to leave pulse numbers this am, for further evalutaion PC

## 2016-04-09 NOTE — Telephone Encounter (Signed)
Pt says that she is calling in returning call.

## 2016-04-09 NOTE — Telephone Encounter (Signed)
Patient said she took her pulse three times today.   6:00- BP164/80  Pulse- 98  7:30- BP 113/65   Pulse- 78  8:50- BP- 116-63  Pulse- 87

## 2016-04-10 ENCOUNTER — Encounter: Payer: Self-pay | Admitting: Family Medicine

## 2016-04-10 ENCOUNTER — Ambulatory Visit (INDEPENDENT_AMBULATORY_CARE_PROVIDER_SITE_OTHER): Payer: Medicare Other | Admitting: Family Medicine

## 2016-04-10 VITALS — BP 126/80 | HR 82 | Temp 97.6°F | Resp 14 | Ht 61.0 in | Wt 286.1 lb

## 2016-04-10 DIAGNOSIS — Z711 Person with feared health complaint in whom no diagnosis is made: Secondary | ICD-10-CM

## 2016-04-10 NOTE — Patient Instructions (Signed)
Make sure you aren't drinking caffeinated beverages before you check your blood pressure.   If you start having chest pain, shortness of breath, or fluttering in your chest, seek medical care or let our office know right away.

## 2016-04-10 NOTE — Progress Notes (Signed)
Pre visit review using our clinic review tool, if applicable. No additional management support is needed unless otherwise documented below in the visit note. 
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
## 2016-04-27 ENCOUNTER — Telehealth: Payer: Self-pay | Admitting: Family Medicine

## 2016-04-27 NOTE — Telephone Encounter (Signed)
Patient called to have it doccummented in her chart that she had her Flu Shot and Shingles Vac on 03/07/2016 @ [facility] and they are sending the records to the office

## 2016-04-28 NOTE — Telephone Encounter (Signed)
Pt chart updated with flu vac and shingles vac, self reported by pt. Given by [facility].

## 2016-05-11 ENCOUNTER — Ambulatory Visit (INDEPENDENT_AMBULATORY_CARE_PROVIDER_SITE_OTHER): Payer: Medicare Other | Admitting: Medical

## 2016-05-11 ENCOUNTER — Encounter: Payer: Self-pay | Admitting: Medical

## 2016-05-11 ENCOUNTER — Ambulatory Visit (HOSPITAL_BASED_OUTPATIENT_CLINIC_OR_DEPARTMENT_OTHER)
Admission: RE | Admit: 2016-05-11 | Discharge: 2016-05-11 | Disposition: A | Discharge status: Home / Self Care | Payer: Medicare Other | Source: Ambulatory Visit | Attending: Medical | Admitting: Medical

## 2016-05-11 VITALS — BP 124/82 | HR 78 | Temp 98.1°F | Ht 61.0 in | Wt 290.0 lb

## 2016-05-11 DIAGNOSIS — R062 Wheezing: Secondary | ICD-10-CM

## 2016-05-11 DIAGNOSIS — J0100 Acute maxillary sinusitis, unspecified: Secondary | ICD-10-CM

## 2016-05-11 DIAGNOSIS — R05 Cough: Secondary | ICD-10-CM | POA: Insufficient documentation

## 2016-05-11 DIAGNOSIS — J209 Acute bronchitis, unspecified: Secondary | ICD-10-CM | POA: Diagnosis not present

## 2016-05-11 DIAGNOSIS — M47894 Other spondylosis, thoracic region: Secondary | ICD-10-CM | POA: Insufficient documentation

## 2016-05-11 DIAGNOSIS — R059 Cough, unspecified: Secondary | ICD-10-CM

## 2016-05-11 DIAGNOSIS — J3089 Other allergic rhinitis: Secondary | ICD-10-CM

## 2016-05-11 LAB — CBC WITH DIFFERENTIAL/PLATELET
Basophils Absolute: 0 10*3/uL (ref 0.0–0.1)
Basophils Relative: 0.3 % (ref 0.0–3.0)
EOS PCT: 2.3 % (ref 0.0–5.0)
Eosinophils Absolute: 0.2 10*3/uL (ref 0.0–0.7)
HCT: 35.1 % — ABNORMAL LOW (ref 36.0–46.0)
Hemoglobin: 11.4 g/dL — ABNORMAL LOW (ref 12.0–15.0)
LYMPHS ABS: 1.4 10*3/uL (ref 0.7–4.0)
Lymphocytes Relative: 20.4 % (ref 12.0–46.0)
MCHC: 32.7 g/dL (ref 30.0–36.0)
MCV: 85.5 fl (ref 78.0–100.0)
MONO ABS: 0.4 10*3/uL (ref 0.1–1.0)
Monocytes Relative: 5.8 % (ref 3.0–12.0)
NEUTROS ABS: 4.8 10*3/uL (ref 1.4–7.7)
NEUTROS PCT: 71.2 % (ref 43.0–77.0)
PLATELETS: 199 10*3/uL (ref 150.0–400.0)
RBC: 4.1 Mil/uL (ref 3.87–5.11)
RDW: 17.1 % — ABNORMAL HIGH (ref 11.5–15.5)
WBC: 6.7 10*3/uL (ref 4.0–10.5)

## 2016-05-11 MED ORDER — PREDNISONE 10 MG PO TABS: ORAL_TABLET | ORAL | 0 refills | Status: DC

## 2016-05-11 MED ORDER — DOXYCYCLINE HYCLATE 100 MG PO TABS: 100.0000 mg | ORAL_TABLET | Freq: Two times a day (BID) | ORAL | 0 refills | Status: DC

## 2016-05-11 MED ORDER — HYDROCODONE-HOMATROPINE 5-1.5 MG/5ML PO SYRP: 5.0000 mL | ORAL_SOLUTION | Freq: Three times a day (TID) | ORAL | 0 refills | Status: DC | PRN

## 2016-05-11 MED ORDER — ALBUTEROL SULFATE HFA 108 (90 BASE) MCG/ACT IN AERS: 2.0000 | INHALATION_SPRAY | Freq: Four times a day (QID) | RESPIRATORY_TRACT | 0 refills | Status: DC | PRN

## 2016-05-11 MED ORDER — FLUTICASONE PROPIONATE 50 MCG/ACT NA SUSP: 2.0000 | Freq: Every day | NASAL | 1 refills | Status: DC
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
## 2016-05-11 NOTE — Progress Notes (Signed)
Pre visit review using our clinic review tool, if applicable. No additional management support is needed unless otherwise documented below in the visit note. 

## 2016-05-11 NOTE — Patient Instructions (Addendum)
You appear to have bronchitis and sinusitis. Rest hydrate and tylenol for fever. I am prescribing cough medicine hycdoan, and doxycycline antibiotic. For your nasal congestion rx flonase.  I do want to get cxr today to evaluate if you have pneumonia.Also want to get cbc today.  For wheezing will rx albuterol. If having to use albuterol frequently then add taper prednisone.  Follow up in 7-10 days or as needed

## 2016-05-12 ENCOUNTER — Telehealth: Payer: Self-pay | Admitting: Medical

## 2016-05-12 NOTE — Telephone Encounter (Signed)
Pt had upset stomach and vomited one time  after taking doxy on empty stomach. I advised to take after some food in stomach. If still bothers her then stop doxy notify me and would change antibiotic.

## 2016-05-13 ENCOUNTER — Telehealth: Payer: Self-pay | Admitting: *Deleted

## 2016-05-13 NOTE — Telephone Encounter (Signed)
Pt called complaining of urinary frequency. States she has been constantly urinating on herself since Tuesday. As a result, she is no longer taking her "fluid pill". States she is using pads, but has had to change clothes 4x today. Pt thought it may have been a result of a medication that she was prescribed on Monday for her bronchitis so she called her pharmacist whom assured her it was not from the meds she got filled on Monday. Pt is requesting something to help her stop urinating on herself, but states she is not able to travel or come in given her current problem. Please advise.

## 2016-05-15 NOTE — Telephone Encounter (Signed)
Was seen recently with respiratory symptoms, DX bronchitis, sinusitis, chest x-ray negative, CBC essentially normal. Prescribed albuterol, doxycycline, prednisone taper.  Unclear why she is having symptoms but I recommend her to stop prednisone and continue the rest of medicines. Go back on her "fluid pill". Call if not improving in the next few days.

## 2016-05-15 NOTE — Telephone Encounter (Signed)
Please advise in PCP absence.  

## 2016-05-15 NOTE — Telephone Encounter (Signed)
Pt states urinary frequency is improving.Pt notified of instructions and verbalized understanding.

## 2016-06-04 ENCOUNTER — Telehealth: Payer: Self-pay | Admitting: Family Medicine

## 2016-06-04 NOTE — Telephone Encounter (Signed)
Call from [place] with [place] request for incontinence supplies to [phone].

## 2016-06-11 NOTE — Telephone Encounter (Signed)
Caller name: Tammy  Relation to pt: [employer]  Call back number: [phone]    Reason for call:  Representative re faxing form to [phone] Attention [facility]

## 2016-06-17 NOTE — Telephone Encounter (Signed)
Spoke with Rep Tammy from [employer], informed Tammy I have not received a fax. Request for Rep to resend form to main fax. Rep state she will send fax today at [phone]. LB

## 2016-06-19 NOTE — Telephone Encounter (Signed)
Tammy with [employer] called to find out if we have received her fax. I let her know that if she had not refaxed since 06/17/16 then we have not. She stated she hadn't but would refax today and also mail the request for incontinence supplies.

## 2016-06-29 NOTE — Telephone Encounter (Signed)
Caller name; Tammy  Relation to pt: [employer]  Call back number: [phone]    Reason for call:  Tammy from  [employer] received fax stating questions 19 20 21  and 22 the length of need and 30 where not filled out, faxing to [phone] Attention [facility] as per representative please indicate Dr. Jeanella Cara near the necessary changes.

## 2016-06-29 NOTE — Telephone Encounter (Signed)
Left message on Tammy's vm to give the office a call back. LB

## 2016-06-30 NOTE — Telephone Encounter (Signed)
Spoke with Tammy from [place]. Advised Tammy to refax paperwork with missing information. Tammy state she will refax paperwork. Awaiting paperwork via fax, from Dane from [place]. LB

## 2016-07-06 NOTE — Telephone Encounter (Addendum)
Form completed and faxed back. Fax confirmation received.  Forms numbered and placed in scan bin for scanning.

## 2016-07-16 ENCOUNTER — Other Ambulatory Visit: Payer: Self-pay | Admitting: Family Medicine

## 2016-07-16 DIAGNOSIS — E039 Hypothyroidism, unspecified: Secondary | ICD-10-CM

## 2016-07-20 ENCOUNTER — Telehealth: Payer: Self-pay | Admitting: Family Medicine

## 2016-07-20 ENCOUNTER — Ambulatory Visit (INDEPENDENT_AMBULATORY_CARE_PROVIDER_SITE_OTHER): Payer: Medicare Other | Admitting: Family Medicine

## 2016-07-20 ENCOUNTER — Ambulatory Visit (HOSPITAL_BASED_OUTPATIENT_CLINIC_OR_DEPARTMENT_OTHER)
Admission: RE | Admit: 2016-07-20 | Discharge: 2016-07-20 | Disposition: A | Discharge status: Home / Self Care | Payer: Medicare Other | Source: Ambulatory Visit | Attending: Family Medicine | Admitting: Family Medicine

## 2016-07-20 ENCOUNTER — Encounter: Payer: Self-pay | Admitting: Family Medicine

## 2016-07-20 VITALS — BP 136/80 | HR 75 | Temp 97.7°F | Resp 16 | Ht 61.0 in | Wt 285.2 lb

## 2016-07-20 DIAGNOSIS — M7989 Other specified soft tissue disorders: Secondary | ICD-10-CM | POA: Diagnosis not present

## 2016-07-20 DIAGNOSIS — E785 Hyperlipidemia, unspecified: Secondary | ICD-10-CM | POA: Diagnosis not present

## 2016-07-20 DIAGNOSIS — E039 Hypothyroidism, unspecified: Secondary | ICD-10-CM

## 2016-07-20 DIAGNOSIS — M79641 Pain in right hand: Secondary | ICD-10-CM

## 2016-07-20 DIAGNOSIS — I10 Essential (primary) hypertension: Secondary | ICD-10-CM

## 2016-07-20 DIAGNOSIS — M79661 Pain in right lower leg: Secondary | ICD-10-CM | POA: Insufficient documentation

## 2016-07-20 MED ORDER — HYDROCODONE-ACETAMINOPHEN 5-325 MG PO TABS: 1.0000 | ORAL_TABLET | ORAL | 0 refills | Status: DC | PRN
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
## 2016-07-20 NOTE — Telephone Encounter (Signed)
Patient called stating that she did have the imaging done on her hands today but she had to reschedule the procedure for her legs as it was going to take too long. She has the leg procedure scheduled for Friday at 1:30pm. FYI

## 2016-07-20 NOTE — Assessment & Plan Note (Signed)
Stable con't meds 

## 2016-07-20 NOTE — Telephone Encounter (Signed)
ok 

## 2016-07-20 NOTE — Patient Instructions (Signed)
Baker Cyst A Baker cyst, also called a popliteal cyst, is a sac-like growth that forms at the back of the knee. The cyst forms when the fluid-filled sac (bursa) that cushions the knee joint becomes enlarged. The bursa that becomes a Baker cyst is located at the back of the knee joint. What are the causes? In most cases, a Baker cyst results from another knee problem that causes swelling inside the knee. This makes the fluid inside the knee joint (synovial fluid) flow into the bursa behind the knee, causing the bursa to enlarge. What increases the risk? You may be more likely to develop a Baker cyst if you already have a knee problem, such as:  A tear in cartilage that cushions the knee joint (meniscal tear).  A tear in the tissues that connect the bones of the knee joint (ligament tear).  Knee swelling from osteoarthritis, rheumatoid arthritis, or gout. What are the signs or symptoms? A Baker cyst does not always cause symptoms. A lump behind the knee may be the only sign of the condition. The lump may be painful, especially when the knee is straightened. If the lump is painful, the pain may come and go. The knee may also be stiff. Symptoms may quickly get more severe if the cyst breaks open (ruptures). If your cyst ruptures, signs and symptoms may affect the knee and the back of the lower leg (calf) and may include:  Sudden or worsening pain.  Swelling.  Bruising. How is this diagnosed? This condition may be diagnosed based on your symptoms and medical history. Your health care provider will also do a physical exam. This may include:  Feeling the cyst to check whether it is tender.  Checking your knee for signs of another knee condition that causes swelling. You may have imaging tests, such as:  X-rays.  MRI.  Ultrasound. How is this treated? A Baker cyst that is not painful may go away without treatment. If the cyst gets large or painful, it will likely get better if the  underlying knee problem is treated. Treatment for a Baker cyst may include:  Resting.  Keeping weight off of the knee. This means not leaning on the knee to support your body weight.  NSAIDs to reduce pain and swelling.  A procedure to drain the fluid from the cyst with a needle (aspiration). You may also get an injection of a medicine that reduces swelling (steroid).  Surgery. This may be needed if other treatments do not work. This usually involves correcting knee damage and removing the cyst. Follow these instructions at home:  Take over-the-counter and prescription medicines only as told by your health care provider.  Rest and return to your normal activities as told by your health care provider. Avoid activities that make pain or swelling worse. Ask your health care provider what activities are safe for you.  Keep all follow-up visits as told by your health care provider. This is important. Contact a health care provider if:  You have knee pain, stiffness, or swelling that does not get better. Get help right away if:  You have sudden or worsening pain and swelling in your calf area. This information is not intended to replace advice given to you by your health care provider. Make sure you discuss any questions you have with your health care provider. Document Released: 06/01/2005 Document Revised: 02/20/2016 Document Reviewed: 02/20/2016 Elsevier Interactive Patient Education  2017 [employer]  

## 2016-07-20 NOTE — Progress Notes (Signed)
Pre visit review using our clinic review tool, if applicable. No additional management support is needed unless otherwise documented below in the visit note. 

## 2016-07-20 NOTE — Assessment & Plan Note (Signed)
Doubt dvt ? Bakers cyst Consider ortho

## 2016-07-21 ENCOUNTER — Other Ambulatory Visit: Payer: Self-pay | Admitting: Family Medicine

## 2016-07-21 DIAGNOSIS — M7989 Other specified soft tissue disorders: Secondary | ICD-10-CM

## 2016-07-21 LAB — LIPID PANEL
CHOLESTEROL: 120 mg/dL (ref 0–200)
HDL: 36.1 mg/dL — AB (ref >39.00)
LDL Cholesterol: 62 mg/dL (ref 0–99)
NonHDL: 83.54
TRIGLYCERIDES: 109 mg/dL (ref 0.0–149.0)
Total CHOL/HDL Ratio: 3
VLDL: 21.8 mg/dL (ref 0.0–40.0)

## 2016-07-21 LAB — COMPREHENSIVE METABOLIC PANEL
ALBUMIN: 3.9 g/dL (ref 3.5–5.2)
ALK PHOS: 116 U/L (ref 39–117)
ALT: 21 U/L (ref 0–35)
AST: 18 U/L (ref 0–37)
BUN: 11 mg/dL (ref 6–23)
CO2: 30 mEq/L (ref 19–32)
Calcium: 9.3 mg/dL (ref 8.4–10.5)
Chloride: 107 mEq/L (ref 96–112)
Creatinine, Ser: 0.49 mg/dL (ref 0.40–1.20)
GFR: 133.74 mL/min (ref >60.00)
Glucose, Bld: 98 mg/dL (ref 70–99)
POTASSIUM: 5.1 meq/L (ref 3.5–5.1)
SODIUM: 142 meq/L (ref 135–145)
TOTAL PROTEIN: 7.3 g/dL (ref 6.0–8.3)
Total Bilirubin: 0.9 mg/dL (ref 0.2–1.2)

## 2016-07-21 LAB — CBC WITH DIFFERENTIAL/PLATELET
BASOS ABS: 0.1 10*3/uL (ref 0.0–0.1)
Basophils Relative: 0.7 % (ref 0.0–3.0)
EOS ABS: 0.1 10*3/uL (ref 0.0–0.7)
Eosinophils Relative: 1.3 % (ref 0.0–5.0)
HCT: 37.8 % (ref 36.0–46.0)
HEMOGLOBIN: 11.9 g/dL — AB (ref 12.0–15.0)
Lymphocytes Relative: 24.4 % (ref 12.0–46.0)
Lymphs Abs: 2.3 10*3/uL (ref 0.7–4.0)
MCHC: 31.5 g/dL (ref 30.0–36.0)
MCV: 87.6 fl (ref 78.0–100.0)
MONO ABS: 0.5 10*3/uL (ref 0.1–1.0)
Monocytes Relative: 5 % (ref 3.0–12.0)
Neutro Abs: 6.4 10*3/uL (ref 1.4–7.7)
Neutrophils Relative %: 68.6 % (ref 43.0–77.0)
Platelets: 224 10*3/uL (ref 150.0–400.0)
RBC: 4.32 Mil/uL (ref 3.87–5.11)
RDW: 16.9 % — ABNORMAL HIGH (ref 11.5–15.5)
WBC: 9.3 10*3/uL (ref 4.0–10.5)

## 2016-07-21 LAB — TSH: TSH: 3.5 u[IU]/mL (ref 0.35–4.50)

## 2016-07-21 MED ORDER — PREDNISONE 10 MG PO TABS: ORAL_TABLET | ORAL | 0 refills | Status: DC

## 2016-07-23 ENCOUNTER — Telehealth: Payer: Self-pay | Admitting: Family Medicine

## 2016-07-23 DIAGNOSIS — M79672 Pain in left foot: Secondary | ICD-10-CM

## 2016-07-23 NOTE — Telephone Encounter (Signed)
Relation to [id number] Call back number: [phone]   Reason for call:  Patient requesting a referral to [facility] Dr. Marchia Bond MD due to reoccurring left foot pain not improving ask for [facility] (referral coordinator)

## 2016-07-23 NOTE — Telephone Encounter (Signed)
Patient notified. Referral placed

## 2016-07-24 ENCOUNTER — Ambulatory Visit (HOSPITAL_BASED_OUTPATIENT_CLINIC_OR_DEPARTMENT_OTHER)
Admission: RE | Admit: 2016-07-24 | Discharge: 2016-07-24 | Disposition: A | Discharge status: Home / Self Care | Payer: Medicare Other | Source: Ambulatory Visit | Attending: Family Medicine | Admitting: Family Medicine

## 2016-07-24 DIAGNOSIS — M79661 Pain in right lower leg: Secondary | ICD-10-CM | POA: Diagnosis not present

## 2016-07-24 DIAGNOSIS — M79605 Pain in left leg: Secondary | ICD-10-CM | POA: Diagnosis not present

## 2016-07-24 DIAGNOSIS — M7989 Other specified soft tissue disorders: Secondary | ICD-10-CM | POA: Diagnosis not present

## 2016-07-24 DIAGNOSIS — M79604 Pain in right leg: Secondary | ICD-10-CM | POA: Diagnosis not present

## 2016-07-29 DIAGNOSIS — S92345D Nondisplaced fracture of fourth metatarsal bone, left foot, subsequent encounter for fracture with routine healing: Secondary | ICD-10-CM | POA: Diagnosis not present

## 2016-08-04 ENCOUNTER — Encounter: Payer: Medicare Other | Admitting: Family Medicine

## 2016-08-06 ENCOUNTER — Ambulatory Visit (INDEPENDENT_AMBULATORY_CARE_PROVIDER_SITE_OTHER): Payer: Medicare Other | Admitting: Family Medicine

## 2016-08-06 ENCOUNTER — Encounter: Payer: Self-pay | Admitting: Family Medicine

## 2016-08-06 VITALS — BP 126/78 | HR 81 | Temp 98.4°F | Resp 16 | Ht 61.0 in | Wt 282.6 lb

## 2016-08-06 DIAGNOSIS — R319 Hematuria, unspecified: Secondary | ICD-10-CM

## 2016-08-06 DIAGNOSIS — Z0000 Encounter for general adult medical examination without abnormal findings: Secondary | ICD-10-CM | POA: Diagnosis not present

## 2016-08-06 DIAGNOSIS — Z23 Encounter for immunization: Secondary | ICD-10-CM | POA: Diagnosis not present

## 2016-08-06 DIAGNOSIS — I10 Essential (primary) hypertension: Secondary | ICD-10-CM

## 2016-08-06 DIAGNOSIS — E785 Hyperlipidemia, unspecified: Secondary | ICD-10-CM | POA: Diagnosis not present

## 2016-08-06 DIAGNOSIS — E039 Hypothyroidism, unspecified: Secondary | ICD-10-CM

## 2016-08-06 LAB — POC URINALSYSI DIPSTICK (AUTOMATED)
Bilirubin, UA: NEGATIVE
GLUCOSE UA: NEGATIVE
Ketones, UA: NEGATIVE
Leukocytes, UA: NEGATIVE
NITRITE UA: NEGATIVE
PROTEIN UA: NEGATIVE
SPEC GRAV UA: 1.025
UROBILINOGEN UA: 0.2
pH, UA: 6

## 2016-08-06 MED ORDER — ZOSTER VAC RECOMB ADJUVANTED 50 MCG/0.5ML IM SUSR: 1.0000 mL | Freq: Once | INTRAMUSCULAR | 1 refills | Status: AC

## 2016-08-06 MED ORDER — ATORVASTATIN CALCIUM 20 MG PO TABS: 20.0000 mg | ORAL_TABLET | Freq: Every day | ORAL | 1 refills | Status: DC

## 2016-08-06 NOTE — Patient Instructions (Signed)
Preventive Care 65 Years and Older, Female Preventive care refers to lifestyle choices and visits with your health care provider that can promote health and wellness. What does preventive care include?  A yearly physical exam. This is also called an annual well check.  Dental exams once or twice a year.  Routine eye exams. Ask your health care provider how often you should have your eyes checked.  Personal lifestyle choices, including:  Daily care of your teeth and gums.  Regular physical activity.  Eating a healthy diet.  Avoiding tobacco and drug use.  Limiting alcohol use.  Practicing safe sex.  Taking low-dose aspirin every day.  Taking vitamin and mineral supplements as recommended by your health care provider. What happens during an annual well check? The services and screenings done by your health care provider during your annual well check will depend on your age, overall health, lifestyle risk factors, and family history of disease. Counseling  Your health care provider may ask you questions about your:  Alcohol use.  Tobacco use.  Drug use.  Emotional well-being.  Home and relationship well-being.  Sexual activity.  Eating habits.  History of falls.  Memory and ability to understand (cognition).  Work and work environment.  Reproductive health. Screening  You may have the following tests or measurements:  Height, weight, and BMI.  Blood pressure.  Lipid and cholesterol levels. These may be checked every 5 years, or more frequently if you are over 50 years old.  Skin check.  Lung cancer screening. You may have this screening every year starting at age 55 if you have a 30-pack-year history of smoking and currently smoke or have quit within the past 15 years.  Fecal occult blood test (FOBT) of the stool. You may have this test every year starting at age 50.  Flexible sigmoidoscopy or colonoscopy. You may have a sigmoidoscopy every 5 years or  a colonoscopy every 10 years starting at age 50.  Hepatitis C blood test.  Hepatitis B blood test.  Sexually transmitted disease (STD) testing.  Diabetes screening. This is done by checking your blood sugar (glucose) after you have not eaten for a while (fasting). You may have this done every 1-3 years.  Bone density scan. This is done to screen for osteoporosis. You may have this done starting at age 65.  Mammogram. This may be done every 1-2 years. Talk to your health care provider about how often you should have regular mammograms. Talk with your health care provider about your test results, treatment options, and if necessary, the need for more tests. Vaccines  Your health care provider may recommend certain vaccines, such as:  Influenza vaccine. This is recommended every year.  Tetanus, diphtheria, and acellular pertussis (Tdap, Td) vaccine. You may need a Td booster every 10 years.  Varicella vaccine. You may need this if you have not been vaccinated.  Zoster vaccine. You may need this after age 60.  Measles, mumps, and rubella (MMR) vaccine. You may need at least one dose of MMR if you were born in 1957 or later. You may also need a second dose.  Pneumococcal 13-valent conjugate (PCV13) vaccine. One dose is recommended after age 65.  Pneumococcal polysaccharide (PPSV23) vaccine. One dose is recommended after age 65.  Meningococcal vaccine. You may need this if you have certain conditions.  Hepatitis A vaccine. You may need this if you have certain conditions or if you travel or work in places where you may be exposed to   hepatitis A.  Hepatitis B vaccine. You may need this if you have certain conditions or if you travel or work in places where you may be exposed to hepatitis B.  Haemophilus influenzae type b (Hib) vaccine. You may need this if you have certain conditions. Talk to your health care provider about which screenings and vaccines you need and how often you need  them. This information is not intended to replace advice given to you by your health care provider. Make sure you discuss any questions you have with your health care provider. Document Released: 06/28/2015 Document Revised: 02/19/2016 Document Reviewed: 04/02/2015 Elsevier Interactive Patient Education  2017 [employer].  
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
## 2016-08-07 LAB — URINE CULTURE: ORGANISM ID, BACTERIA: NO GROWTH

## 2016-08-09 ENCOUNTER — Encounter: Payer: Self-pay | Admitting: Family Medicine

## 2016-08-09 MED ORDER — FUROSEMIDE 40 MG PO TABS: 40.0000 mg | ORAL_TABLET | Freq: Every day | ORAL | 3 refills | Status: DC

## 2016-08-09 MED ORDER — BENAZEPRIL HCL 10 MG PO TABS: ORAL_TABLET | ORAL | 3 refills | Status: DC

## 2016-08-09 MED ORDER — SYNTHROID 75 MCG PO TABS: ORAL_TABLET | ORAL | 0 refills | Status: DC

## 2016-08-09 MED ORDER — ATORVASTATIN CALCIUM 20 MG PO TABS: 20.0000 mg | ORAL_TABLET | Freq: Every day | ORAL | 1 refills | Status: DC

## 2016-10-04 ENCOUNTER — Other Ambulatory Visit: Payer: Self-pay | Admitting: Family Medicine

## 2016-10-04 DIAGNOSIS — I10 Essential (primary) hypertension: Secondary | ICD-10-CM

## 2016-10-16 ENCOUNTER — Ambulatory Visit (INDEPENDENT_AMBULATORY_CARE_PROVIDER_SITE_OTHER): Payer: Medicare Other | Admitting: Family Medicine

## 2016-10-16 ENCOUNTER — Encounter: Payer: Self-pay | Admitting: Family Medicine

## 2016-10-16 VITALS — BP 131/87 | HR 82 | Temp 98.1°F | Resp 16 | Ht 61.0 in | Wt 282.2 lb

## 2016-10-16 DIAGNOSIS — I10 Essential (primary) hypertension: Secondary | ICD-10-CM

## 2016-10-16 DIAGNOSIS — R601 Generalized edema: Secondary | ICD-10-CM | POA: Diagnosis not present

## 2016-10-16 DIAGNOSIS — E785 Hyperlipidemia, unspecified: Secondary | ICD-10-CM

## 2016-10-16 DIAGNOSIS — I83813 Varicose veins of bilateral lower extremities with pain: Secondary | ICD-10-CM

## 2016-10-16 DIAGNOSIS — I83893 Varicose veins of bilateral lower extremities with other complications: Secondary | ICD-10-CM | POA: Diagnosis not present

## 2016-10-16 MED ORDER — NONFORMULARY OR COMPOUNDED ITEM: 1 refills | Status: DC

## 2016-10-16 NOTE — Patient Instructions (Signed)
Edema Edema is an abnormal buildup of fluids in your bodytissues. Edema is somewhatdependent on gravity to pull the fluid to the lowest place in your body. That makes the condition more common in the legs and thighs (lower extremities). Painless swelling of the feet and ankles is common and becomes more likely as you get older. It is also common in looser tissues, like around your eyes. When the affected area is squeezed, the fluid may move out of that spot and leave a dent for a few moments. This dent is called pitting. What are the causes? There are many possible causes of edema. Eating too much salt and being on your feet or sitting for a long time can cause edema in your legs and ankles. Hot weather may make edema worse. Common medical causes of edema include:  Heart failure.  Liver disease.  Kidney disease.  Weak blood vessels in your legs.  Cancer.  An injury.  Pregnancy.  Some medications.  Obesity.  What are the signs or symptoms? Edema is usually painless.Your skin may look swollen or shiny. How is this diagnosed? Your health care provider may be able to diagnose edema by asking about your medical history and doing a physical exam. You may need to have tests such as X-rays, an electrocardiogram, or blood tests to check for medical conditions that may cause edema. How is this treated? Edema treatment depends on the cause. If you have heart, liver, or kidney disease, you need the treatment appropriate for these conditions. General treatment may include:  Elevation of the affected body part above the level of your heart.  Compression of the affected body part. Pressure from elastic bandages or support stockings squeezes the tissues and forces fluid back into the blood vessels. This keeps fluid from entering the tissues.  Restriction of fluid and salt intake.  Use of a water pill (diuretic). These medications are appropriate only for some types of edema. They pull fluid  out of your body and make you urinate more often. This gets rid of fluid and reduces swelling, but diuretics can have side effects. Only use diuretics as directed by your health care provider.  Follow these instructions at home:  Keep the affected body part above the level of your heart when you are lying down.  Do not sit still or stand for prolonged periods.  Do not put anything directly under your knees when lying down.  Do not wear constricting clothing or garters on your upper legs.  Exercise your legs to work the fluid back into your blood vessels. This may help the swelling go down.  Wear elastic bandages or support stockings to reduce ankle swelling as directed by your health care provider.  Eat a low-salt diet to reduce fluid if your health care provider recommends it.  Only take medicines as directed by your health care provider. Contact a health care provider if:  Your edema is not responding to treatment.  You have heart, liver, or kidney disease and notice symptoms of edema.  You have edema in your legs that does not improve after elevating them.  You have sudden and unexplained weight gain. Get help right away if:  You develop shortness of breath or chest pain.  You cannot breathe when you lie down.  You develop pain, redness, or warmth in the swollen areas.  You have heart, liver, or kidney disease and suddenly get edema.  You have a fever and your symptoms suddenly get worse. This information is   not intended to replace advice given to you by your health care provider. Make sure you discuss any questions you have with your health care provider. Document Released: 06/01/2005 Document Revised: 11/07/2015 Document Reviewed: 03/24/2013 Elsevier Interactive Patient Education  2017 [employer]  

## 2016-10-16 NOTE — Progress Notes (Signed)
Pre visit review using our clinic review tool, if applicable. No additional management support is needed unless otherwise documented below in the visit note. 
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
## 2016-10-16 NOTE — Assessment & Plan Note (Signed)
Elevate legs Compression socks Inc lasix 40 mg 2 po qd x 3 days  Refer to vascular

## 2016-10-17 ENCOUNTER — Other Ambulatory Visit: Payer: Self-pay | Admitting: Family Medicine

## 2016-10-17 DIAGNOSIS — E039 Hypothyroidism, unspecified: Secondary | ICD-10-CM

## 2016-10-19 ENCOUNTER — Ambulatory Visit: Payer: Medicare Other | Admitting: Family Medicine

## 2016-11-03 ENCOUNTER — Other Ambulatory Visit: Payer: Self-pay | Admitting: Family Medicine

## 2016-11-03 DIAGNOSIS — Z1231 Encounter for screening mammogram for malignant neoplasm of breast: Secondary | ICD-10-CM

## 2016-11-28 ENCOUNTER — Emergency Department (HOSPITAL_COMMUNITY)
Admission: EM | Admit: 2016-11-28 | Discharge: 2016-11-28 | Disposition: A | Discharge status: Home / Self Care | Payer: Medicare Other | Attending: Emergency Medicine | Admitting: Emergency Medicine

## 2016-11-28 ENCOUNTER — Encounter (HOSPITAL_COMMUNITY): Payer: Self-pay | Admitting: Emergency Medicine

## 2016-11-28 ENCOUNTER — Emergency Department (HOSPITAL_COMMUNITY): Payer: Medicare Other

## 2016-11-28 DIAGNOSIS — N939 Abnormal uterine and vaginal bleeding, unspecified: Secondary | ICD-10-CM | POA: Diagnosis present

## 2016-11-28 DIAGNOSIS — N950 Postmenopausal bleeding: Secondary | ICD-10-CM | POA: Diagnosis not present

## 2016-11-28 DIAGNOSIS — N938 Other specified abnormal uterine and vaginal bleeding: Secondary | ICD-10-CM | POA: Diagnosis not present

## 2016-11-28 DIAGNOSIS — I10 Essential (primary) hypertension: Secondary | ICD-10-CM | POA: Insufficient documentation

## 2016-11-28 DIAGNOSIS — E039 Hypothyroidism, unspecified: Secondary | ICD-10-CM | POA: Insufficient documentation

## 2016-11-28 DIAGNOSIS — G8190 Hemiplegia, unspecified affecting unspecified side: Secondary | ICD-10-CM | POA: Diagnosis not present

## 2016-11-28 DIAGNOSIS — R109 Unspecified abdominal pain: Secondary | ICD-10-CM | POA: Diagnosis not present

## 2016-11-28 DIAGNOSIS — Z87891 Personal history of nicotine dependence: Secondary | ICD-10-CM | POA: Diagnosis not present

## 2016-11-28 DIAGNOSIS — Z79899 Other long term (current) drug therapy: Secondary | ICD-10-CM | POA: Insufficient documentation

## 2016-11-28 LAB — URINALYSIS, ROUTINE W REFLEX MICROSCOPIC
BILIRUBIN URINE: NEGATIVE
Glucose, UA: NEGATIVE mg/dL
Ketones, ur: NEGATIVE mg/dL
Leukocytes, UA: NEGATIVE
Nitrite: NEGATIVE
Protein, ur: NEGATIVE mg/dL
pH: 6 (ref 5.0–8.0)

## 2016-11-28 LAB — COMPREHENSIVE METABOLIC PANEL
ALT: 25 U/L (ref 14–54)
AST: 27 U/L (ref 15–41)
Albumin: 3.7 g/dL (ref 3.5–5.0)
Alkaline Phosphatase: 111 U/L (ref 38–126)
Anion gap: 9 (ref 5–15)
BUN: 13 mg/dL (ref 6–20)
CHLORIDE: 107 mmol/L (ref 101–111)
CO2: 25 mmol/L (ref 22–32)
CREATININE: 0.46 mg/dL (ref 0.44–1.00)
Calcium: 8.5 mg/dL — ABNORMAL LOW (ref 8.9–10.3)
GFR calc Af Amer: >60 mL/min (ref >60)
GFR calc non Af Amer: >60 mL/min (ref >60)
GLUCOSE: 135 mg/dL — AB (ref 65–99)
Potassium: 3.5 mmol/L (ref 3.5–5.1)
SODIUM: 141 mmol/L (ref 135–145)
Total Bilirubin: 1 mg/dL (ref 0.3–1.2)
Total Protein: 7.4 g/dL (ref 6.5–8.1)

## 2016-11-28 LAB — CBC WITH DIFFERENTIAL/PLATELET
Basophils Absolute: 0 10*3/uL (ref 0.0–0.1)
Basophils Relative: 0 %
EOS ABS: 0.1 10*3/uL (ref 0.0–0.7)
EOS PCT: 1 %
HCT: 38.2 % (ref 36.0–46.0)
Hemoglobin: 12 g/dL (ref 12.0–15.0)
Lymphocytes Relative: 20 %
Lymphs Abs: 1.7 10*3/uL (ref 0.7–4.0)
MCH: 28 pg (ref 26.0–34.0)
MCHC: 31.4 g/dL (ref 30.0–36.0)
MCV: 89.3 fL (ref 78.0–100.0)
MONOS PCT: 4 %
Monocytes Absolute: 0.4 10*3/uL (ref 0.1–1.0)
Neutro Abs: 6.3 10*3/uL (ref 1.7–7.7)
Neutrophils Relative %: 75 %
Platelets: 205 10*3/uL (ref 150–400)
RBC: 4.28 MIL/uL (ref 3.87–5.11)
RDW: 15.7 % — ABNORMAL HIGH (ref 11.5–15.5)
WBC: 8.6 10*3/uL (ref 4.0–10.5)

## 2016-11-28 LAB — URINALYSIS, MICROSCOPIC (REFLEX)
BACTERIA UA: NONE SEEN
RBC / HPF: NONE SEEN RBC/hpf (ref 0–5)
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
## 2016-11-28 NOTE — ED Notes (Signed)
Bed: MB31 Expected date:  Expected time:  Means of arrival:  Comments: EMS-vag bleeding

## 2016-11-28 NOTE — ED Triage Notes (Signed)
Patient here from home with complaints of vaginal bleeding that started last night. Steady dark red blood. Lower abdominal cramping associated.

## 2016-11-30 ENCOUNTER — Ambulatory Visit (INDEPENDENT_AMBULATORY_CARE_PROVIDER_SITE_OTHER): Payer: Medicare Other | Admitting: Obstetrics & Gynecology

## 2016-11-30 ENCOUNTER — Encounter: Payer: Self-pay | Admitting: Obstetrics & Gynecology

## 2016-11-30 ENCOUNTER — Telehealth: Payer: Self-pay | Admitting: *Deleted

## 2016-11-30 VITALS — BP 149/67 | HR 101 | Ht 60.0 in | Wt 277.3 lb

## 2016-11-30 DIAGNOSIS — N950 Postmenopausal bleeding: Secondary | ICD-10-CM | POA: Diagnosis not present

## 2016-11-30 NOTE — Telephone Encounter (Signed)
Kelly [name] left a voicemail 11/29/14 am while office closed stating she hasn't been seen in 3 or 4 years but has started passing blood again and is scared since she is 67 years old.  I called Shantelle back and she states she called today and got an appt to be seen this afernoon.  Is on schedule for 2;40pm.

## 2016-11-30 NOTE — Patient Instructions (Signed)
Hysteroscopy  Hysteroscopy is a procedure used for looking inside the womb (uterus). It may be done for various reasons, including:  · To evaluate abnormal bleeding, fibroid (benign, noncancerous) tumors, polyps, scar tissue (adhesions), and possibly cancer of the uterus.  · To look for lumps (tumors) and other uterine growths.  · To look for causes of why a woman cannot get pregnant (infertility), causes of recurrent loss of pregnancy (miscarriages), or a lost intrauterine device (IUD).  · To perform a sterilization by blocking the fallopian tubes from inside the uterus.    In this procedure, a thin, flexible tube with a tiny light and camera on the end of it (hysteroscope) is used to look inside the uterus. A hysteroscopy should be done right after a menstrual period to be sure you are not pregnant.  LET YOUR HEALTH CARE PROVIDER KNOW ABOUT:  · Any allergies you have.  · All medicines you are taking, including vitamins, herbs, eye drops, creams, and over-the-counter medicines.  · Previous problems you or members of your family have had with the use of anesthetics.  · Any blood disorders you have.  · Previous surgeries you have had.  · Medical conditions you have.  RISKS AND COMPLICATIONS  Generally, this is a safe procedure. However, as with any procedure, complications can occur. Possible complications include:  · Putting a hole in the uterus.  · Excessive bleeding.  · Infection.  · Damage to the cervix.  · Injury to other organs.  · Allergic reaction to medicines.  · Too much fluid used in the uterus for the procedure.    BEFORE THE PROCEDURE  · Ask your health care provider about changing or stopping any regular medicines.  · Do not take aspirin or blood thinners for 1 week before the procedure, or as directed by your health care provider. These can cause bleeding.  · If you smoke, do not smoke for 2 weeks before the procedure.  · In some cases, a medicine is placed in the cervix the day before the procedure.  This medicine makes the cervix have a larger opening (dilate). This makes it easier for the instrument to be inserted into the uterus during the procedure.  · Do not eat or drink anything for at least 8 hours before the surgery.  · Arrange for someone to take you home after the procedure.  PROCEDURE  · You may be given a medicine to relax you (sedative). You may also be given one of the following:  ? A medicine that numbs the area around the cervix (local anesthetic).  ? A medicine that makes you sleep through the procedure (general anesthetic).  · The hysteroscope is inserted through the vagina into the uterus. The camera on the hysteroscope sends a picture to a TV screen. This gives the surgeon a good view inside the uterus.  · During the procedure, air or a liquid is put into the uterus, which allows the surgeon to see better.  · Sometimes, tissue is gently scraped from inside the uterus. These tissue samples are sent to a lab for testing.  What to expect after the procedure  · If you had a general anesthetic, you may be groggy for a couple hours after the procedure.  · If you had a local anesthetic, you will be able to go home as soon as you are stable and feel ready.  · You may have some cramping. This normally lasts for a couple days.  · You may   have bleeding, which varies from light spotting for a few days to menstrual-like bleeding for 3-7 days. This is normal.  · If your test results are not back during the visit, make an appointment with your health care provider to find out the results.  This information is not intended to replace advice given to you by your health care provider. Make sure you discuss any questions you have with your health care provider.  Document Released: 09/07/2000 Document Revised: 11/07/2015 Document Reviewed: 12/29/2012  Elsevier Interactive Patient Education © 2017 [employer]
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
## 2016-12-01 ENCOUNTER — Encounter (HOSPITAL_COMMUNITY): Payer: Self-pay

## 2016-12-03 ENCOUNTER — Encounter: Payer: Self-pay | Admitting: Vascular Surgery

## 2016-12-03 ENCOUNTER — Other Ambulatory Visit: Payer: Self-pay | Admitting: Family Medicine

## 2016-12-03 DIAGNOSIS — M792 Neuralgia and neuritis, unspecified: Secondary | ICD-10-CM

## 2016-12-03 NOTE — Telephone Encounter (Signed)
Last OV 10/16/2016 Last refill #90 with 3 refills on 11/25/2015

## 2016-12-04 ENCOUNTER — Telehealth: Payer: Self-pay | Admitting: *Deleted

## 2016-12-04 ENCOUNTER — Other Ambulatory Visit: Payer: Self-pay | Admitting: *Deleted

## 2016-12-04 MED ORDER — MEGESTROL ACETATE 40 MG PO TABS: 40.0000 mg | ORAL_TABLET | Freq: Two times a day (BID) | ORAL | 1 refills | Status: DC

## 2016-12-04 NOTE — Telephone Encounter (Signed)
After hours nurse called Dr. Kennon Rounds.  Medication ordered and transmitted to pharmacy.

## 2016-12-04 NOTE — Telephone Encounter (Signed)
Patient left message. Saw Dr Roselie Awkward in clinic on Monday. He was supposed to call in med to stop her bleeding. Please return call.

## 2016-12-07 ENCOUNTER — Other Ambulatory Visit: Payer: Self-pay | Admitting: Obstetrics & Gynecology

## 2016-12-14 ENCOUNTER — Encounter: Payer: Self-pay | Admitting: Vascular Surgery

## 2016-12-14 ENCOUNTER — Ambulatory Visit (INDEPENDENT_AMBULATORY_CARE_PROVIDER_SITE_OTHER): Payer: Medicare Other | Admitting: Vascular Surgery

## 2016-12-14 VITALS — BP 109/69 | HR 72 | Temp 97.0°F | Resp 18 | Ht 60.0 in | Wt 277.0 lb

## 2016-12-14 DIAGNOSIS — R600 Localized edema: Secondary | ICD-10-CM
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
## 2016-12-15 ENCOUNTER — Ambulatory Visit
Admission: RE | Admit: 2016-12-15 | Discharge: 2016-12-15 | Disposition: A | Discharge status: Home / Self Care | Payer: Medicare Other | Source: Ambulatory Visit | Attending: Family Medicine | Admitting: Family Medicine

## 2016-12-15 DIAGNOSIS — Z1231 Encounter for screening mammogram for malignant neoplasm of breast: Secondary | ICD-10-CM

## 2017-01-04 NOTE — Patient Instructions (Signed)
Your procedure is scheduled on:  Thursday, Jan 14, 2017  Enter through the [employer] [facility] at:  2:00 PM  Pick up the phone at the desk and dial [phone].  Call this number if you have problems the morning of surgery: [phone].  Remember: Do NOT eat food:  After Midnight Wednesday  Do NOT drink clear liquids after:  9:30 AM Thursday  Take these medicines the morning of surgery with a SIP OF WATER:  Atorvastatin, Benazepril, Gabapentin,Lasix, Potassium, Synthroid  Stop ALL herbal medications and fish oil at this time  Do NOT smoke the day of surgery.  Do NOT wear jewelry (body piercing), metal hair clips/bobby pins, make-up, artifical eyelashes or nail polish. Do NOT wear lotions, powders, or perfumes.  You may wear deodorant. Do NOT shave for 48 hours prior to surgery. Do NOT bring valuables to the hospital. Contacts, dentures, or bridgework may not be worn into surgery.  Have a responsible adult drive you home and stay with you for 24 hours after your procedure  Bring a copy of your healthcare power of attorney and living will documents.

## 2017-01-05 ENCOUNTER — Encounter (HOSPITAL_COMMUNITY)
Admission: RE | Admit: 2017-01-05 | Discharge: 2017-01-05 | Disposition: A | Discharge status: Home / Self Care | Payer: Medicare Other | Source: Ambulatory Visit | Attending: Obstetrics & Gynecology | Admitting: Obstetrics & Gynecology

## 2017-01-05 ENCOUNTER — Other Ambulatory Visit: Payer: Self-pay

## 2017-01-05 ENCOUNTER — Encounter (HOSPITAL_COMMUNITY): Payer: Self-pay

## 2017-01-05 DIAGNOSIS — Z01812 Encounter for preprocedural laboratory examination: Secondary | ICD-10-CM | POA: Insufficient documentation

## 2017-01-05 DIAGNOSIS — Z01810 Encounter for preprocedural cardiovascular examination: Secondary | ICD-10-CM | POA: Insufficient documentation

## 2017-01-05 DIAGNOSIS — I4510 Unspecified right bundle-branch block: Secondary | ICD-10-CM | POA: Insufficient documentation

## 2017-01-05 HISTORY — DX: Personal history of other diseases of the digestive system: Z87.19

## 2017-01-05 HISTORY — DX: Hypothyroidism, unspecified: E03.9

## 2017-01-05 LAB — BASIC METABOLIC PANEL
Anion gap: 7 (ref 5–15)
BUN: 14 mg/dL (ref 6–20)
CALCIUM: 9.3 mg/dL (ref 8.9–10.3)
CHLORIDE: 108 mmol/L (ref 101–111)
CO2: 26 mmol/L (ref 22–32)
CREATININE: 0.52 mg/dL (ref 0.44–1.00)
GFR calc non Af Amer: >60 mL/min (ref >60)
GLUCOSE: 112 mg/dL — AB (ref 65–99)
Potassium: 3.9 mmol/L (ref 3.5–5.1)
Sodium: 141 mmol/L (ref 135–145)

## 2017-01-05 LAB — CBC
HEMATOCRIT: 38 % (ref 36.0–46.0)
HEMOGLOBIN: 11.7 g/dL — AB (ref 12.0–15.0)
MCH: 27.5 pg (ref 26.0–34.0)
MCHC: 30.8 g/dL (ref 30.0–36.0)
MCV: 89.4 fL (ref 78.0–100.0)
Platelets: 235 10*3/uL (ref 150–400)
RBC: 4.25 MIL/uL (ref 3.87–5.11)
RDW: 15.8 % — AB (ref 11.5–15.5)
WBC: 11.2 10*3/uL — ABNORMAL HIGH (ref 4.0–10.5)

## 2017-01-05 LAB — TYPE AND SCREEN
ABO/RH(D): A POS
ANTIBODY SCREEN: NEGATIVE

## 2017-01-05 LAB — ABO/RH: ABO/RH(D): A POS

## 2017-01-05 NOTE — Pre-Procedure Instructions (Signed)
Dr. Jillyn Hidden reviewed EKG and medical history, no new orders received at this time.

## 2017-01-14 ENCOUNTER — Encounter (HOSPITAL_COMMUNITY): Payer: Self-pay | Admitting: Anesthesiology

## 2017-01-14 ENCOUNTER — Encounter (HOSPITAL_COMMUNITY)
Admission: RE | Disposition: A | Discharge status: Home / Self Care | Payer: Self-pay | Source: Ambulatory Visit | Attending: Obstetrics & Gynecology

## 2017-01-14 ENCOUNTER — Ambulatory Visit (HOSPITAL_COMMUNITY): Payer: Medicare Other | Admitting: Anesthesiology

## 2017-01-14 ENCOUNTER — Ambulatory Visit (HOSPITAL_COMMUNITY)
Admission: RE | Admit: 2017-01-14 | Discharge: 2017-01-14 | Disposition: A | Discharge status: Home / Self Care | Payer: Medicare Other | Source: Ambulatory Visit | Attending: Obstetrics & Gynecology | Admitting: Obstetrics & Gynecology

## 2017-01-14 DIAGNOSIS — R938 Abnormal findings on diagnostic imaging of other specified body structures: Secondary | ICD-10-CM | POA: Insufficient documentation

## 2017-01-14 DIAGNOSIS — I10 Essential (primary) hypertension: Secondary | ICD-10-CM | POA: Insufficient documentation

## 2017-01-14 DIAGNOSIS — Z6841 Body Mass Index (BMI) 40.0 and over, adult: Secondary | ICD-10-CM | POA: Insufficient documentation

## 2017-01-14 DIAGNOSIS — N950 Postmenopausal bleeding: Secondary | ICD-10-CM | POA: Diagnosis not present

## 2017-01-14 DIAGNOSIS — K635 Polyp of colon: Secondary | ICD-10-CM | POA: Diagnosis not present

## 2017-01-14 DIAGNOSIS — Z7982 Long term (current) use of aspirin: Secondary | ICD-10-CM | POA: Insufficient documentation

## 2017-01-14 DIAGNOSIS — N840 Polyp of corpus uteri: Secondary | ICD-10-CM | POA: Diagnosis not present

## 2017-01-14 DIAGNOSIS — Z79899 Other long term (current) drug therapy: Secondary | ICD-10-CM | POA: Insufficient documentation

## 2017-01-14 DIAGNOSIS — Z853 Personal history of malignant neoplasm of breast: Secondary | ICD-10-CM | POA: Diagnosis not present

## 2017-01-14 DIAGNOSIS — E785 Hyperlipidemia, unspecified: Secondary | ICD-10-CM | POA: Diagnosis not present

## 2017-01-14 DIAGNOSIS — Z96653 Presence of artificial knee joint, bilateral: Secondary | ICD-10-CM | POA: Diagnosis not present

## 2017-01-14 DIAGNOSIS — E079 Disorder of thyroid, unspecified: Secondary | ICD-10-CM | POA: Insufficient documentation

## 2017-01-14 DIAGNOSIS — Z87891 Personal history of nicotine dependence: Secondary | ICD-10-CM | POA: Insufficient documentation

## 2017-01-14 DIAGNOSIS — N841 Polyp of cervix uteri: Secondary | ICD-10-CM | POA: Diagnosis not present

## 2017-01-14 DIAGNOSIS — Z86711 Personal history of pulmonary embolism: Secondary | ICD-10-CM | POA: Insufficient documentation

## 2017-01-14 DIAGNOSIS — D252 Subserosal leiomyoma of uterus: Secondary | ICD-10-CM | POA: Insufficient documentation

## 2017-01-14 HISTORY — PX: HYSTEROSCOPY WITH D & C: SHX1775

## 2017-01-14 SURGERY — DILATATION AND CURETTAGE /HYSTEROSCOPY
Anesthesia: General | Site: Vagina

## 2017-01-14 MED ORDER — KETOROLAC TROMETHAMINE 30 MG/ML IJ SOLN
INTRAMUSCULAR | Status: AC
  Filled 2017-01-14: qty 1

## 2017-01-14 MED ORDER — DIPHENHYDRAMINE HCL 50 MG/ML IJ SOLN
INTRAMUSCULAR | Status: DC | PRN
  Administered 2017-01-14: 12.5 mg via INTRAVENOUS

## 2017-01-14 MED ORDER — ONDANSETRON HCL 4 MG/2ML IJ SOLN
INTRAMUSCULAR | Status: DC | PRN
  Administered 2017-01-14: 4 mg via INTRAVENOUS

## 2017-01-14 MED ORDER — FENTANYL CITRATE (PF) 100 MCG/2ML IJ SOLN
INTRAMUSCULAR | Status: AC
  Filled 2017-01-14: qty 2

## 2017-01-14 MED ORDER — SODIUM CHLORIDE 0.9 % IR SOLN
Status: DC | PRN
  Administered 2017-01-14: 3000 mL

## 2017-01-14 MED ORDER — PROPOFOL 10 MG/ML IV BOLUS
INTRAVENOUS | Status: AC
  Filled 2017-01-14: qty 20

## 2017-01-14 MED ORDER — PROPOFOL 10 MG/ML IV BOLUS
INTRAVENOUS | Status: DC | PRN
  Administered 2017-01-14: 200 mg via INTRAVENOUS

## 2017-01-14 MED ORDER — BUPIVACAINE-EPINEPHRINE 0.5% -1:200000 IJ SOLN
INTRAMUSCULAR | Status: DC | PRN
  Administered 2017-01-14: 6 mL

## 2017-01-14 MED ORDER — DEXAMETHASONE SODIUM PHOSPHATE 4 MG/ML IJ SOLN
INTRAMUSCULAR | Status: AC
  Filled 2017-01-14: qty 1

## 2017-01-14 MED ORDER — ONDANSETRON HCL 4 MG/2ML IJ SOLN
INTRAMUSCULAR | Status: AC
  Filled 2017-01-14: qty 2

## 2017-01-14 MED ORDER — LACTATED RINGERS IV SOLN
INTRAVENOUS | Status: DC
  Administered 2017-01-14: 12:00:00 via INTRAVENOUS

## 2017-01-14 MED ORDER — METOCLOPRAMIDE HCL 5 MG/ML IJ SOLN: 10.0000 mg | Freq: Once | INTRAMUSCULAR | Status: DC | PRN

## 2017-01-14 MED ORDER — BUPIVACAINE HCL (PF) 0.5 % IJ SOLN
INTRAMUSCULAR | Status: AC
  Filled 2017-01-14: qty 30

## 2017-01-14 MED ORDER — DEXAMETHASONE SODIUM PHOSPHATE 10 MG/ML IJ SOLN
INTRAMUSCULAR | Status: DC | PRN
  Administered 2017-01-14: 4 mg via INTRAVENOUS

## 2017-01-14 MED ORDER — LACTATED RINGERS IV SOLN: INTRAVENOUS | Status: DC

## 2017-01-14 MED ORDER — FENTANYL CITRATE (PF) 100 MCG/2ML IJ SOLN
INTRAMUSCULAR | Status: DC | PRN
  Administered 2017-01-14 (×2): 50 ug via INTRAVENOUS

## 2017-01-14 MED ORDER — DIPHENHYDRAMINE HCL 50 MG/ML IJ SOLN
INTRAMUSCULAR | Status: AC
  Filled 2017-01-14: qty 1

## 2017-01-14 MED ORDER — KETOROLAC TROMETHAMINE 30 MG/ML IJ SOLN
INTRAMUSCULAR | Status: DC | PRN
  Administered 2017-01-14: 15 mg via INTRAVENOUS

## 2017-01-14 MED ORDER — SODIUM CHLORIDE 0.9 % IJ SOLN
INTRAMUSCULAR | Status: AC
  Filled 2017-01-14: qty 10

## 2017-01-14 MED ORDER — MIDAZOLAM HCL 2 MG/2ML IJ SOLN
INTRAMUSCULAR | Status: AC
  Filled 2017-01-14: qty 2

## 2017-01-14 MED ORDER — BUPIVACAINE-EPINEPHRINE (PF) 0.5% -1:200000 IJ SOLN
INTRAMUSCULAR | Status: AC
  Filled 2017-01-14: qty 30

## 2017-01-14 MED ORDER — SCOPOLAMINE 1 MG/3DAYS TD PT72: 1.0000 | MEDICATED_PATCH | Freq: Once | TRANSDERMAL | Status: DC

## 2017-01-14 MED ORDER — FENTANYL CITRATE (PF) 100 MCG/2ML IJ SOLN: 25.0000 ug | INTRAMUSCULAR | Status: DC | PRN

## 2017-01-14 MED ORDER — LIDOCAINE HCL (CARDIAC) 20 MG/ML IV SOLN
INTRAVENOUS | Status: AC
  Filled 2017-01-14: qty 5

## 2017-01-14 MED ORDER — MEPERIDINE HCL 25 MG/ML IJ SOLN: 6.2500 mg | INTRAMUSCULAR | Status: DC | PRN

## 2017-01-14 MED ORDER — LIDOCAINE HCL (CARDIAC) 20 MG/ML IV SOLN
INTRAVENOUS | Status: DC | PRN
  Administered 2017-01-14: 30 mg via INTRAVENOUS
  Administered 2017-01-14: 70 mg via INTRAVENOUS

## 2017-01-14 SURGICAL SUPPLY — 17 items
ABLATOR ENDOMETRIAL BIPOLAR (ABLATOR) IMPLANT
CANISTER SUCT 3000ML PPV (MISCELLANEOUS) ×3 IMPLANT
CATH ROBINSON RED A/P 16FR (CATHETERS) ×3 IMPLANT
CLOTH BEACON ORANGE TIMEOUT ST (SAFETY) ×3 IMPLANT
CONTAINER PREFILL 10% NBF 60ML (FORM) ×6 IMPLANT
ELECT REM PT RETURN 9FT ADLT (ELECTROSURGICAL)
ELECTRODE REM PT RTRN 9FT ADLT (ELECTROSURGICAL) ×1 IMPLANT
GLOVE BIO SURGEON STRL SZ 6.5 (GLOVE) ×2 IMPLANT
GLOVE BIO SURGEONS STRL SZ 6.5 (GLOVE) ×1
GLOVE BIOGEL PI IND STRL 7.0 (GLOVE) ×2 IMPLANT
GLOVE BIOGEL PI INDICATOR 7.0 (GLOVE) ×4
GOWN STRL REUS W/TWL LRG LVL3 (GOWN DISPOSABLE) ×6 IMPLANT
PACK VAGINAL MINOR WOMEN LF (CUSTOM PROCEDURE TRAY) ×3 IMPLANT
PAD OB MATERNITY 4.3X12.25 (PERSONAL CARE ITEMS) ×3 IMPLANT
TOWEL OR 17X24 6PK STRL BLUE (TOWEL DISPOSABLE) ×6 IMPLANT
TUBING AQUILEX INFLOW (TUBING) ×3 IMPLANT
TUBING AQUILEX OUTFLOW (TUBING) ×3 IMPLANT

## 2017-01-14 NOTE — Anesthesia Procedure Notes (Signed)
Procedure Name: LMA Insertion Date/Time: 01/14/2017 12:49 PM Performed by: Tobin Chad Pre-anesthesia Checklist: Patient identified, Emergency Drugs available, Suction available and Patient being monitored Patient Re-evaluated:Patient Re-evaluated prior to induction Oxygen Delivery Method: Circle system utilized and Simple face mask Preoxygenation: Pre-oxygenation with 100% oxygen Induction Type: Combination inhalational/ intravenous induction Ventilation: Mask ventilation without difficulty LMA: LMA inserted LMA Size: 4.0 Grade View: Grade II Number of attempts: 2 Placement Confirmation: positive ETCO2 and breath sounds checked- equal and bilateral Dental Injury: Teeth and Oropharynx as per pre-operative assessment

## 2017-01-14 NOTE — Anesthesia Preprocedure Evaluation (Signed)
Anesthesia Evaluation  Patient identified by MRN, date of birth, ID band Patient awake    Reviewed: Allergy & Precautions, NPO status , Patient's Chart, lab work & pertinent test results  Airway Mallampati: II  TM Distance: >3 FB Neck ROM: Full    Dental no notable dental hx. (+) Poor Dentition, Loose   Pulmonary former smoker, PE   Pulmonary exam normal breath sounds clear to auscultation       Cardiovascular hypertension, Pt. on medications Normal cardiovascular exam Rhythm:Regular Rate:Normal     Neuro/Psych negative neurological ROS  negative psych ROS   GI/Hepatic Neg liver ROS, hiatal hernia,   Endo/Other  Morbid obesity  Renal/GU negative Renal ROS  negative genitourinary   Musculoskeletal negative musculoskeletal ROS (+)   Abdominal   Peds negative pediatric ROS (+)  Hematology negative hematology ROS (+)   Anesthesia Other Findings   Reproductive/Obstetrics negative OB ROS                             Anesthesia Physical Anesthesia Plan  ASA: III  Anesthesia Plan: General   Post-op Pain Management:    Induction: Intravenous  PONV Risk Score and Plan: 4 or greater and Ondansetron, Dexamethasone, Scopolamine patch - Pre-op and Treatment may vary due to age or medical condition  Airway Management Planned: LMA  Additional Equipment:   Intra-op Plan:   Post-operative Plan: Extubation in OR  Informed Consent: I have reviewed the patients History and Physical, chart, labs and discussed the procedure including the risks, benefits and alternatives for the proposed anesthesia with the patient or authorized representative who has indicated his/her understanding and acceptance.   Dental advisory given  Plan Discussed with: CRNA  Anesthesia Plan Comments:         Anesthesia Quick Evaluation

## 2017-01-14 NOTE — Transfer of Care (Signed)
Immediate Anesthesia Transfer of Care Note  Patient: Kelly [name]  Procedure(s) Performed: Procedure(s): DILATATION AND CURETTAGE /HYSTEROSCOPY (N/A)  Patient Location: PACU  Anesthesia Type:General  Level of Consciousness: awake, alert , oriented and patient cooperative  Airway & Oxygen Therapy: Patient Spontanous Breathing and Patient connected to nasal cannula oxygen  Post-op Assessment: Report given to RN and Post -op Vital signs reviewed and stable  Post vital signs: Reviewed and stable  Last Vitals:  Vitals:   01/14/17 1144  BP: (!) 108/53  Pulse: 74  Resp: (!) 22  Temp: 36.7 C    Last Pain:  Vitals:   01/14/17 1144  TempSrc: Oral  PainSc: 5       Patients Stated Pain Goal: 5 (29/29/09 0301)  Complications: No apparent anesthesia complications

## 2017-01-14 NOTE — Op Note (Signed)
PREOPERATIVE DIAGNOSIS:  Postmenopausal uterine bleeding POSTOPERATIVE DIAGNOSIS: Endometrial polyp PROCEDURE: Hysteroscopy, Dilation and Curettage. SURGEON:  Dr. Emeterio Reeve   INDICATIONS: 66 y.o. G1P1001  here for scheduled surgery for the aforementioned diagnoses.   Risks of surgery were discussed with the patient including but not limited to: bleeding which may require transfusion; infection which may require antibiotics; injury to uterus or surrounding organs; intrauterine scarring which may impair future fertility; need for additional procedures including laparotomy or laparoscopy; and other postoperative/anesthesia complications. Written informed consent was obtained.    FINDINGS:  A 6 week size uterus.  Prolapsed endometrial polyp and proliferative endometrium.  Normal ostia bilaterally.  ANESTHESIA:   General, paracervical block with 30 ml of 0.5% Marcaine abd 1:200000 epinephrine INTRAVENOUS FLUIDS:  500 ml of LR FLUID DEFICITS:  280ml of saline ESTIMATED BLOOD LOSS:  Less than 20 ml SPECIMENS: Endometrial curettings and polyp sent to pathology COMPLICATIONS:  None immediate.  PROCEDURE DETAILS:  The patient was taken to the operating room where general anesthesia was administered and was found to be adequate.  After an adequate timeout was performed, she was placed in the dorsal lithotomy position and examined; then prepped and draped in the sterile manner.   Her bladder was catheterized for an unmeasured amount of clear, yellow urine. A speculum was then placed in the patient's vagina and a single tooth tenaculum was applied to the anterior lip of the cervix.   A paracervical block using 30 ml of 0.5% Marcaine was administered.  A prolapsed polyp was seen at the cervical os and this was grasped with ring forceps and removed intact. The cervix was dilated manually with metal dilators to accommodate the 5 mm diagnostic hysteroscope.  Once the cervix was dilated, the hysteroscope was  inserted under direct visualization using saline as a suspension medium.  The uterine cavity was carefully examined with the findings as noted above.   A small endometrial polyp and some proliferative endometrium was seen. After further careful visualization of the uterine cavity, the hysteroscope was removed under direct visualization.  A sharp curettage was then performed to obtain a moderate amount of endometrial curettings.  The tenaculum was removed from the anterior lip of the cervix and the vaginal speculum was removed after noting good hemostasis.  The patient tolerated the procedure well and was taken to the recovery area awake, extubated and in stable condition.  The patient will be discharged to home as per PACU criteria.  Routine postoperative instructions given.   She will follow up in the clinic in 3 weeks  for postoperative evaluation.  Dr. Emeterio Reeve 01/14/2017 1:30 PM

## 2017-01-14 NOTE — Discharge Instructions (Signed)
DISCHARGE INSTRUCTIONS: D & C HYSTEROSCOPY The following instructions have been prepared to help you care for yourself upon your return home.  May take Ibuprofen after 7:12 pm today  Personal hygiene:  Use sanitary pads for vaginal drainage, not tampons.  Shower the day after your procedure.  NO tub baths, pools or Jacuzzis for 2-3 weeks.  Wipe front to back after using the bathroom.  Activity and limitations:  Do NOT drive or operate any equipment for 24 hours. The effects of anesthesia are still present and drowsiness may result.  Do NOT rest in bed all day.  Walking is encouraged.  Walk up and down stairs slowly.  You may resume your normal activity in one to two days or as indicated by your physician.  Sexual activity: NO intercourse for at least 2 weeks after the procedure, or as indicated by your Doctor.  Diet: Eat a light meal as desired this evening. You may resume your usual diet tomorrow.  Return to Work: You may resume your work activities in one to two days or as indicated by Marine scientist.  What to expect after your surgery: Expect to have vaginal bleeding/discharge for 2-3 days and spotting for up to 10 days. It is not unusual to have soreness for up to 1-2 weeks. You may have a slight burning sensation when you urinate for the first day. Mild cramps may continue for a couple of days. You may have a regular period in 2-6 weeks.  Call your doctor for any of the following:  Excessive vaginal bleeding or clotting, saturating and changing one pad every hour.  Inability to urinate 6 hours after discharge from hospital.  Pain not relieved by pain medication.  Fever of 100.4 F or greater.  Unusual vaginal discharge or odor.   Patients signature: ______________________  Nurses signature ________________________  Support person's signature__________________________

## 2017-01-14 NOTE — Anesthesia Postprocedure Evaluation (Signed)
Anesthesia Post Note  Patient: Kelly [name]  Procedure(s) Performed: Procedure(s) (LRB): DILATATION AND CURETTAGE /HYSTEROSCOPY (N/A)     Patient location during evaluation: PACU Anesthesia Type: General Level of consciousness: awake and alert Pain management: pain level controlled Vital Signs Assessment: post-procedure vital signs reviewed and stable Respiratory status: spontaneous breathing, nonlabored ventilation, respiratory function stable and patient connected to nasal cannula oxygen Cardiovascular status: blood pressure returned to baseline and stable Postop Assessment: no signs of nausea or vomiting Anesthetic complications: no    Last Vitals:  Vitals:   01/14/17 1345 01/14/17 1430  BP: 121/68 (!) 113/55  Pulse: (!) 57 67  Resp: 18 18  Temp:  36.7 C    Last Pain:  Vitals:   01/14/17 1430  TempSrc:   PainSc: 0-No pain   Pain Goal: Patients Stated Pain Goal: 5 (01/14/17 1144)               Montez Hageman
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
## 2017-01-15 ENCOUNTER — Encounter (HOSPITAL_COMMUNITY): Payer: Self-pay | Admitting: Obstetrics & Gynecology

## 2017-01-15 NOTE — Addendum Note (Signed)
Addendum  created 01/15/17 1224 by Montez Hageman, MD   Anesthesia Staff edited

## 2017-01-18 ENCOUNTER — Telehealth: Payer: Self-pay | Admitting: *Deleted

## 2017-01-18 ENCOUNTER — Other Ambulatory Visit: Payer: Self-pay | Admitting: Obstetrics & Gynecology

## 2017-01-18 MED ORDER — MEGESTROL ACETATE 40 MG PO TABS: 40.0000 mg | ORAL_TABLET | Freq: Two times a day (BID) | ORAL | 1 refills | Status: DC

## 2017-01-18 MED ORDER — IBUPROFEN 600 MG PO TABS: 600.0000 mg | ORAL_TABLET | Freq: Four times a day (QID) | ORAL | 1 refills | Status: DC | PRN

## 2017-01-18 NOTE — Telephone Encounter (Signed)
error 

## 2017-01-18 NOTE — Telephone Encounter (Signed)
Patient called front desk and left message she needed a refill of megace.  Per Dr.Arnold I called Vaughan Basta and notified her he sent a refill of megace to her pharmacy and also notified her that her surgical pathology was negative. She voices understanding.
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
## 2017-01-26 ENCOUNTER — Other Ambulatory Visit: Payer: Self-pay | Admitting: Family Medicine

## 2017-01-26 DIAGNOSIS — I10 Essential (primary) hypertension: Secondary | ICD-10-CM

## 2017-02-03 ENCOUNTER — Encounter: Payer: Self-pay | Admitting: *Deleted

## 2017-02-03 ENCOUNTER — Ambulatory Visit (INDEPENDENT_AMBULATORY_CARE_PROVIDER_SITE_OTHER): Payer: Medicare Other | Admitting: *Deleted

## 2017-02-03 VITALS — BP 131/64 | HR 77 | Ht 60.0 in | Wt 278.0 lb

## 2017-02-03 DIAGNOSIS — Z0000 Encounter for general adult medical examination without abnormal findings: Secondary | ICD-10-CM | POA: Diagnosis not present

## 2017-02-03 NOTE — Patient Instructions (Signed)
Kelly [name] , Thank you for taking time to come for your Medicare Wellness Visit. I appreciate your ongoing commitment to your health goals. Please review the following plan we discussed and let me know if I can assist you in the future.   These are the goals we discussed: Goals      Patient Stated   . Lose 15lbs in 1 year.   (pt-stated)       This is a list of the screening recommended for you and due dates:  Health Maintenance  Topic Date Due  . Pneumonia vaccines (2 of 2 - PPSV23) 07/28/2015  . Flu Shot  01/13/2017  . Mammogram  12/16/2018  . Colon Cancer Screening  06/27/2020  . Tetanus Vaccine  10/30/2023  . DEXA scan (bone density measurement)  Completed  .  Hepatitis C: One time screening is recommended by Center for Disease Control  (CDC) for  adults born from [birth year] through 1967.   Completed    Follow up with Dr.Lowne as scheduled. Call if you need [place] before.  Continue to eat heart healthy diet (full of fruits, vegetables, whole grains, lean protein, water--limit salt, fat, and sugar intake) and increase physical activity as tolerated.  Continue doing brain stimulating activities (puzzles, reading, adult coloring books, staying active) to keep memory sharp.    Health Maintenance for Postmenopausal Women Menopause is a normal process in which your reproductive ability comes to an end. This process happens gradually over a span of months to years, usually between the ages of 76 and 52. Menopause is complete when you have missed 12 consecutive menstrual periods. It is important to talk with your health care provider about some of the most common conditions that affect postmenopausal women, such as heart disease, cancer, and bone loss (osteoporosis). Adopting a healthy lifestyle and getting preventive care can help to promote your health and wellness. Those actions can also lower your chances of developing some of these common conditions. What should I know about  menopause? During menopause, you may experience a number of symptoms, such as:  Moderate-to-severe hot flashes.  Night sweats.  Decrease in sex drive.  Mood swings.  Headaches.  Tiredness.  Irritability.  Memory problems.  Insomnia.  Choosing to treat or not to treat menopausal changes is an individual decision that you make with your health care provider. What should I know about hormone replacement therapy and supplements? Hormone therapy products are effective for treating symptoms that are associated with menopause, such as hot flashes and night sweats. Hormone replacement carries certain risks, especially as you become older. If you are thinking about using estrogen or estrogen with progestin treatments, discuss the benefits and risks with your health care provider. What should I know about heart disease and stroke? Heart disease, heart attack, and stroke become more likely as you age. This may be due, in part, to the hormonal changes that your body experiences during menopause. These can affect how your body processes dietary fats, triglycerides, and cholesterol. Heart attack and stroke are both medical emergencies. There are many things that you can do to help prevent heart disease and stroke:  Have your blood pressure checked at least every 1-2 years. High blood pressure causes heart disease and increases the risk of stroke.  If you are 39-54 years old, ask your health care provider if you should take aspirin to prevent a heart attack or a stroke.  Do not use any tobacco products, including cigarettes, chewing tobacco, or electronic cigarettes.  If you need help quitting, ask your health care provider.  It is important to eat a healthy diet and maintain a healthy weight. ? Be sure to include plenty of vegetables, fruits, low-fat dairy products, and lean protein. ? Avoid eating foods that are high in solid fats, added sugars, or salt (sodium).  Get regular exercise. This  is one of the most important things that you can do for your health. ? Try to exercise for at least 150 minutes each week. The type of exercise that you do should increase your heart rate and make you sweat. This is known as moderate-intensity exercise. ? Try to do strengthening exercises at least twice each week. Do these in addition to the moderate-intensity exercise.  Know your numbers.Ask your health care provider to check your cholesterol and your blood glucose. Continue to have your blood tested as directed by your health care provider.  What should I know about cancer screening? There are several types of cancer. Take the following steps to reduce your risk and to catch any cancer development as early as possible. Breast Cancer  Practice breast self-awareness. ? This means understanding how your breasts normally appear and feel. ? It also means doing regular breast self-exams. Let your health care provider know about any changes, no matter how small.  If you are 24 or older, have a clinician do a breast exam (clinical breast exam or CBE) every year. Depending on your age, family history, and medical history, it may be recommended that you also have a yearly breast X-ray (mammogram).  If you have a family history of breast cancer, talk with your health care provider about genetic screening.  If you are at high risk for breast cancer, talk with your health care provider about having an MRI and a mammogram every year.  Breast cancer (BRCA) gene test is recommended for women who have family members with BRCA-related cancers. Results of the assessment will determine the need for genetic counseling and BRCA1 and for BRCA2 testing. BRCA-related cancers include these types: ? Breast. This occurs in males or females. ? Ovarian. ? Tubal. This may also be called fallopian tube cancer. ? Cancer of the abdominal or pelvic lining (peritoneal cancer). ? Prostate. ? Pancreatic.  Cervical,  Uterine, and Ovarian Cancer Your health care provider may recommend that you be screened regularly for cancer of the pelvic organs. These include your ovaries, uterus, and vagina. This screening involves a pelvic exam, which includes checking for microscopic changes to the surface of your cervix (Pap test).  For women ages 21-65, health care providers may recommend a pelvic exam and a Pap test every three years. For women ages 19-65, they may recommend the Pap test and pelvic exam, combined with testing for human papilloma virus (HPV), every five years. Some types of HPV increase your risk of cervical cancer. Testing for HPV may also be done on women of any age who have unclear Pap test results.  Other health care providers may not recommend any screening for nonpregnant women who are considered low risk for pelvic cancer and have no symptoms. Ask your health care provider if a screening pelvic exam is right for you.  If you have had past treatment for cervical cancer or a condition that could lead to cancer, you need Pap tests and screening for cancer for at least 20 years after your treatment. If Pap tests have been discontinued for you, your risk factors (such as having a new sexual partner) need  to be reassessed to determine if you should start having screenings again. Some women have medical problems that increase the chance of getting cervical cancer. In these cases, your health care provider may recommend that you have screening and Pap tests more often.  If you have a family history of uterine cancer or ovarian cancer, talk with your health care provider about genetic screening.  If you have vaginal bleeding after reaching menopause, tell your health care provider.  There are currently no reliable tests available to screen for ovarian cancer.  Lung Cancer Lung cancer screening is recommended for adults 31-80 years old who are at high risk for lung cancer because of a history of smoking. A  yearly low-dose CT scan of the lungs is recommended if you:  Currently smoke.  Have a history of at least 30 pack-years of smoking and you currently smoke or have quit within the past 15 years. A pack-year is smoking an average of one pack of cigarettes per day for one year.  Yearly screening should:  Continue until it has been 15 years since you quit.  Stop if you develop a health problem that would prevent you from having lung cancer treatment.  Colorectal Cancer  This type of cancer can be detected and can often be prevented.  Routine colorectal cancer screening usually begins at age 28 and continues through age 58.  If you have risk factors for colon cancer, your health care provider may recommend that you be screened at an earlier age.  If you have a family history of colorectal cancer, talk with your health care provider about genetic screening.  Your health care provider may also recommend using home test kits to check for hidden blood in your stool.  A small camera at the end of a tube can be used to examine your colon directly (sigmoidoscopy or colonoscopy). This is done to check for the earliest forms of colorectal cancer.  Direct examination of the colon should be repeated every 5-10 years until age 29. However, if early forms of precancerous polyps or small growths are found or if you have a family history or genetic risk for colorectal cancer, you may need to be screened more often.  Skin Cancer  Check your skin from head to toe regularly.  Monitor any moles. Be sure to tell your health care provider: ? About any new moles or changes in moles, especially if there is a change in a mole's shape or color. ? If you have a mole that is larger than the size of a pencil eraser.  If any of your family members has a history of skin cancer, especially at a young age, talk with your health care provider about genetic screening.  Always use sunscreen. Apply sunscreen liberally  and repeatedly throughout the day.  Whenever you are outside, protect yourself by wearing long sleeves, pants, a wide-brimmed hat, and sunglasses.  What should I know about osteoporosis? Osteoporosis is a condition in which bone destruction happens more quickly than new bone creation. After menopause, you may be at an increased risk for osteoporosis. To help prevent osteoporosis or the bone fractures that can happen because of osteoporosis, the following is recommended:  If you are 64-67 years old, get at least 1,000 mg of calcium and at least 600 mg of vitamin D per day.  If you are older than age 49 but younger than age 9, get at least 1,200 mg of calcium and at least 600 mg of vitamin  D per day.  If you are older than age 69, get at least 1,200 mg of calcium and at least 800 mg of vitamin D per day.  Smoking and excessive alcohol intake increase the risk of osteoporosis. Eat foods that are rich in calcium and vitamin D, and do weight-bearing exercises several times each week as directed by your health care provider. What should I know about how menopause affects my mental health? Depression may occur at any age, but it is more common as you become older. Common symptoms of depression include:  Low or sad mood.  Changes in sleep patterns.  Changes in appetite or eating patterns.  Feeling an overall lack of motivation or enjoyment of activities that you previously enjoyed.  Frequent crying spells.  Talk with your health care provider if you think that you are experiencing depression. What should I know about immunizations? It is important that you get and maintain your immunizations. These include:  Tetanus, diphtheria, and pertussis (Tdap) booster vaccine.  Influenza every year before the flu season begins.  Pneumonia vaccine.  Shingles vaccine.  Your health care provider may also recommend other immunizations. This information is not intended to replace advice given to you  by your health care provider. Make sure you discuss any questions you have with your health care provider. Document Released: 07/24/2005 Document Revised: 12/20/2015 Document Reviewed: 03/05/2015 Elsevier Interactive Patient Education  2018 [employer].

## 2017-02-11 ENCOUNTER — Ambulatory Visit (INDEPENDENT_AMBULATORY_CARE_PROVIDER_SITE_OTHER): Payer: Medicare Other | Admitting: Obstetrics & Gynecology

## 2017-02-11 ENCOUNTER — Encounter: Payer: Self-pay | Admitting: Obstetrics & Gynecology

## 2017-02-11 VITALS — Wt 271.9 lb

## 2017-02-11 DIAGNOSIS — Z9889 Other specified postprocedural states: Secondary | ICD-10-CM | POA: Diagnosis not present

## 2017-02-11 DIAGNOSIS — N950 Postmenopausal bleeding: Secondary | ICD-10-CM

## 2017-02-11 MED ORDER — MEGESTROL ACETATE 40 MG PO TABS: 40.0000 mg | ORAL_TABLET | Freq: Two times a day (BID) | ORAL | 2 refills | Status: DC

## 2017-02-11 NOTE — Progress Notes (Signed)
Subjective:     Kelly [name] is a 66 y.o. female who presents to the clinic 4 weeks status post diagnostic hysteroscopy and curettage for abnormal uterine bleeding. Eating a regular diet without difficulty. Bowel movements are normal. The patient is not having any pain.  The following portions of the patient's history were reviewed and updated as appropriate: allergies, current medications, past family history, past medical history, past social history, past surgical history and problem list.  Review of Systems she had bleeding for 2 weeks postop and no bleeding now. She ran out of Megace 3 weeks ago    Objective:    Wt 271 lb 14.4 oz (123.3 kg)   BMI 53.10 kg/m  General:  alert, cooperative and no distress  Abdomen: Not examined  Incision:   n/a     Assessment:    Doing well postoperatively. Operative findings again reviewed. Pathology report discussed.   Benign endometrial polyps Plan:    1. Continue any current medications. 2. Wound care discussed. 3. Activity restrictions: none 4. Anticipated return to work: not applicable. 5. Follow up: 4 months after taking megace for 3 more months  Woodroe Mode, MD 02/11/2017

## 2017-02-11 NOTE — Patient Instructions (Signed)

## 2017-02-16 DIAGNOSIS — Z961 Presence of intraocular lens: Secondary | ICD-10-CM | POA: Diagnosis not present

## 2017-02-23 ENCOUNTER — Other Ambulatory Visit: Payer: Self-pay | Admitting: Family Medicine

## 2017-02-23 DIAGNOSIS — E039 Hypothyroidism, unspecified: Secondary | ICD-10-CM

## 2017-03-26 ENCOUNTER — Other Ambulatory Visit: Payer: Self-pay

## 2017-03-26 DIAGNOSIS — E039 Hypothyroidism, unspecified: Secondary | ICD-10-CM

## 2017-03-26 DIAGNOSIS — I10 Essential (primary) hypertension: Secondary | ICD-10-CM

## 2017-03-26 MED ORDER — FUROSEMIDE 40 MG PO TABS: 40.0000 mg | ORAL_TABLET | Freq: Every day | ORAL | 0 refills | Status: DC

## 2017-03-26 MED ORDER — SYNTHROID 75 MCG PO TABS: ORAL_TABLET | ORAL | 0 refills | Status: DC

## 2017-03-28 ENCOUNTER — Other Ambulatory Visit: Payer: Self-pay | Admitting: Family Medicine

## 2017-03-28 DIAGNOSIS — I10 Essential (primary) hypertension: Secondary | ICD-10-CM

## 2017-03-28 DIAGNOSIS — E785 Hyperlipidemia, unspecified: Secondary | ICD-10-CM

## 2017-03-30 ENCOUNTER — Telehealth: Payer: Self-pay | Admitting: Family Medicine

## 2017-03-30 NOTE — Telephone Encounter (Signed)
Caller name: Relation to SH:UOHF Call back number:[phone] Pharmacy:[facility]  Reason for call: pt states she has changed pharmacies, and would like it updated in her chart, pt now uses [facility], [place], [place] [address] [phone]

## 2017-04-26 ENCOUNTER — Telehealth: Payer: Self-pay | Admitting: Family Medicine

## 2017-04-26 NOTE — Telephone Encounter (Signed)
Ferosimide and Synthroiod sent to [facility] on four separate dates for refill request per [facility] [phone] and fax# [phone].  Please call these medications in. Pharmacy has faxed it numerous times with no response.

## 2017-04-26 NOTE — Telephone Encounter (Signed)
Ferosimide and Synthroiod sent to [facility] on four separate dates for refill request per [facility] ph# [phone] and fax# [phone].  Please call these medications in. Pharmacy has faxed it numerous times with no response.

## 2017-04-27 ENCOUNTER — Telehealth: Payer: Self-pay | Admitting: *Deleted

## 2017-04-27 ENCOUNTER — Other Ambulatory Visit: Payer: Self-pay | Admitting: Family Medicine

## 2017-04-27 DIAGNOSIS — E039 Hypothyroidism, unspecified: Secondary | ICD-10-CM

## 2017-04-27 DIAGNOSIS — I10 Essential (primary) hypertension: Secondary | ICD-10-CM

## 2017-04-27 MED ORDER — SYNTHROID 75 MCG PO TABS: ORAL_TABLET | ORAL | 1 refills | Status: DC

## 2017-04-27 MED ORDER — FUROSEMIDE 40 MG PO TABS: 40.0000 mg | ORAL_TABLET | Freq: Every day | ORAL | 1 refills | Status: DC

## 2017-04-27 NOTE — Telephone Encounter (Signed)
Sent High Priority as pt has been out of SYNTHROID for some time. Please contact [facility] to fill SYNTHROID 75 MCG and Generic Lasix 40 mg.  Phone [phone]. Fax [phone].

## 2017-04-27 NOTE — Telephone Encounter (Signed)
Received Physician Orders/CMN from [place]; forwarded to provider/SLS 11/13

## 2017-04-27 NOTE — Telephone Encounter (Signed)
Why did I not receive any of the messages?

## 2017-04-28 ENCOUNTER — Other Ambulatory Visit: Payer: Self-pay | Admitting: *Deleted

## 2017-04-28 MED ORDER — MEGESTROL ACETATE 40 MG PO TABS: 40.0000 mg | ORAL_TABLET | Freq: Two times a day (BID) | ORAL | 2 refills | Status: DC

## 2017-04-28 NOTE — Telephone Encounter (Signed)
Ok thanks 

## 2017-04-28 NOTE — Telephone Encounter (Signed)
Called to follow up with patient.  Patient did get her medication.  I apologized about the delay.  Just FYI patient stated that she was never out of medication.

## 2017-04-28 NOTE — Progress Notes (Signed)
Received fax from [facility] that pt needed refill of Megace. Per chart review, the original quantity prescribed was inadequate for plan of care per notes from Dr. Roselie Awkward on 02/11/17. Refill e-prescribed. Pt needs follow up appt and will be called with appt details.

## 2017-05-01 ENCOUNTER — Inpatient Hospital Stay (HOSPITAL_COMMUNITY)
Admission: AD | Admit: 2017-05-01 | Discharge: 2017-05-01 | Disposition: A | Discharge status: Home / Self Care | Payer: Medicare Other | Source: Ambulatory Visit | Attending: Obstetrics and Gynecology | Admitting: Obstetrics and Gynecology

## 2017-05-01 ENCOUNTER — Encounter (HOSPITAL_COMMUNITY): Payer: Self-pay

## 2017-05-01 DIAGNOSIS — K449 Diaphragmatic hernia without obstruction or gangrene: Secondary | ICD-10-CM | POA: Insufficient documentation

## 2017-05-01 DIAGNOSIS — Z79899 Other long term (current) drug therapy: Secondary | ICD-10-CM | POA: Insufficient documentation

## 2017-05-01 DIAGNOSIS — I10 Essential (primary) hypertension: Secondary | ICD-10-CM | POA: Insufficient documentation

## 2017-05-01 DIAGNOSIS — Z86711 Personal history of pulmonary embolism: Secondary | ICD-10-CM | POA: Diagnosis not present

## 2017-05-01 DIAGNOSIS — Z9049 Acquired absence of other specified parts of digestive tract: Secondary | ICD-10-CM | POA: Insufficient documentation

## 2017-05-01 DIAGNOSIS — G4700 Insomnia, unspecified: Secondary | ICD-10-CM | POA: Diagnosis not present

## 2017-05-01 DIAGNOSIS — Z853 Personal history of malignant neoplasm of breast: Secondary | ICD-10-CM | POA: Insufficient documentation

## 2017-05-01 DIAGNOSIS — E039 Hypothyroidism, unspecified: Secondary | ICD-10-CM | POA: Diagnosis not present

## 2017-05-01 DIAGNOSIS — R1084 Generalized abdominal pain: Secondary | ICD-10-CM | POA: Diagnosis not present

## 2017-05-01 DIAGNOSIS — M1990 Unspecified osteoarthritis, unspecified site: Secondary | ICD-10-CM | POA: Insufficient documentation

## 2017-05-01 DIAGNOSIS — Z87891 Personal history of nicotine dependence: Secondary | ICD-10-CM | POA: Insufficient documentation

## 2017-05-01 DIAGNOSIS — N939 Abnormal uterine and vaginal bleeding, unspecified: Secondary | ICD-10-CM | POA: Insufficient documentation

## 2017-05-01 DIAGNOSIS — Z7982 Long term (current) use of aspirin: Secondary | ICD-10-CM | POA: Diagnosis not present

## 2017-05-01 DIAGNOSIS — E785 Hyperlipidemia, unspecified: Secondary | ICD-10-CM | POA: Diagnosis not present

## 2017-05-01 DIAGNOSIS — Z885 Allergy status to narcotic agent status: Secondary | ICD-10-CM | POA: Insufficient documentation

## 2017-05-01 DIAGNOSIS — Z96653 Presence of artificial knee joint, bilateral: Secondary | ICD-10-CM | POA: Insufficient documentation

## 2017-05-01 DIAGNOSIS — Z886 Allergy status to analgesic agent status: Secondary | ICD-10-CM | POA: Insufficient documentation

## 2017-05-01 HISTORY — DX: Weakness: R53.1

## 2017-05-01 LAB — CBC
HCT: 36.5 % (ref 36.0–46.0)
Hemoglobin: 11 g/dL — ABNORMAL LOW (ref 12.0–15.0)
MCH: 27.8 pg (ref 26.0–34.0)
MCHC: 30.1 g/dL (ref 30.0–36.0)
MCV: 92.2 fL (ref 78.0–100.0)
PLATELETS: 208 10*3/uL (ref 150–400)
RBC: 3.96 MIL/uL (ref 3.87–5.11)
RDW: 16.6 % — AB (ref 11.5–15.5)
WBC: 11.3 10*3/uL — ABNORMAL HIGH (ref 4.0–10.5)

## 2017-05-01 MED ORDER — MEGESTROL ACETATE 40 MG PO TABS: ORAL_TABLET | ORAL | 0 refills | Status: DC

## 2017-05-01 NOTE — Discharge Instructions (Signed)
Get your medication from your pharmacy tonight and begin.  Your other prescription should come from [place]. Expect the clinic to call and schedule a follow up appointment with Dr. Roselie Awkward.

## 2017-05-01 NOTE — MAU Note (Signed)
Polyp removed and d&c in august. Been spotting since yesterday morning and then yesterday around 1600 it got heavier. Changing diaper every 2-3 hours. Passing a few clots everytime she goes to the bathroom.
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
## 2017-05-03 ENCOUNTER — Telehealth: Payer: Self-pay | Admitting: General Practice

## 2017-05-03 DIAGNOSIS — N938 Other specified abnormal uterine and vaginal bleeding: Secondary | ICD-10-CM

## 2017-05-03 NOTE — Telephone Encounter (Signed)
Patient called and left message stating she is a patient of Dr Jordan Hawks. Patient states she had a problem 5 months ago for bleeding. Patient states she has started to bleed again and she was previously given a pill for this and would like to know if Dr Roselie Awkward can send her in something again.

## 2017-05-05 MED ORDER — MEGESTROL ACETATE 40 MG PO TABS: 80.0000 mg | ORAL_TABLET | Freq: Two times a day (BID) | ORAL | 1 refills | Status: DC

## 2017-05-05 NOTE — Telephone Encounter (Signed)
Patient called and left message stating she was seen in MAU over the weekend for excessive bleeding and clotting. Patient states she was given a medication but it isn't working. Spoke with Dr Harolyn Rutherford who states patient can have 80mg  BID. Called and informed patient. Patient verbalized understanding of new dose and new Rx sent to pharmacy. Patient states she has been real worried and concerned about all this bleeding because this has never happened before. Told patient this higher dose which she should start today should work but if she doesn't get better in the next several days to return to MAU, as we will be closed for the long holiday week. Patient verbalized understanding and had no other questions

## 2017-05-11 ENCOUNTER — Other Ambulatory Visit: Payer: Self-pay | Admitting: Family Medicine

## 2017-06-09 ENCOUNTER — Telehealth: Payer: Self-pay | Admitting: *Deleted

## 2017-06-09 NOTE — Telephone Encounter (Signed)
Received Physician Orders from [facility] for mobility device repairs; forwarded to provider/SLS 12/26

## 2017-06-11 ENCOUNTER — Encounter (HOSPITAL_COMMUNITY): Payer: Self-pay

## 2017-06-11 ENCOUNTER — Ambulatory Visit (INDEPENDENT_AMBULATORY_CARE_PROVIDER_SITE_OTHER): Payer: Medicare Other | Admitting: Obstetrics & Gynecology

## 2017-06-11 VITALS — BP 151/65 | HR 102 | Wt 272.8 lb

## 2017-06-11 DIAGNOSIS — N950 Postmenopausal bleeding: Secondary | ICD-10-CM | POA: Diagnosis not present

## 2017-06-11 DIAGNOSIS — N938 Other specified abnormal uterine and vaginal bleeding: Secondary | ICD-10-CM | POA: Diagnosis not present

## 2017-06-11 MED ORDER — MEGESTROL ACETATE 40 MG PO TABS: 80.0000 mg | ORAL_TABLET | Freq: Two times a day (BID) | ORAL | 1 refills | Status: DC
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
## 2017-06-11 NOTE — Patient Instructions (Signed)
Hysterectomy Information A hysterectomy is a surgery to remove your uterus. After surgery, you will no longer have periods. Also, you will not be able to get pregnant. Reasons for this surgery  You have bleeding that is not normal and keeps coming back.  You have lasting (chronic) lower belly (pelvic) pain.  You have a lasting infection.  The lining of your uterus grows outside your uterus.  The lining of your uterus grows in the muscle of your uterus.  Your uterus falls down into your vagina.  You have a growth in your uterus that causes problems.  You have cells that could turn into cancer (precancerous cells).  You have cancer of the uterus or cervix. Types There are 3 types of hysterectomies. Depending on the type, the surgery will:  Remove the top part of the uterus only.  Remove the uterus and the cervix.  Remove the uterus, cervix, and tissue that holds the uterus in place in the lower belly.  Ways a hysterectomy can be performed There are 5 ways this surgery can be performed.  A cut (incision) is made in the belly (abdomen). The uterus is taken out through the cut.  A cut is made in the vagina. The uterus is taken out through the cut.  Three or four cuts are made in the belly. A surgical device with a camera is put through one of the cuts. The uterus is cut into small pieces. The uterus is taken out through the cuts or the vagina.  Three or four cuts are made in the belly. A surgical device with a camera is put through one of the cuts. The uterus is taken out through the vagina.  Three or four cuts are made in the belly. A surgical device that is controlled by a computer makes a visual image. The device helps the surgeon control the surgical tools. The uterus is cut into small pieces. The pieces are taken out through the cuts or through the vagina.  What can I expect after the surgery?  You will be given pain medicine.  You will need help at home for 3-5 days  after surgery.  You will need to see your doctor in 2-4 weeks after surgery.  You may get hot flashes, have night sweats, and have trouble sleeping.  You may need to have Pap tests in the future if your surgery was related to cancer. Talk to your doctor. It is still good to have regular exams. This information is not intended to replace advice given to you by your health care provider. Make sure you discuss any questions you have with your health care provider. Document Released: 08/24/2011 Document Revised: 11/07/2015 Document Reviewed: 02/06/2013 Elsevier Interactive Patient Education  2018 [employer]  

## 2017-06-21 ENCOUNTER — Other Ambulatory Visit: Payer: Self-pay | Admitting: Family Medicine

## 2017-06-21 DIAGNOSIS — M792 Neuralgia and neuritis, unspecified: Secondary | ICD-10-CM

## 2017-06-22 ENCOUNTER — Telehealth: Payer: Self-pay | Admitting: Family Medicine

## 2017-06-22 NOTE — Telephone Encounter (Signed)
Copied from [place] [id number]. Topic: Referral - Request >> Jun 22, 2017 12:02 PM Cecelia Byars, NT wrote: Reason for CRM: Patient would like a referral with Dr Grandville Silos [address]  or for her feet please advise [phone]

## 2017-06-23 NOTE — Telephone Encounter (Signed)
Made appointment for patient.  We have not seen her for callus and corns on her foot.  She made an appt for next Thursday 1/17 at 215

## 2017-06-26 ENCOUNTER — Other Ambulatory Visit: Payer: Self-pay | Admitting: Family Medicine

## 2017-06-26 DIAGNOSIS — E785 Hyperlipidemia, unspecified: Secondary | ICD-10-CM

## 2017-06-26 DIAGNOSIS — I10 Essential (primary) hypertension: Secondary | ICD-10-CM

## 2017-07-01 ENCOUNTER — Ambulatory Visit (INDEPENDENT_AMBULATORY_CARE_PROVIDER_SITE_OTHER): Payer: Medicare Other | Admitting: Family Medicine

## 2017-07-01 ENCOUNTER — Encounter: Payer: Self-pay | Admitting: Family Medicine

## 2017-07-01 VITALS — BP 124/70 | HR 86 | Temp 98.2°F | Resp 16 | Ht 60.0 in | Wt 274.2 lb

## 2017-07-01 DIAGNOSIS — L84 Corns and callosities: Secondary | ICD-10-CM

## 2017-07-01 DIAGNOSIS — E039 Hypothyroidism, unspecified: Secondary | ICD-10-CM

## 2017-07-01 DIAGNOSIS — E785 Hyperlipidemia, unspecified: Secondary | ICD-10-CM

## 2017-07-01 NOTE — Patient Instructions (Signed)
We will refer you to a podiatrist for your feet We will be thinking about you next month with your surgery.
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
## 2017-07-02 ENCOUNTER — Telehealth: Payer: Self-pay | Admitting: Family Medicine

## 2017-07-02 DIAGNOSIS — M546 Pain in thoracic spine: Secondary | ICD-10-CM

## 2017-07-02 DIAGNOSIS — Z79899 Other long term (current) drug therapy: Secondary | ICD-10-CM

## 2017-07-02 LAB — LIPID PANEL
CHOL/HDL RATIO: 5
Cholesterol: 127 mg/dL (ref 0–200)
HDL: 27.2 mg/dL — ABNORMAL LOW (ref >39.00)
LDL CALC: 74 mg/dL (ref 0–99)
NONHDL: 100.14
Triglycerides: 132 mg/dL (ref 0.0–149.0)
VLDL: 26.4 mg/dL (ref 0.0–40.0)

## 2017-07-02 LAB — CBC WITH DIFFERENTIAL/PLATELET
Basophils Absolute: 0.1 10*3/uL (ref 0.0–0.1)
Basophils Relative: 0.9 % (ref 0.0–3.0)
EOS ABS: 0.2 10*3/uL (ref 0.0–0.7)
EOS PCT: 1.4 % (ref 0.0–5.0)
HCT: 37.1 % (ref 36.0–46.0)
HEMOGLOBIN: 11.8 g/dL — AB (ref 12.0–15.0)
Lymphocytes Relative: 21.3 % (ref 12.0–46.0)
Lymphs Abs: 2.5 10*3/uL (ref 0.7–4.0)
MCHC: 31.7 g/dL (ref 30.0–36.0)
MCV: 85.3 fl (ref 78.0–100.0)
MONO ABS: 0.6 10*3/uL (ref 0.1–1.0)
Monocytes Relative: 5.3 % (ref 3.0–12.0)
Neutro Abs: 8.3 10*3/uL — ABNORMAL HIGH (ref 1.4–7.7)
Neutrophils Relative %: 71.1 % (ref 43.0–77.0)
Platelets: 261 10*3/uL (ref 150.0–400.0)
RBC: 4.34 Mil/uL (ref 3.87–5.11)
RDW: 17.4 % — ABNORMAL HIGH (ref 11.5–15.5)
WBC: 11.7 10*3/uL — AB (ref 4.0–10.5)

## 2017-07-02 LAB — COMPREHENSIVE METABOLIC PANEL
ALBUMIN: 3.8 g/dL (ref 3.5–5.2)
ALK PHOS: 125 U/L — AB (ref 39–117)
ALT: 10 U/L (ref 0–35)
AST: 16 U/L (ref 0–37)
BUN: 13 mg/dL (ref 6–23)
CO2: 31 mEq/L (ref 19–32)
CREATININE: 0.61 mg/dL (ref 0.40–1.20)
Calcium: 9.5 mg/dL (ref 8.4–10.5)
Chloride: 102 mEq/L (ref 96–112)
GFR: 103.57 mL/min (ref >60.00)
GLUCOSE: 125 mg/dL — AB (ref 70–99)
Potassium: 4.1 mEq/L (ref 3.5–5.1)
SODIUM: 140 meq/L (ref 135–145)
TOTAL PROTEIN: 7.1 g/dL (ref 6.0–8.3)
Total Bilirubin: 0.7 mg/dL (ref 0.2–1.2)

## 2017-07-02 LAB — TSH: TSH: 4.14 u[IU]/mL (ref 0.35–4.50)

## 2017-07-02 NOTE — Telephone Encounter (Signed)
Copied from [place] [id number]. Topic: Quick Communication - Rx Refill/Question >> Jul 02, 2017 12:48 PM Wynetta Emery, [place] C wrote:   Medication:  HYDROcodone   Has the patient contacted their pharmacy? Yes    Pharmacy:  [facility]    Agent: Please be advised that RX refills may take up to 3 business days. We ask that you follow-up with your pharmacy.

## 2017-07-06 ENCOUNTER — Other Ambulatory Visit: Payer: Self-pay

## 2017-07-06 ENCOUNTER — Other Ambulatory Visit (INDEPENDENT_AMBULATORY_CARE_PROVIDER_SITE_OTHER): Payer: Medicare Other

## 2017-07-06 DIAGNOSIS — E785 Hyperlipidemia, unspecified: Secondary | ICD-10-CM

## 2017-07-06 DIAGNOSIS — R7309 Other abnormal glucose: Secondary | ICD-10-CM

## 2017-07-06 LAB — HEMOGLOBIN A1C: Hgb A1c MFr Bld: 5.9 % (ref 4.6–6.5)

## 2017-07-06 NOTE — Telephone Encounter (Signed)
Requesting:[place] Contract:no UDS:no Last OV:07/01/17 Next OV:12/09/17 Last Refill:07/20/16  #[id number]   Please advise

## 2017-07-07 NOTE — Telephone Encounter (Signed)
Ok to refill but we need a Soil scientist and uds

## 2017-07-08 ENCOUNTER — Other Ambulatory Visit: Payer: Self-pay | Admitting: Family Medicine

## 2017-07-08 ENCOUNTER — Encounter: Payer: Self-pay | Admitting: *Deleted

## 2017-07-08 DIAGNOSIS — M1990 Unspecified osteoarthritis, unspecified site: Secondary | ICD-10-CM

## 2017-07-08 MED ORDER — HYDROCODONE-ACETAMINOPHEN 5-325 MG PO TABS: 1.0000 | ORAL_TABLET | ORAL | 0 refills | Status: DC | PRN

## 2017-07-08 NOTE — Telephone Encounter (Signed)
Can you please send to [facility] electronically.   Patient will be in for lab work in April.  Will collect uds and get her to sign contract.

## 2017-07-08 NOTE — Telephone Encounter (Signed)
done

## 2017-07-20 NOTE — Telephone Encounter (Signed)
Kelly [name]- can you check on podiatry referral that was placed on 07/01/2017 please?

## 2017-07-20 NOTE — Telephone Encounter (Signed)
Copied from [place] [id number]. Topic: Referral - Status >> Jul 20, 2017  1:14 PM Neva Seat wrote: Pt is checking on the Pediatrist referral that was suppose to be taken care of for her. Please call pt to let her know.  She is in a lot of pain and needing to be seen for help.  Pt said to say thank you to all of the staff.  She thinks you all have always been nice to her.

## 2017-07-23 ENCOUNTER — Other Ambulatory Visit: Payer: Self-pay | Admitting: Family Medicine

## 2017-07-23 DIAGNOSIS — M792 Neuralgia and neuritis, unspecified: Secondary | ICD-10-CM

## 2017-07-26 ENCOUNTER — Ambulatory Visit: Payer: Self-pay | Admitting: Podiatry

## 2017-07-28 NOTE — Patient Instructions (Addendum)
Your procedure is scheduled on:  Tuesday, Feb 26  Enter through the Main Entrance of [facility] at: 11:30 am  Pick up the phone at the desk and dial [phone].  Call this number if you have problems the morning of surgery: [phone].  Remember: Do NOT eat food after midnight Monday.  Do NOT drink clear liquids (including water) after 7 am Tuesday.  Take these medicines the morning of surgery with a SIP OF WATER:  Gabapentin and synthroid  Stop herbal medications and supplements at this time.  Do NOT wear jewelry (body piercing), metal hair clips/bobby pins, make-up, or nail polish. Do NOT wear lotions, powders, or perfumes.  You may wear deoderant. Do NOT shave for 48 hours prior to surgery. Do NOT bring valuables to the hospital.  Leave suitcase in car.  After surgery it may be brought to your room.  For patients admitted to the hospital, checkout time is 11:00 AM the day of discharge. Have a responsible adult drive you home and stay with you for 24 hours after your procedure.  Home by Ambulance arranged by Social Worker Garth Bigness [phone].

## 2017-07-29 ENCOUNTER — Other Ambulatory Visit: Payer: Self-pay | Admitting: Obstetrics & Gynecology

## 2017-08-02 ENCOUNTER — Other Ambulatory Visit: Payer: Self-pay

## 2017-08-02 ENCOUNTER — Encounter (HOSPITAL_COMMUNITY)
Admission: RE | Admit: 2017-08-02 | Discharge: 2017-08-02 | Disposition: A | Discharge status: Home / Self Care | Payer: Medicare Other | Source: Ambulatory Visit | Attending: Obstetrics & Gynecology | Admitting: Obstetrics & Gynecology

## 2017-08-02 ENCOUNTER — Encounter (HOSPITAL_COMMUNITY): Payer: Self-pay

## 2017-08-02 DIAGNOSIS — Z01812 Encounter for preprocedural laboratory examination: Secondary | ICD-10-CM | POA: Diagnosis not present

## 2017-08-02 LAB — CBC
HCT: 36.6 % (ref 36.0–46.0)
HEMOGLOBIN: 11.4 g/dL — AB (ref 12.0–15.0)
MCH: 27.4 pg (ref 26.0–34.0)
MCHC: 31.1 g/dL (ref 30.0–36.0)
MCV: 88 fL (ref 78.0–100.0)
Platelets: 251 10*3/uL (ref 150–400)
RBC: 4.16 MIL/uL (ref 3.87–5.11)
RDW: 16.8 % — ABNORMAL HIGH (ref 11.5–15.5)
WBC: 12.4 10*3/uL — AB (ref 4.0–10.5)

## 2017-08-02 LAB — TYPE AND SCREEN
ABO/RH(D): A POS
ANTIBODY SCREEN: NEGATIVE

## 2017-08-02 LAB — BASIC METABOLIC PANEL
ANION GAP: 9 (ref 5–15)
BUN: 15 mg/dL (ref 6–20)
CALCIUM: 9.1 mg/dL (ref 8.9–10.3)
CO2: 26 mmol/L (ref 22–32)
Chloride: 103 mmol/L (ref 101–111)
Creatinine, Ser: 0.59 mg/dL (ref 0.44–1.00)
GFR calc non Af Amer: >60 mL/min (ref >60)
Glucose, Bld: 122 mg/dL — ABNORMAL HIGH (ref 65–99)
Potassium: 3.7 mmol/L (ref 3.5–5.1)
Sodium: 138 mmol/L (ref 135–145)

## 2017-08-02 NOTE — Pre-Procedure Instructions (Signed)
Reviewed 01/05/17 EKG with Dr. Royce Macadamia.  [place] for surgery. No orders given.

## 2017-08-02 NOTE — Pre-Procedure Instructions (Signed)
Patient stated during PAT appointment that she did not have a ride home after being d/c from the hospital.  Patient is having a Transvaginal Hysterectomy, Bilateral Salpingectomy with Dr. Roselie Awkward on Tuesday, 08/10/17.  Patient is scheduled for OBS overnight recovery and d/c home on Wednesday, 08/11/17.  Norris Cross, Specialty Coordinator for PACU spoke with Terri Piedra, Social Worker - cell [phone] who completed a medical necessity form so patient could be discharge home by ambulance.  Patient will be on the OB-GYN Specialty Care Unit (3rd floor).  On day of discharge, Patient's nurse is to call Terri Piedra for paperwork and arrange for ambulance to transport patient home after being discharge.

## 2017-08-04 ENCOUNTER — Ambulatory Visit (INDEPENDENT_AMBULATORY_CARE_PROVIDER_SITE_OTHER): Payer: Medicare Other | Admitting: Podiatry

## 2017-08-04 ENCOUNTER — Other Ambulatory Visit: Payer: Self-pay | Admitting: *Deleted

## 2017-08-04 ENCOUNTER — Encounter: Payer: Self-pay | Admitting: Podiatry

## 2017-08-04 VITALS — BP 128/62 | HR 86 | Ht 60.0 in | Wt 276.0 lb

## 2017-08-04 DIAGNOSIS — G8194 Hemiplegia, unspecified affecting left nondominant side: Secondary | ICD-10-CM | POA: Diagnosis not present

## 2017-08-04 DIAGNOSIS — L84 Corns and callosities: Secondary | ICD-10-CM

## 2017-08-04 DIAGNOSIS — Z9181 History of falling: Secondary | ICD-10-CM | POA: Diagnosis not present

## 2017-08-04 DIAGNOSIS — I10 Essential (primary) hypertension: Secondary | ICD-10-CM

## 2017-08-04 DIAGNOSIS — M24572 Contracture, left ankle: Secondary | ICD-10-CM

## 2017-08-04 MED ORDER — FUROSEMIDE 40 MG PO TABS: 40.0000 mg | ORAL_TABLET | Freq: Every day | ORAL | 1 refills | Status: DC
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
## 2017-08-06 DIAGNOSIS — H26493 Other secondary cataract, bilateral: Secondary | ICD-10-CM | POA: Diagnosis not present
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
## 2017-08-10 ENCOUNTER — Other Ambulatory Visit: Payer: Self-pay

## 2017-08-10 ENCOUNTER — Ambulatory Visit (HOSPITAL_COMMUNITY): Payer: Medicare Other | Admitting: Anesthesiology

## 2017-08-10 ENCOUNTER — Encounter (HOSPITAL_COMMUNITY)
Admission: RE | Disposition: A | Discharge status: Home / Self Care | Payer: Self-pay | Source: Ambulatory Visit | Attending: Obstetrics & Gynecology

## 2017-08-10 ENCOUNTER — Observation Stay (HOSPITAL_COMMUNITY)
Admission: RE | Admit: 2017-08-10 | Discharge: 2017-08-12 | Disposition: A | Discharge status: Home / Self Care | Payer: Medicare Other | Source: Ambulatory Visit | Attending: Obstetrics & Gynecology | Admitting: Obstetrics & Gynecology

## 2017-08-10 ENCOUNTER — Encounter (HOSPITAL_COMMUNITY): Payer: Self-pay

## 2017-08-10 DIAGNOSIS — D259 Leiomyoma of uterus, unspecified: Secondary | ICD-10-CM | POA: Diagnosis present

## 2017-08-10 DIAGNOSIS — Z7982 Long term (current) use of aspirin: Secondary | ICD-10-CM | POA: Diagnosis not present

## 2017-08-10 DIAGNOSIS — Z79818 Long term (current) use of other agents affecting estrogen receptors and estrogen levels: Secondary | ICD-10-CM | POA: Diagnosis not present

## 2017-08-10 DIAGNOSIS — Z79890 Hormone replacement therapy (postmenopausal): Secondary | ICD-10-CM | POA: Insufficient documentation

## 2017-08-10 DIAGNOSIS — N950 Postmenopausal bleeding: Secondary | ICD-10-CM | POA: Diagnosis not present

## 2017-08-10 DIAGNOSIS — E039 Hypothyroidism, unspecified: Secondary | ICD-10-CM | POA: Insufficient documentation

## 2017-08-10 DIAGNOSIS — Z96653 Presence of artificial knee joint, bilateral: Secondary | ICD-10-CM | POA: Insufficient documentation

## 2017-08-10 DIAGNOSIS — N800 Endometriosis of uterus: Principal | ICD-10-CM | POA: Insufficient documentation

## 2017-08-10 DIAGNOSIS — N879 Dysplasia of cervix uteri, unspecified: Secondary | ICD-10-CM | POA: Insufficient documentation

## 2017-08-10 DIAGNOSIS — Z86711 Personal history of pulmonary embolism: Secondary | ICD-10-CM | POA: Insufficient documentation

## 2017-08-10 DIAGNOSIS — Z79899 Other long term (current) drug therapy: Secondary | ICD-10-CM | POA: Insufficient documentation

## 2017-08-10 DIAGNOSIS — Z6841 Body Mass Index (BMI) 40.0 and over, adult: Secondary | ICD-10-CM | POA: Diagnosis not present

## 2017-08-10 DIAGNOSIS — E785 Hyperlipidemia, unspecified: Secondary | ICD-10-CM | POA: Diagnosis not present

## 2017-08-10 DIAGNOSIS — I10 Essential (primary) hypertension: Secondary | ICD-10-CM | POA: Insufficient documentation

## 2017-08-10 DIAGNOSIS — Z853 Personal history of malignant neoplasm of breast: Secondary | ICD-10-CM | POA: Insufficient documentation

## 2017-08-10 DIAGNOSIS — Z87891 Personal history of nicotine dependence: Secondary | ICD-10-CM | POA: Diagnosis not present

## 2017-08-10 DIAGNOSIS — N920 Excessive and frequent menstruation with regular cycle: Secondary | ICD-10-CM | POA: Diagnosis not present

## 2017-08-10 DIAGNOSIS — N939 Abnormal uterine and vaginal bleeding, unspecified: Secondary | ICD-10-CM

## 2017-08-10 HISTORY — PX: VAGINAL HYSTERECTOMY: SHX2639

## 2017-08-10 LAB — CBC
HCT: 34.2 % — ABNORMAL LOW (ref 36.0–46.0)
Hemoglobin: 10.5 g/dL — ABNORMAL LOW (ref 12.0–15.0)
MCH: 27.1 pg (ref 26.0–34.0)
MCHC: 30.7 g/dL (ref 30.0–36.0)
MCV: 88.1 fL (ref 78.0–100.0)
PLATELETS: 208 10*3/uL (ref 150–400)
RBC: 3.88 MIL/uL (ref 3.87–5.11)
RDW: 16.4 % — AB (ref 11.5–15.5)
WBC: 13.7 10*3/uL — AB (ref 4.0–10.5)

## 2017-08-10 LAB — CREATININE, SERUM: CREATININE: 0.6 mg/dL (ref 0.44–1.00)

## 2017-08-10 SURGERY — HYSTERECTOMY, VAGINAL
Anesthesia: General | Site: Vagina | Laterality: Bilateral

## 2017-08-10 MED ORDER — PROMETHAZINE HCL 25 MG/ML IJ SOLN: 6.2500 mg | INTRAMUSCULAR | Status: DC | PRN

## 2017-08-10 MED ORDER — ONDANSETRON HCL 4 MG/2ML IJ SOLN
4.0000 mg | Freq: Four times a day (QID) | INTRAMUSCULAR | Status: DC | PRN
  Administered 2017-08-10: 4 mg via INTRAVENOUS
  Filled 2017-08-10: qty 2

## 2017-08-10 MED ORDER — MIDAZOLAM HCL 2 MG/2ML IJ SOLN
INTRAMUSCULAR | Status: AC
Start: 2017-08-10 — End: ?
  Filled 2017-08-10: qty 2

## 2017-08-10 MED ORDER — CEFAZOLIN SODIUM-DEXTROSE 2-4 GM/100ML-% IV SOLN
INTRAVENOUS | Status: AC
  Filled 2017-08-10: qty 100

## 2017-08-10 MED ORDER — LACTATED RINGERS IV SOLN: INTRAVENOUS | Status: DC

## 2017-08-10 MED ORDER — PROPOFOL 10 MG/ML IV BOLUS
INTRAVENOUS | Status: AC
  Filled 2017-08-10: qty 40

## 2017-08-10 MED ORDER — DEXTROSE 5 % IV SOLN
3.0000 g | Freq: Once | INTRAVENOUS | Status: DC
  Filled 2017-08-10: qty 3000

## 2017-08-10 MED ORDER — ROCURONIUM BROMIDE 100 MG/10ML IV SOLN
INTRAVENOUS | Status: DC | PRN
  Administered 2017-08-10: 50 mg via INTRAVENOUS

## 2017-08-10 MED ORDER — CEFAZOLIN SODIUM-DEXTROSE 2-4 GM/100ML-% IV SOLN: 2.0000 g | INTRAVENOUS | Status: DC

## 2017-08-10 MED ORDER — ONDANSETRON HCL 4 MG/2ML IJ SOLN
INTRAMUSCULAR | Status: AC
  Filled 2017-08-10: qty 2

## 2017-08-10 MED ORDER — DEXAMETHASONE SODIUM PHOSPHATE 10 MG/ML IJ SOLN
INTRAMUSCULAR | Status: DC | PRN
  Administered 2017-08-10: 4 mg via INTRAVENOUS

## 2017-08-10 MED ORDER — FUROSEMIDE 10 MG/ML IJ SOLN
20.0000 mg | Freq: Once | INTRAMUSCULAR | Status: AC
  Administered 2017-08-10: 20 mg via INTRAVENOUS

## 2017-08-10 MED ORDER — SUGAMMADEX SODIUM 200 MG/2ML IV SOLN
INTRAVENOUS | Status: DC | PRN
  Administered 2017-08-10: 250.4 mg via INTRAVENOUS

## 2017-08-10 MED ORDER — OXYCODONE-ACETAMINOPHEN 5-325 MG PO TABS: 1.0000 | ORAL_TABLET | Freq: Four times a day (QID) | ORAL | Status: DC | PRN

## 2017-08-10 MED ORDER — ATORVASTATIN CALCIUM 20 MG PO TABS
20.0000 mg | ORAL_TABLET | Freq: Every day | ORAL | Status: DC
  Administered 2017-08-11 – 2017-08-12 (×2): 20 mg via ORAL
  Filled 2017-08-10 (×4): qty 1

## 2017-08-10 MED ORDER — HYDROMORPHONE HCL 1 MG/ML IJ SOLN
0.2500 mg | INTRAMUSCULAR | Status: DC | PRN
  Administered 2017-08-10 (×2): 0.5 mg via INTRAVENOUS

## 2017-08-10 MED ORDER — FENTANYL CITRATE (PF) 250 MCG/5ML IJ SOLN
INTRAMUSCULAR | Status: AC
Start: 2017-08-10 — End: ?
  Filled 2017-08-10: qty 5

## 2017-08-10 MED ORDER — FUROSEMIDE 10 MG/ML IJ SOLN
INTRAMUSCULAR | Status: AC
  Administered 2017-08-10: 20 mg via INTRAVENOUS
  Filled 2017-08-10: qty 2

## 2017-08-10 MED ORDER — LIDOCAINE HCL (CARDIAC) 20 MG/ML IV SOLN
INTRAVENOUS | Status: DC | PRN
  Administered 2017-08-10: 60 mg via INTRAVENOUS

## 2017-08-10 MED ORDER — FENTANYL CITRATE (PF) 250 MCG/5ML IJ SOLN
INTRAMUSCULAR | Status: AC
  Filled 2017-08-10: qty 5

## 2017-08-10 MED ORDER — ENOXAPARIN SODIUM 80 MG/0.8ML ~~LOC~~ SOLN
0.5000 mg/kg | SUBCUTANEOUS | Status: DC
  Administered 2017-08-11 – 2017-08-12 (×2): 65 mg via SUBCUTANEOUS
  Filled 2017-08-10 (×4): qty 0.8

## 2017-08-10 MED ORDER — FENTANYL CITRATE (PF) 100 MCG/2ML IJ SOLN
INTRAMUSCULAR | Status: DC | PRN
  Administered 2017-08-10 (×7): 50 ug via INTRAVENOUS

## 2017-08-10 MED ORDER — LACTATED RINGERS IV SOLN
INTRAVENOUS | Status: DC
  Administered 2017-08-10 (×2): via INTRAVENOUS

## 2017-08-10 MED ORDER — PROPOFOL 10 MG/ML IV BOLUS
INTRAVENOUS | Status: DC | PRN
  Administered 2017-08-10: 200 mg via INTRAVENOUS

## 2017-08-10 MED ORDER — LACTATED RINGERS IV SOLN
INTRAVENOUS | Status: DC
  Administered 2017-08-10: 17:00:00 via INTRAVENOUS
  Administered 2017-08-10: 125 mL/h via INTRAVENOUS

## 2017-08-10 MED ORDER — LIDOCAINE-EPINEPHRINE (PF) 1 %-1:200000 IJ SOLN
INTRAMUSCULAR | Status: DC | PRN
  Administered 2017-08-10: 20 mL

## 2017-08-10 MED ORDER — MIDAZOLAM HCL 2 MG/2ML IJ SOLN
INTRAMUSCULAR | Status: DC | PRN
  Administered 2017-08-10: 2 mg via INTRAVENOUS

## 2017-08-10 MED ORDER — ROCURONIUM BROMIDE 100 MG/10ML IV SOLN
INTRAVENOUS | Status: AC
  Filled 2017-08-10: qty 1

## 2017-08-10 MED ORDER — ESTRADIOL 0.1 MG/GM VA CREA
TOPICAL_CREAM | VAGINAL | Status: DC | PRN
  Administered 2017-08-10: 1 via VAGINAL

## 2017-08-10 MED ORDER — HYDROMORPHONE HCL 1 MG/ML IJ SOLN
INTRAMUSCULAR | Status: AC
  Administered 2017-08-10: 0.5 mg via INTRAVENOUS
  Filled 2017-08-10: qty 0.5

## 2017-08-10 MED ORDER — HYDROMORPHONE HCL 1 MG/ML IJ SOLN: 1.0000 mg | INTRAMUSCULAR | Status: DC | PRN

## 2017-08-10 MED ORDER — DEXAMETHASONE SODIUM PHOSPHATE 4 MG/ML IJ SOLN
INTRAMUSCULAR | Status: AC
  Filled 2017-08-10: qty 1

## 2017-08-10 MED ORDER — MEPERIDINE HCL 25 MG/ML IJ SOLN: 6.2500 mg | INTRAMUSCULAR | Status: DC | PRN

## 2017-08-10 MED ORDER — ACETAMINOPHEN 10 MG/ML IV SOLN
INTRAVENOUS | Status: AC
  Administered 2017-08-10: 1000 mg via INTRAVENOUS
  Filled 2017-08-10: qty 100

## 2017-08-10 MED ORDER — ACETAMINOPHEN 10 MG/ML IV SOLN
1000.0000 mg | Freq: Once | INTRAVENOUS | Status: DC | PRN
  Administered 2017-08-10: 1000 mg via INTRAVENOUS

## 2017-08-10 MED ORDER — ONDANSETRON HCL 4 MG/2ML IJ SOLN
INTRAMUSCULAR | Status: DC | PRN
  Administered 2017-08-10: 4 mg via INTRAVENOUS

## 2017-08-10 MED ORDER — CEFAZOLIN SODIUM 10 G IJ SOLR
3.0000 g | INTRAMUSCULAR | Status: AC
  Administered 2017-08-10: 3 g via INTRAVENOUS
  Administered 2017-08-10: 2 g via INTRAVENOUS
  Filled 2017-08-10: qty 3000

## 2017-08-10 MED ORDER — SUGAMMADEX SODIUM 200 MG/2ML IV SOLN
INTRAVENOUS | Status: AC
  Filled 2017-08-10: qty 2

## 2017-08-10 MED ORDER — LEVOTHYROXINE SODIUM 75 MCG PO TABS
75.0000 ug | ORAL_TABLET | Freq: Every day | ORAL | Status: DC
  Administered 2017-08-11 – 2017-08-12 (×2): 75 ug via ORAL
  Filled 2017-08-10 (×4): qty 1

## 2017-08-10 MED ORDER — LIDOCAINE-EPINEPHRINE (PF) 1 %-1:200000 IJ SOLN
INTRAMUSCULAR | Status: AC
  Filled 2017-08-10: qty 30

## 2017-08-10 MED ORDER — HYDROMORPHONE HCL 1 MG/ML IJ SOLN
INTRAMUSCULAR | Status: AC
  Filled 2017-08-10: qty 0.5

## 2017-08-10 MED ORDER — ESTRADIOL 0.1 MG/GM VA CREA
TOPICAL_CREAM | VAGINAL | Status: AC
  Filled 2017-08-10: qty 42.5

## 2017-08-10 MED ORDER — ONDANSETRON HCL 4 MG PO TABS: 4.0000 mg | ORAL_TABLET | Freq: Four times a day (QID) | ORAL | Status: DC | PRN

## 2017-08-10 SURGICAL SUPPLY — 21 items
GAUZE PACKING 2X5 YD STRL (GAUZE/BANDAGES/DRESSINGS) ×2 IMPLANT
GLOVE BIO SURGEON STRL SZ 6.5 (GLOVE) ×2 IMPLANT
GLOVE BIO SURGEONS STRL SZ 6.5 (GLOVE) ×1
GLOVE BIOGEL PI IND STRL 7.0 (GLOVE) ×2 IMPLANT
GLOVE BIOGEL PI INDICATOR 7.0 (GLOVE) ×6
GLOVE ECLIPSE 6.0 STRL STRAW (GLOVE) ×3 IMPLANT
GOWN STRL REUS W/TWL LRG LVL3 (GOWN DISPOSABLE) ×12 IMPLANT
NS IRRIG 1000ML POUR BTL (IV SOLUTION) ×3 IMPLANT
PACK TRENDGUARD 600 HYBRD PROC (MISCELLANEOUS) IMPLANT
PACK VAGINAL WOMENS (CUSTOM PROCEDURE TRAY) ×3 IMPLANT
PAD OB MATERNITY 4.3X12.25 (PERSONAL CARE ITEMS) ×3 IMPLANT
SHEARS FOC LG CVD HARMONIC 17C (MISCELLANEOUS) IMPLANT
SUT VIC AB 0 CT1 18XCR BRD8 (SUTURE) ×2 IMPLANT
SUT VIC AB 0 CT1 36 (SUTURE) ×3 IMPLANT
SUT VIC AB 0 CT1 8-18 (SUTURE) ×6
SUT VIC AB 2-0 CT1 27 (SUTURE) ×6
SUT VIC AB 2-0 CT1 TAPERPNT 27 (SUTURE) ×2 IMPLANT
SUT VICRYL 0 TIES 12 18 (SUTURE) ×3 IMPLANT
TOWEL OR 17X24 6PK STRL BLUE (TOWEL DISPOSABLE) ×6 IMPLANT
TRAY FOLEY CATH SILVER 14FR (SET/KITS/TRAYS/PACK) ×3 IMPLANT
TRENDGUARD 600 HYBRID PROC PK (MISCELLANEOUS)

## 2017-08-10 NOTE — Transfer of Care (Signed)
Immediate Anesthesia Transfer of Care Note  Patient: Kelly [name]  Procedure(s) Performed: HYSTERECTOMY VAGINAL WITH SALPINGECTOMY (Bilateral Vagina )  Patient Location: PACU  Anesthesia Type:General  Level of Consciousness: awake, alert  and oriented  Airway & Oxygen Therapy: Patient Spontanous Breathing and Patient connected to nasal cannula oxygen  Post-op Assessment: Report given to RN, Post -op Vital signs reviewed and stable and Patient moving all extremities  Post vital signs: Reviewed and stable  Last Vitals:  Vitals:   08/10/17 1204  BP: (!) 130/55  Pulse: 74  Resp: 16  Temp: 37.6 C  SpO2: 97%    Last Pain:  Vitals:   08/10/17 1204  TempSrc: Oral      Patients Stated Pain Goal: 2 (07/46/00 2984)  Complications: No apparent anesthesia complications

## 2017-08-10 NOTE — Op Note (Signed)
Kelly [name] PROCEDURE DATE: 08/10/2017  PREOPERATIVE DIAGNOSIS:  Symptomatic fibroids, menorrhagia POSTOPERATIVE DIAGNOSIS:  Symptomatic fibroids, menorrhagia SURGEON:   Woodroe Mode, MD ASSISTANT: Darron Doom, M.D. OPERATION:  Total Vaginal hysterectomy ANESTHESIA:  General endotracheal.  INDICATIONS: The patient is a 67 y.o. G1P1001 with history of symptomatic postmenopausal bleeding The patient made a decision to undergo definite surgical treatment. On the preoperative visit, the risks, benefits, indications, and alternatives of the procedure were reviewed with the patient.  On the day of surgery, the risks of surgery were again discussed with the patient including but not limited to: bleeding which may require transfusion or reoperation; infection which may require antibiotics; injury to bowel, bladder, ureters or other surrounding organs; need for additional procedures; thromboembolic phenomenon, incisional problems and other postoperative/anesthesia complications. Written informed consent was obtained.    OPERATIVE FINDINGS: A 8 week size uterus with normal tubes and ovaries bilaterally.  ESTIMATED BLOOD LOSS: 750 ml FLUIDS:  1800 ml of Lactated Ringers URINE OUTPUT:  75 ml of clear yellow urine. SPECIMENS:  Uterus and cervix sent to pathology COMPLICATIONS:  None immediate.  DESCRIPTION OF PROCEDURE:  The patient received intravenous antibiotics and had sequential compression devices applied to her lower extremities while in the preoperative area.  She was then taken to the operating room where general anesthesia was administered and was found to be adequate.  She was placed in the dorsal lithotomy position, and was prepped and draped in a sterile manner.  A Foley catheter was inserted into her bladder and attached to constant drainage. After an adequate timeout was performed, attention was turned to her pelvis.  A weighted speculum was then placed in the vagina, and the anterior and  posterior lips of the cervix were grasped bilaterally with tenaculums.  The cervix was then injected circumferentially with 1% lidocaine with epinephrine solution to maintain hemostasis.  The cervix was then circumferentially incised, and the bladder was dissected off the pubocervical fascia  without complication.  The posterior cul-de-sac was entered sharply without difficulty.  A long weighted speculum was inserted into the posterior cul-de-sac.  The Heaney clamp was then used to clamp the uterosacral ligaments on either side.  They were then cut and sutured ligated with 0 Vicryl, and the ligated uterosacral ligaments were transfixed to the ipsilateral vaginal epithelium to further support the vagina and provide hemostasis. Of note, all sutures used in this case were 0 Vicryl unless otherwise noted.   The cardinal ligaments were then clamped, cut and ligated. There was very little descent of the uterus due to an anterior subserosal fibroidThe uterine vessels and broad ligaments were then serially clamped with the Heaney clamps, cut, and suture ligated on both sides.    The uterus was then delivered via the posterior cul-de-sac until the fundus could be morcellated, and the cornua were clamped with the Heaney clamps, transected, and the uterus was delivered and sent to pathology. These pedicles were then suture ligated to ensure hemostasis.  After completion of the hysterectomy, all pedicles from the uterosacral ligament to the cornua were examined hemostasis was confirmed.  The vaginal cuff was then closed with a in a running locked fashion with care given to incorporate the uterosacral pedicles bilaterally.  All instruments were then removed from the pelvis and a vaginal packing saturated with estrogen cream was placed.  The patient tolerated the procedure well.  All instruments, needles, and sponge counts were correct x 2. The patient was taken to the recovery room in  stable condition.    Woodroe Mode,  MD Attending [place], [facility]

## 2017-08-10 NOTE — Anesthesia Postprocedure Evaluation (Signed)
Anesthesia Post Note  Patient: Kelly [name]  Procedure(s) Performed: HYSTERECTOMY VAGINAL (Bilateral Vagina )     Patient location during evaluation: PACU Anesthesia Type: General Level of consciousness: awake Pain management: pain level controlled Vital Signs Assessment: post-procedure vital signs reviewed and stable Respiratory status: spontaneous breathing Cardiovascular status: stable Postop Assessment: no apparent nausea or vomiting Anesthetic complications: no    Last Vitals:  Vitals:   08/10/17 1715 08/10/17 1730  BP: (!) 147/71 134/72  Pulse: 88 87  Resp: 18 15  Temp:    SpO2: 96% 95%    Last Pain:  Vitals:   08/10/17 1730  TempSrc:   PainSc: Asleep   Pain Goal: Patients Stated Pain Goal: 2 (08/10/17 1204)               [place]

## 2017-08-10 NOTE — Anesthesia Preprocedure Evaluation (Signed)
Anesthesia Evaluation  Patient identified by MRN, date of birth, ID band Patient awake    Reviewed: Allergy & Precautions, NPO status , Patient's Chart, lab work & pertinent test results  Airway Mallampati: II       Dental  (+) Poor Dentition,    Pulmonary former smoker,    Pulmonary exam normal breath sounds clear to auscultation       Cardiovascular hypertension, Pt. on medications Normal cardiovascular exam Rhythm:Regular Rate:Normal     Neuro/Psych    GI/Hepatic Neg liver ROS,   Endo/Other  Hypothyroidism Morbid obesity  Renal/GU negative Renal ROS  negative genitourinary   Musculoskeletal  (+) Arthritis , Osteoarthritis,    Abdominal (+) + obese,   Peds  Hematology negative hematology ROS (+)   Anesthesia Other Findings   Reproductive/Obstetrics                             Anesthesia Physical Anesthesia Plan  ASA: III  Anesthesia Plan: General   Post-op Pain Management:    Induction: Intravenous  PONV Risk Score and Plan: 4 or greater and Ondansetron, Dexamethasone, Scopolamine patch - Pre-op and Midazolam  Airway Management Planned: Oral ETT and Video Laryngoscope Planned  Additional Equipment:   Intra-op Plan:   Post-operative Plan: Extubation in OR  Informed Consent: I have reviewed the patients History and Physical, chart, labs and discussed the procedure including the risks, benefits and alternatives for the proposed anesthesia with the patient or authorized representative who has indicated his/her understanding and acceptance.   Dental advisory given  Plan Discussed with: CRNA and Surgeon  Anesthesia Plan Comments:         Anesthesia Quick Evaluation

## 2017-08-10 NOTE — OR Nursing (Signed)
Family updated by phone per Dr. Roselie Awkward
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
## 2017-08-10 NOTE — Anesthesia Procedure Notes (Signed)
Procedure Name: Intubation Date/Time: 08/10/2017 1:21 PM Performed by: Jonna Munro, CRNA Pre-anesthesia Checklist: Patient identified, Emergency Drugs available, Suction available, Patient being monitored and Timeout performed Patient Re-evaluated:Patient Re-evaluated prior to induction Oxygen Delivery Method: Circle system utilized Preoxygenation: Pre-oxygenation with 100% oxygen Induction Type: IV induction Ventilation: Oral airway inserted - appropriate to patient size and Mask ventilation without difficulty Grade View: Grade I Tube type: Oral Tube size: 7.0 mm Number of attempts: 1 Airway Equipment and Method: Stylet Placement Confirmation: ETT inserted through vocal cords under direct vision,  positive ETCO2 and breath sounds checked- equal and bilateral Secured at: 22 cm Tube secured with: Tape Dental Injury: Teeth and Oropharynx as per pre-operative assessment

## 2017-08-11 ENCOUNTER — Encounter (HOSPITAL_COMMUNITY): Payer: Self-pay

## 2017-08-11 ENCOUNTER — Encounter (HOSPITAL_COMMUNITY): Payer: Self-pay | Admitting: Obstetrics & Gynecology

## 2017-08-11 DIAGNOSIS — Z96653 Presence of artificial knee joint, bilateral: Secondary | ICD-10-CM | POA: Diagnosis not present

## 2017-08-11 DIAGNOSIS — N950 Postmenopausal bleeding: Secondary | ICD-10-CM | POA: Diagnosis not present

## 2017-08-11 DIAGNOSIS — Z79899 Other long term (current) drug therapy: Secondary | ICD-10-CM | POA: Diagnosis not present

## 2017-08-11 DIAGNOSIS — Z7982 Long term (current) use of aspirin: Secondary | ICD-10-CM | POA: Diagnosis not present

## 2017-08-11 DIAGNOSIS — N800 Endometriosis of uterus: Secondary | ICD-10-CM | POA: Diagnosis not present

## 2017-08-11 DIAGNOSIS — Z87891 Personal history of nicotine dependence: Secondary | ICD-10-CM | POA: Diagnosis not present

## 2017-08-11 DIAGNOSIS — Z79890 Hormone replacement therapy (postmenopausal): Secondary | ICD-10-CM | POA: Diagnosis not present

## 2017-08-11 DIAGNOSIS — Z86711 Personal history of pulmonary embolism: Secondary | ICD-10-CM | POA: Diagnosis not present

## 2017-08-11 DIAGNOSIS — Z79818 Long term (current) use of other agents affecting estrogen receptors and estrogen levels: Secondary | ICD-10-CM | POA: Diagnosis not present

## 2017-08-11 DIAGNOSIS — Z853 Personal history of malignant neoplasm of breast: Secondary | ICD-10-CM | POA: Diagnosis not present

## 2017-08-11 DIAGNOSIS — I10 Essential (primary) hypertension: Secondary | ICD-10-CM | POA: Diagnosis not present

## 2017-08-11 DIAGNOSIS — N879 Dysplasia of cervix uteri, unspecified: Secondary | ICD-10-CM | POA: Diagnosis not present

## 2017-08-11 DIAGNOSIS — E039 Hypothyroidism, unspecified: Secondary | ICD-10-CM | POA: Diagnosis not present

## 2017-08-11 DIAGNOSIS — E785 Hyperlipidemia, unspecified: Secondary | ICD-10-CM | POA: Diagnosis not present

## 2017-08-11 LAB — CBC
HCT: 29.8 % — ABNORMAL LOW (ref 36.0–46.0)
Hemoglobin: 9.3 g/dL — ABNORMAL LOW (ref 12.0–15.0)
MCH: 27.3 pg (ref 26.0–34.0)
MCHC: 31.2 g/dL (ref 30.0–36.0)
MCV: 87.4 fL (ref 78.0–100.0)
PLATELETS: 226 10*3/uL (ref 150–400)
RBC: 3.41 MIL/uL — ABNORMAL LOW (ref 3.87–5.11)
RDW: 16.2 % — AB (ref 11.5–15.5)
WBC: 12.5 10*3/uL — ABNORMAL HIGH (ref 4.0–10.5)

## 2017-08-11 LAB — BASIC METABOLIC PANEL
Anion gap: 8 (ref 5–15)
BUN: 14 mg/dL (ref 6–20)
CALCIUM: 8.1 mg/dL — AB (ref 8.9–10.3)
CO2: 23 mmol/L (ref 22–32)
Chloride: 109 mmol/L (ref 101–111)
Creatinine, Ser: 0.63 mg/dL (ref 0.44–1.00)
GFR calc Af Amer: >60 mL/min (ref >60)
Glucose, Bld: 158 mg/dL — ABNORMAL HIGH (ref 65–99)
POTASSIUM: 4.1 mmol/L (ref 3.5–5.1)
Sodium: 140 mmol/L (ref 135–145)

## 2017-08-11 MED ORDER — FUROSEMIDE 40 MG PO TABS
40.0000 mg | ORAL_TABLET | Freq: Every day | ORAL | Status: DC
  Administered 2017-08-11: 40 mg via ORAL
  Filled 2017-08-11 (×3): qty 1

## 2017-08-11 MED ORDER — GABAPENTIN 100 MG PO CAPS
100.0000 mg | ORAL_CAPSULE | Freq: Two times a day (BID) | ORAL | Status: DC
  Administered 2017-08-11 (×2): 100 mg via ORAL
  Filled 2017-08-11 (×6): qty 1

## 2017-08-11 MED ORDER — IBUPROFEN 600 MG PO TABS
600.0000 mg | ORAL_TABLET | Freq: Once | ORAL | Status: AC
  Administered 2017-08-11: 600 mg via ORAL
  Filled 2017-08-11: qty 1

## 2017-08-11 MED ORDER — BENAZEPRIL HCL 10 MG PO TABS
10.0000 mg | ORAL_TABLET | Freq: Every day | ORAL | Status: DC
  Administered 2017-08-11 – 2017-08-12 (×2): 10 mg via ORAL
  Filled 2017-08-11 (×3): qty 1

## 2017-08-11 MED ORDER — ASPIRIN 81 MG PO TBEC
81.0000 mg | DELAYED_RELEASE_TABLET | Freq: Every day | ORAL | Status: DC
  Administered 2017-08-11 – 2017-08-12 (×2): 81 mg via ORAL
  Filled 2017-08-11 (×3): qty 1

## 2017-08-11 NOTE — Addendum Note (Signed)
Addendum  created 08/11/17 0759 by Georgeanne Nim, CRNA   Charge Capture section accepted, Sign clinical note

## 2017-08-11 NOTE — Progress Notes (Signed)
PT Note Spoke with RN, she reports patient's family is bringing in equipment and PT order cancelled. Thank you, 08/11/2017 [place], [place]
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
## 2017-08-11 NOTE — Progress Notes (Signed)
1 Day Post-Op Procedure(s) (LRB): HYSTERECTOMY VAGINAL (Bilateral)  Subjective: Patient reports nausea, tolerating PO and + flatus.    Objective: I have reviewed patient's vital signs, intake and output, medications and labs.  General: alert, cooperative and no distress Resp: normal effort no distress GI: soft, non-tender; bowel sounds normal; no masses,  no organomegaly Extremities: extremities normal, atraumatic, no cyanosis or edema Vaginal Bleeding: vaginal pack removed light staining CBC    Component Value Date/Time   WBC 12.5 (H) 08/11/2017 0508   RBC 3.41 (L) 08/11/2017 0508   HGB 9.3 (L) 08/11/2017 0508   HGB 12.4 11/07/2009 0846   HCT 29.8 (L) 08/11/2017 0508   HCT 37.1 11/07/2009 0846   PLT 226 08/11/2017 0508   PLT 209 11/07/2009 0846   MCV 87.4 08/11/2017 0508   MCV 82.9 11/07/2009 0846   MCH 27.3 08/11/2017 0508   MCHC 31.2 08/11/2017 0508   RDW 16.2 (H) 08/11/2017 0508   RDW 15.9 (H) 11/07/2009 0846   LYMPHSABS 2.5 07/01/2017 1435   LYMPHSABS 1.7 11/07/2009 0846   MONOABS 0.6 07/01/2017 1435   MONOABS 0.3 11/07/2009 0846   EOSABS 0.2 07/01/2017 1435   EOSABS 0.1 11/07/2009 0846   BASOSABS 0.1 07/01/2017 1435   BASOSABS 0.0 11/07/2009 0846   BMP Latest Ref Rng & Units 08/11/2017 08/10/2017 08/02/2017  Glucose 65 - 99 mg/dL 158(H) - 122(H)  BUN 6 - 20 mg/dL 14 - 15  Creatinine 0.44 - 1.00 mg/dL 0.63 0.60 0.59  Sodium 135 - 145 mmol/L 140 - 138  Potassium 3.5 - 5.1 mmol/L 4.1 - 3.7  Chloride 101 - 111 mmol/L 109 - 103  CO2 22 - 32 mmol/L 23 - 26  Calcium 8.9 - 10.3 mg/dL 8.1(L) - 9.1    Intake/Output Summary (Last 24 hours) at 08/11/2017 0741 Last data filed at 08/11/2017 0500 Gross per 24 hour  Intake 2840 ml  Output 2450 ml  Net 390 ml    Assessment: s/p Procedure(s): HYSTERECTOMY VAGINAL (Bilateral): stable and progressing well  Plan: Encourage ambulation Has little home support, anticipate discharge tomorrow  LOS: 0 days    Kelly [name] 08/11/2017, 7:39 AM

## 2017-08-11 NOTE — Anesthesia Postprocedure Evaluation (Signed)
Anesthesia Post Note  Patient: Kelly [name]  Procedure(s) Performed: HYSTERECTOMY VAGINAL (Bilateral Vagina )     Patient location during evaluation: Mother Baby Anesthesia Type: General Level of consciousness: awake Pain management: pain level controlled Vital Signs Assessment: post-procedure vital signs reviewed and stable Respiratory status: spontaneous breathing Cardiovascular status: stable Postop Assessment: epidural receding and patient able to bend at knees Anesthetic complications: no    Last Vitals:  Vitals:   08/10/17 2014 08/11/17 0531  BP: 128/67 (!) 115/53  Pulse: 78 88  Resp:  18  Temp: 36.7 C 36.9 C  SpO2: 98% 100%    Last Pain:  Vitals:   08/11/17 0437  TempSrc:   PainSc: Asleep   Pain Goal: Patients Stated Pain Goal: 3 (08/11/17 0234)               Everette Rank

## 2017-08-11 NOTE — Plan of Care (Signed)
  Education: Knowledge of the prescribed therapeutic regimen will improve 08/11/2017 0509 - Progressing by Lars Masson, RN   Self-Concept: Communication of feelings regarding changes in body function or appearance will improve 08/11/2017 0509 - Progressing by Lars Masson, RN   Skin Integrity: Demonstration of wound healing without infection will improve 08/11/2017 0509 - Progressing by Lars Masson, RN   Activity: Risk for activity intolerance will decrease 08/11/2017 0509 - Progressing by Lars Masson, RN

## 2017-08-11 NOTE — Care Management (Signed)
Addendum: referral for Hemi Walker called and spoke to [facility] with [facility] # [phone].  Demographics correct in Epic.
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
## 2017-08-12 DIAGNOSIS — Z79890 Hormone replacement therapy (postmenopausal): Secondary | ICD-10-CM | POA: Diagnosis not present

## 2017-08-12 DIAGNOSIS — Z96653 Presence of artificial knee joint, bilateral: Secondary | ICD-10-CM | POA: Diagnosis not present

## 2017-08-12 DIAGNOSIS — R600 Localized edema: Secondary | ICD-10-CM | POA: Diagnosis not present

## 2017-08-12 DIAGNOSIS — Z79899 Other long term (current) drug therapy: Secondary | ICD-10-CM | POA: Diagnosis not present

## 2017-08-12 DIAGNOSIS — Z79818 Long term (current) use of other agents affecting estrogen receptors and estrogen levels: Secondary | ICD-10-CM | POA: Diagnosis not present

## 2017-08-12 DIAGNOSIS — M1990 Unspecified osteoarthritis, unspecified site: Secondary | ICD-10-CM | POA: Diagnosis not present

## 2017-08-12 DIAGNOSIS — N879 Dysplasia of cervix uteri, unspecified: Secondary | ICD-10-CM | POA: Diagnosis not present

## 2017-08-12 DIAGNOSIS — E785 Hyperlipidemia, unspecified: Secondary | ICD-10-CM | POA: Diagnosis not present

## 2017-08-12 DIAGNOSIS — Z86711 Personal history of pulmonary embolism: Secondary | ICD-10-CM | POA: Diagnosis not present

## 2017-08-12 DIAGNOSIS — M810 Age-related osteoporosis without current pathological fracture: Secondary | ICD-10-CM | POA: Diagnosis not present

## 2017-08-12 DIAGNOSIS — E039 Hypothyroidism, unspecified: Secondary | ICD-10-CM | POA: Diagnosis not present

## 2017-08-12 DIAGNOSIS — Z853 Personal history of malignant neoplasm of breast: Secondary | ICD-10-CM | POA: Diagnosis not present

## 2017-08-12 DIAGNOSIS — I10 Essential (primary) hypertension: Secondary | ICD-10-CM | POA: Diagnosis not present

## 2017-08-12 DIAGNOSIS — Z87891 Personal history of nicotine dependence: Secondary | ICD-10-CM | POA: Diagnosis not present

## 2017-08-12 DIAGNOSIS — R609 Edema, unspecified: Secondary | ICD-10-CM | POA: Diagnosis not present

## 2017-08-12 DIAGNOSIS — Z7982 Long term (current) use of aspirin: Secondary | ICD-10-CM | POA: Diagnosis not present

## 2017-08-12 DIAGNOSIS — D126 Benign neoplasm of colon, unspecified: Secondary | ICD-10-CM | POA: Diagnosis not present

## 2017-08-12 DIAGNOSIS — N800 Endometriosis of uterus: Secondary | ICD-10-CM | POA: Diagnosis not present

## 2017-08-12 DIAGNOSIS — N950 Postmenopausal bleeding: Secondary | ICD-10-CM | POA: Diagnosis not present

## 2017-08-12 DIAGNOSIS — R100 Acute abdomen: Secondary | ICD-10-CM | POA: Diagnosis not present
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
## 2017-08-12 NOTE — Discharge Instructions (Signed)
Vaginal Hysterectomy, Care After °Refer to this sheet in the next few weeks. These instructions provide you with information about caring for yourself after your procedure. Your health care provider may also give you more specific instructions. Your treatment has been planned according to current medical practices, but problems sometimes occur. Call your health care provider if you have any problems or questions after your procedure. °What can I expect after the procedure? °After the procedure, it is common to have: °· Pain. °· Soreness and numbness in your incision areas. °· Vaginal bleeding and discharge. °· Constipation. °· Temporary problems emptying the bladder. °· Feelings of sadness or other emotions. ° °Follow these instructions at home: °Medicines °· Take over-the-counter and prescription medicines only as told by your health care provider. °· If you were prescribed an antibiotic medicine, take it as told by your health care provider. Do not stop taking the antibiotic even if you start to feel better. °· Do not drive or operate heavy machinery while taking prescription pain medicine. °Activity °· Return to your normal activities as told by your health care provider. Ask your health care provider what activities are safe for you. °· Get regular exercise as told by your health care provider. You may be told to take short walks every day and go farther each time. °· Do not lift anything that is heavier than 10 lb (4.5 kg). °General instructions ° °· Do not put anything in your vagina for 6 weeks after your surgery or as told by your health care provider. This includes tampons and douches. °· Do not have sex until your health care provider says you can. °· Do not take baths, swim, or use a hot tub until your health care provider approves. °· Drink enough fluid to keep your urine clear or pale yellow. °· Do not drive for 24 hours if you were given a sedative. °· Keep all follow-up visits as told by your health  care provider. This is important. °Contact a health care provider if: °· Your pain medicine is not helping. °· You have a fever. °· You have redness, swelling, or pain at your incision site. °· You have blood, pus, or a bad-smelling discharge from your vagina. °· You continue to have difficulty urinating. °Get help right away if: °· You have severe abdominal or back pain. °· You have heavy bleeding from your vagina. °· You have chest pain or shortness of breath. °This information is not intended to replace advice given to you by your health care provider. Make sure you discuss any questions you have with your health care provider. °Document Released: 09/23/2015 Document Revised: 11/07/2015 Document Reviewed: 06/16/2015 °Elsevier Interactive Patient Education © 2018 [employer] ° °

## 2017-08-12 NOTE — Progress Notes (Signed)
Discharge instructions given: Recovering from surgery pamphlet reviewed with patient, patient stating that she has all the things that she needs at home and it will be easier to get around. S/s infection, follow-up appointment and medication reviewed and patient verbalizes understanding. [facility]15 EMS here to pick up patient and pt moved to stretcher and d/cd home with no questions.

## 2017-08-13 ENCOUNTER — Encounter: Payer: Medicare Other | Admitting: Family Medicine

## 2017-08-28 ENCOUNTER — Other Ambulatory Visit: Payer: Self-pay | Admitting: Obstetrics & Gynecology

## 2017-08-28 DIAGNOSIS — N938 Other specified abnormal uterine and vaginal bleeding: Secondary | ICD-10-CM

## 2017-09-01 ENCOUNTER — Other Ambulatory Visit: Payer: Self-pay | Admitting: Obstetrics & Gynecology

## 2017-09-01 DIAGNOSIS — N938 Other specified abnormal uterine and vaginal bleeding: Secondary | ICD-10-CM

## 2017-09-02 ENCOUNTER — Telehealth: Payer: Self-pay | Admitting: General Practice

## 2017-09-02 NOTE — Telephone Encounter (Signed)
Patient's pharmacy called and left message on nurse line stating the patient had requested a refill on megace and it was denied. Pharmacy would like to know why. Called the pharmacy and explained the patient had a hysterectomy so the medication isn't needed any longer. They verbalized understanding & had no other questions

## 2017-09-16 ENCOUNTER — Ambulatory Visit (INDEPENDENT_AMBULATORY_CARE_PROVIDER_SITE_OTHER): Payer: Medicare Other | Admitting: Obstetrics & Gynecology

## 2017-09-16 ENCOUNTER — Encounter: Payer: Self-pay | Admitting: Obstetrics & Gynecology

## 2017-09-16 VITALS — BP 105/57 | HR 96 | Ht 60.0 in | Wt 272.0 lb

## 2017-09-16 DIAGNOSIS — Z9889 Other specified postprocedural states: Secondary | ICD-10-CM

## 2017-09-16 DIAGNOSIS — Z09 Encounter for follow-up examination after completed treatment for conditions other than malignant neoplasm: Secondary | ICD-10-CM

## 2017-09-16 NOTE — Patient Instructions (Signed)
Vaginal Hysterectomy, Care After °Refer to this sheet in the next few weeks. These instructions provide you with information about caring for yourself after your procedure. Your health care provider may also give you more specific instructions. Your treatment has been planned according to current medical practices, but problems sometimes occur. Call your health care provider if you have any problems or questions after your procedure. °What can I expect after the procedure? °After the procedure, it is common to have: °· Pain. °· Soreness and numbness in your incision areas. °· Vaginal bleeding and discharge. °· Constipation. °· Temporary problems emptying the bladder. °· Feelings of sadness or other emotions. ° °Follow these instructions at home: °Medicines °· Take over-the-counter and prescription medicines only as told by your health care provider. °· If you were prescribed an antibiotic medicine, take it as told by your health care provider. Do not stop taking the antibiotic even if you start to feel better. °· Do not drive or operate heavy machinery while taking prescription pain medicine. °Activity °· Return to your normal activities as told by your health care provider. Ask your health care provider what activities are safe for you. °· Get regular exercise as told by your health care provider. You may be told to take short walks every day and go farther each time. °· Do not lift anything that is heavier than 10 lb (4.5 kg). °General instructions ° °· Do not put anything in your vagina for 6 weeks after your surgery or as told by your health care provider. This includes tampons and douches. °· Do not have sex until your health care provider says you can. °· Do not take baths, swim, or use a hot tub until your health care provider approves. °· Drink enough fluid to keep your urine clear or pale yellow. °· Do not drive for 24 hours if you were given a sedative. °· Keep all follow-up visits as told by your health  care provider. This is important. °Contact a health care provider if: °· Your pain medicine is not helping. °· You have a fever. °· You have redness, swelling, or pain at your incision site. °· You have blood, pus, or a bad-smelling discharge from your vagina. °· You continue to have difficulty urinating. °Get help right away if: °· You have severe abdominal or back pain. °· You have heavy bleeding from your vagina. °· You have chest pain or shortness of breath. °This information is not intended to replace advice given to you by your health care provider. Make sure you discuss any questions you have with your health care provider. °Document Released: 09/23/2015 Document Revised: 11/07/2015 Document Reviewed: 06/16/2015 °Elsevier Interactive Patient Education © 2018 Elsevier Inc. ° °

## 2017-09-16 NOTE — Progress Notes (Signed)
Subjective:doing well     Kelly [name] is a 67 y.o. female who presents to the clinic 4 weeks status post TVH for abnormal uterine bleeding. Eating a regular diet without difficulty. Bowel movements are normal. The patient is not having any pain.  The following portions of the patient's history were reviewed and updated as appropriate: allergies, current medications, past family history, past medical history, past social history, past surgical history and problem list.  Review of Systems Pertinent items are noted in HPI.    Objective:    BP (!) 105/57   Pulse 96   Ht 5' (1.524 m)   Wt 272 lb (123.4 kg)   BMI 53.12 kg/m  General:  alert, cooperative and no distress  Abdomen: soft, non-tender        Assessment:    Doing well postoperatively. Operative findings again reviewed. Pathology report discussed.    Plan:    1. Continue any current medications. 2. Wound care discussed. 3. Activity restrictions: none 4. Anticipated return to work: not applicable. 5. Follow up: prn routine care.   Woodroe Mode, MD 09/17/2017

## 2017-09-17 ENCOUNTER — Other Ambulatory Visit: Payer: Self-pay | Admitting: Family Medicine

## 2017-09-17 DIAGNOSIS — M792 Neuralgia and neuritis, unspecified: Secondary | ICD-10-CM

## 2017-09-17 MED ORDER — IBUPROFEN 600 MG PO TABS: 600.0000 mg | ORAL_TABLET | Freq: Four times a day (QID) | ORAL | 1 refills | Status: DC | PRN

## 2017-09-22 ENCOUNTER — Other Ambulatory Visit: Payer: Self-pay | Admitting: Family Medicine

## 2017-09-22 DIAGNOSIS — M792 Neuralgia and neuritis, unspecified: Secondary | ICD-10-CM

## 2017-10-04 ENCOUNTER — Other Ambulatory Visit (INDEPENDENT_AMBULATORY_CARE_PROVIDER_SITE_OTHER): Payer: Medicare Other

## 2017-10-04 DIAGNOSIS — Z79899 Other long term (current) drug therapy: Secondary | ICD-10-CM

## 2017-10-04 DIAGNOSIS — M546 Pain in thoracic spine: Secondary | ICD-10-CM | POA: Diagnosis not present

## 2017-10-04 DIAGNOSIS — E785 Hyperlipidemia, unspecified: Secondary | ICD-10-CM | POA: Diagnosis not present

## 2017-10-04 DIAGNOSIS — R7309 Other abnormal glucose: Secondary | ICD-10-CM

## 2017-10-04 LAB — CBC WITH DIFFERENTIAL/PLATELET
BASOS ABS: 0 10*3/uL (ref 0.0–0.1)
Basophils Relative: 0.4 % (ref 0.0–3.0)
EOS ABS: 0.1 10*3/uL (ref 0.0–0.7)
Eosinophils Relative: 1.5 % (ref 0.0–5.0)
HEMATOCRIT: 33 % — AB (ref 36.0–46.0)
Hemoglobin: 10.4 g/dL — ABNORMAL LOW (ref 12.0–15.0)
LYMPHS PCT: 21.2 % (ref 12.0–46.0)
Lymphs Abs: 1.9 10*3/uL (ref 0.7–4.0)
MCHC: 31.6 g/dL (ref 30.0–36.0)
MCV: 86.5 fl (ref 78.0–100.0)
MONOS PCT: 5 % (ref 3.0–12.0)
Monocytes Absolute: 0.4 10*3/uL (ref 0.1–1.0)
NEUTROS ABS: 6.4 10*3/uL (ref 1.4–7.7)
NEUTROS PCT: 71.9 % (ref 43.0–77.0)
PLATELETS: 232 10*3/uL (ref 150.0–400.0)
RBC: 3.81 Mil/uL — AB (ref 3.87–5.11)
RDW: 18.3 % — ABNORMAL HIGH (ref 11.5–15.5)
WBC: 8.9 10*3/uL (ref 4.0–10.5)

## 2017-10-04 LAB — COMPREHENSIVE METABOLIC PANEL
ALK PHOS: 119 U/L — AB (ref 39–117)
ALT: 17 U/L (ref 0–35)
AST: 16 U/L (ref 0–37)
Albumin: 3.5 g/dL (ref 3.5–5.2)
BUN: 10 mg/dL (ref 6–23)
CALCIUM: 8.8 mg/dL (ref 8.4–10.5)
CO2: 27 meq/L (ref 19–32)
CREATININE: 0.5 mg/dL (ref 0.40–1.20)
Chloride: 104 mEq/L (ref 96–112)
GFR: 130.18 mL/min (ref >60.00)
GLUCOSE: 113 mg/dL — AB (ref 70–99)
Potassium: 4.1 mEq/L (ref 3.5–5.1)
Sodium: 140 mEq/L (ref 135–145)
TOTAL PROTEIN: 6.6 g/dL (ref 6.0–8.3)
Total Bilirubin: 0.8 mg/dL (ref 0.2–1.2)

## 2017-10-04 LAB — LIPID PANEL
CHOL/HDL RATIO: 3
Cholesterol: 112 mg/dL (ref 0–200)
HDL: 33.1 mg/dL — ABNORMAL LOW (ref >39.00)
LDL Cholesterol: 56 mg/dL (ref 0–99)
NONHDL: 78.57
Triglycerides: 111 mg/dL (ref 0.0–149.0)
VLDL: 22.2 mg/dL (ref 0.0–40.0)

## 2017-10-05 LAB — PAIN MGMT, PROFILE 8 W/CONF, U
6 ACETYLMORPHINE: NEGATIVE ng/mL (ref <10)
ALCOHOL METABOLITES: NEGATIVE ng/mL (ref <500)
Amphetamines: NEGATIVE ng/mL (ref <500)
Benzodiazepines: NEGATIVE ng/mL (ref <100)
Buprenorphine, Urine: NEGATIVE ng/mL (ref <5)
Cocaine Metabolite: NEGATIVE ng/mL (ref <150)
Creatinine: 84.1 mg/dL
MARIJUANA METABOLITE: NEGATIVE ng/mL (ref <20)
MDMA: NEGATIVE ng/mL (ref <500)
OPIATES: NEGATIVE ng/mL (ref <100)
Oxidant: NEGATIVE ug/mL (ref <200)
Oxycodone: NEGATIVE ng/mL (ref <100)
pH: 5.83 (ref 4.5–9.0)

## 2017-10-25 DIAGNOSIS — H26492 Other secondary cataract, left eye: Secondary | ICD-10-CM | POA: Diagnosis not present

## 2017-10-25 DIAGNOSIS — H26493 Other secondary cataract, bilateral: Secondary | ICD-10-CM | POA: Diagnosis not present

## 2017-10-25 DIAGNOSIS — Z961 Presence of intraocular lens: Secondary | ICD-10-CM | POA: Diagnosis not present

## 2017-10-26 ENCOUNTER — Other Ambulatory Visit: Payer: Self-pay | Admitting: Family Medicine

## 2017-10-26 DIAGNOSIS — E039 Hypothyroidism, unspecified: Secondary | ICD-10-CM

## 2017-11-01 ENCOUNTER — Telehealth: Payer: Self-pay | Admitting: *Deleted

## 2017-11-01 NOTE — Telephone Encounter (Signed)
Copied from [place] [id number]. Topic: Quick Communication - Lab Results >> Nov 01, 2017  1:35 PM Hewitt Shorts wrote: Pt  is looking for lab results that were done on  10/04/17  Best number [phone]

## 2017-11-03 NOTE — Telephone Encounter (Signed)
Left detailed message on machine of results.

## 2017-11-05 ENCOUNTER — Other Ambulatory Visit: Payer: Self-pay | Admitting: Family Medicine

## 2017-11-05 DIAGNOSIS — Z1231 Encounter for screening mammogram for malignant neoplasm of breast: Secondary | ICD-10-CM

## 2017-11-22 ENCOUNTER — Ambulatory Visit (INDEPENDENT_AMBULATORY_CARE_PROVIDER_SITE_OTHER): Payer: Medicare Other | Admitting: Podiatry

## 2017-11-22 DIAGNOSIS — L989 Disorder of the skin and subcutaneous tissue, unspecified: Secondary | ICD-10-CM

## 2017-11-22 DIAGNOSIS — M65172 Other infective (teno)synovitis, left ankle and foot: Secondary | ICD-10-CM

## 2017-11-22 DIAGNOSIS — B351 Tinea unguium: Secondary | ICD-10-CM

## 2017-11-22 DIAGNOSIS — M79676 Pain in unspecified toe(s): Secondary | ICD-10-CM | POA: Diagnosis not present

## 2017-11-22 DIAGNOSIS — M659 Synovitis and tenosynovitis, unspecified: Secondary | ICD-10-CM

## 2017-11-22 DIAGNOSIS — M21372 Foot drop, left foot: Secondary | ICD-10-CM
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
## 2017-12-09 ENCOUNTER — Encounter: Payer: Self-pay | Admitting: Family Medicine

## 2017-12-09 ENCOUNTER — Ambulatory Visit (INDEPENDENT_AMBULATORY_CARE_PROVIDER_SITE_OTHER): Payer: Medicare Other | Admitting: Family Medicine

## 2017-12-09 VITALS — BP 126/78 | HR 72 | Temp 97.9°F | Resp 16 | Ht 60.0 in | Wt 274.2 lb

## 2017-12-09 DIAGNOSIS — R21 Rash and other nonspecific skin eruption: Secondary | ICD-10-CM | POA: Diagnosis not present

## 2017-12-09 DIAGNOSIS — E785 Hyperlipidemia, unspecified: Secondary | ICD-10-CM | POA: Diagnosis not present

## 2017-12-09 DIAGNOSIS — I10 Essential (primary) hypertension: Secondary | ICD-10-CM | POA: Diagnosis not present

## 2017-12-09 DIAGNOSIS — Z0000 Encounter for general adult medical examination without abnormal findings: Secondary | ICD-10-CM | POA: Diagnosis not present

## 2017-12-09 DIAGNOSIS — D500 Iron deficiency anemia secondary to blood loss (chronic): Secondary | ICD-10-CM

## 2017-12-09 DIAGNOSIS — G839 Paralytic syndrome, unspecified: Secondary | ICD-10-CM | POA: Diagnosis not present

## 2017-12-09 DIAGNOSIS — E039 Hypothyroidism, unspecified: Secondary | ICD-10-CM | POA: Diagnosis not present

## 2017-12-09 LAB — CBC WITH DIFFERENTIAL/PLATELET
BASOS ABS: 0 10*3/uL (ref 0.0–0.1)
BASOS PCT: 0.4 % (ref 0.0–3.0)
Eosinophils Absolute: 0.1 10*3/uL (ref 0.0–0.7)
Eosinophils Relative: 1.2 % (ref 0.0–5.0)
HCT: 35.3 % — ABNORMAL LOW (ref 36.0–46.0)
HEMOGLOBIN: 11.4 g/dL — AB (ref 12.0–15.0)
LYMPHS PCT: 20.5 % (ref 12.0–46.0)
Lymphs Abs: 1.9 10*3/uL (ref 0.7–4.0)
MCHC: 32.3 g/dL (ref 30.0–36.0)
MCV: 84.1 fl (ref 78.0–100.0)
MONOS PCT: 4.9 % (ref 3.0–12.0)
Monocytes Absolute: 0.4 10*3/uL (ref 0.1–1.0)
Neutro Abs: 6.7 10*3/uL (ref 1.4–7.7)
Neutrophils Relative %: 73 % (ref 43.0–77.0)
Platelets: 229 10*3/uL (ref 150.0–400.0)
RBC: 4.2 Mil/uL (ref 3.87–5.11)
RDW: 17.8 % — ABNORMAL HIGH (ref 11.5–15.5)
WBC: 9.1 10*3/uL (ref 4.0–10.5)

## 2017-12-09 LAB — COMPREHENSIVE METABOLIC PANEL
ALBUMIN: 4 g/dL (ref 3.5–5.2)
ALK PHOS: 156 U/L — AB (ref 39–117)
ALT: 19 U/L (ref 0–35)
AST: 17 U/L (ref 0–37)
BILIRUBIN TOTAL: 0.9 mg/dL (ref 0.2–1.2)
BUN: 8 mg/dL (ref 6–23)
CALCIUM: 9.3 mg/dL (ref 8.4–10.5)
CO2: 33 mEq/L — ABNORMAL HIGH (ref 19–32)
CREATININE: 0.52 mg/dL (ref 0.40–1.20)
Chloride: 105 mEq/L (ref 96–112)
GFR: 124.36 mL/min (ref >60.00)
Glucose, Bld: 117 mg/dL — ABNORMAL HIGH (ref 70–99)
Potassium: 5 mEq/L (ref 3.5–5.1)
Sodium: 146 mEq/L — ABNORMAL HIGH (ref 135–145)
Total Protein: 6.7 g/dL (ref 6.0–8.3)

## 2017-12-09 LAB — LIPID PANEL
CHOL/HDL RATIO: 4
CHOLESTEROL: 141 mg/dL (ref 0–200)
HDL: 38.4 mg/dL — ABNORMAL LOW (ref >39.00)
LDL CALC: 79 mg/dL (ref 0–99)
NonHDL: 102.91
Triglycerides: 121 mg/dL (ref 0.0–149.0)
VLDL: 24.2 mg/dL (ref 0.0–40.0)

## 2017-12-09 LAB — TSH: TSH: 3.97 u[IU]/mL (ref 0.35–4.50)

## 2017-12-09 MED ORDER — CLOTRIMAZOLE-BETAMETHASONE 1-0.05 % EX CREA: 1.0000 "application " | TOPICAL_CREAM | Freq: Two times a day (BID) | CUTANEOUS | 0 refills | Status: DC

## 2017-12-09 NOTE — Assessment & Plan Note (Signed)
Check labs con't meds 
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
## 2017-12-09 NOTE — Patient Instructions (Signed)
Preventive Care 69 Years and Older, Female Preventive care refers to lifestyle choices and visits with your health care provider that can promote health and wellness. What does preventive care include?  A yearly physical exam. This is also called an annual well check.  Dental exams once or twice a year.  Routine eye exams. Ask your health care provider how often you should have your eyes checked.  Personal lifestyle choices, including: ? Daily care of your teeth and gums. ? Regular physical activity. ? Eating a healthy diet. ? Avoiding tobacco and drug use. ? Limiting alcohol use. ? Practicing safe sex. ? Taking low-dose aspirin every day. ? Taking vitamin and mineral supplements as recommended by your health care provider. What happens during an annual well check? The services and screenings done by your health care provider during your annual well check will depend on your age, overall health, lifestyle risk factors, and family history of disease. Counseling Your health care provider may ask you questions about your:  Alcohol use.  Tobacco use.  Drug use.  Emotional well-being.  Home and relationship well-being.  Sexual activity.  Eating habits.  History of falls.  Memory and ability to understand (cognition).  Work and work environment.  Reproductive health.  Screening You may have the following tests or measurements:  Height, weight, and BMI.  Blood pressure.  Lipid and cholesterol levels. These may be checked every 5 years, or more frequently if you are over 50 years old.  Skin check.  Lung cancer screening. You may have this screening every year starting at age 69 if you have a 30-pack-year history of smoking and currently smoke or have quit within the past 15 years.  Fecal occult blood test (FOBT) of the stool. You may have this test every year starting at age 69.  Flexible sigmoidoscopy or colonoscopy. You may have a sigmoidoscopy every 5 years or  a colonoscopy every 10 years starting at age 69.  Hepatitis C blood test.  Hepatitis B blood test.  Sexually transmitted disease (STD) testing.  Diabetes screening. This is done by checking your blood sugar (glucose) after you have not eaten for a while (fasting). You may have this done every 1-3 years.  Bone density scan. This is done to screen for osteoporosis. You may have this done starting at age 69.  Mammogram. This may be done every 1-2 years. Talk to your health care provider about how often you should have regular mammograms.  Talk with your health care provider about your test results, treatment options, and if necessary, the need for more tests. Vaccines Your health care provider may recommend certain vaccines, such as:  Influenza vaccine. This is recommended every year.  Tetanus, diphtheria, and acellular pertussis (Tdap, Td) vaccine. You may need a Td booster every 10 years.  Varicella vaccine. You may need this if you have not been vaccinated.  Zoster vaccine. You may need this after age 69.  Measles, mumps, and rubella (MMR) vaccine. You may need at least one dose of MMR if you were born in 1957 or later. You may also need a second dose.  Pneumococcal 13-valent conjugate (PCV13) vaccine. One dose is recommended after age 69.  Pneumococcal polysaccharide (PPSV23) vaccine. One dose is recommended after age 69.  Meningococcal vaccine. You may need this if you have certain conditions.  Hepatitis A vaccine. You may need this if you have certain conditions or if you travel or work in places where you may be exposed to hepatitis   A.  Hepatitis B vaccine. You may need this if you have certain conditions or if you travel or work in places where you may be exposed to hepatitis B.  Haemophilus influenzae type b (Hib) vaccine. You may need this if you have certain conditions.  Talk to your health care provider about which screenings and vaccines you need and how often you  need them. This information is not intended to replace advice given to you by your health care provider. Make sure you discuss any questions you have with your health care provider. Document Released: 06/28/2015 Document Revised: 02/19/2016 Document Reviewed: 04/02/2015 Elsevier Interactive Patient Education  2018 Elsevier Inc.  
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
## 2017-12-09 NOTE — Assessment & Plan Note (Signed)
Well controlled, no changes to meds. Encouraged heart healthy diet such as the DASH diet and exercise as tolerated.  °

## 2017-12-09 NOTE — Assessment & Plan Note (Signed)
Tolerating statin, encouraged heart healthy diet, avoid trans fats, minimize simple carbs and saturated fats. Increase exercise as tolerated 

## 2017-12-13 ENCOUNTER — Encounter: Payer: Self-pay | Admitting: *Deleted

## 2017-12-17 ENCOUNTER — Ambulatory Visit
Admission: RE | Admit: 2017-12-17 | Discharge: 2017-12-17 | Disposition: A | Discharge status: Home / Self Care | Payer: Medicare Other | Source: Ambulatory Visit | Attending: Family Medicine | Admitting: Family Medicine

## 2017-12-17 ENCOUNTER — Encounter: Payer: Self-pay | Admitting: *Deleted

## 2017-12-17 DIAGNOSIS — Z1231 Encounter for screening mammogram for malignant neoplasm of breast: Secondary | ICD-10-CM

## 2017-12-17 HISTORY — DX: Personal history of irradiation: Z92.3

## 2017-12-20 ENCOUNTER — Telehealth: Payer: Self-pay | Admitting: *Deleted

## 2017-12-20 ENCOUNTER — Other Ambulatory Visit: Payer: Self-pay | Admitting: Family Medicine

## 2017-12-20 DIAGNOSIS — G839 Paralytic syndrome, unspecified: Secondary | ICD-10-CM

## 2017-12-20 DIAGNOSIS — M159 Polyosteoarthritis, unspecified: Secondary | ICD-10-CM

## 2017-12-20 DIAGNOSIS — R2681 Unsteadiness on feet: Secondary | ICD-10-CM

## 2017-12-20 DIAGNOSIS — M150 Primary generalized (osteo)arthritis: Secondary | ICD-10-CM

## 2017-12-20 NOTE — Telephone Encounter (Signed)
Received Physician Orders/DME from [place];, forwarded to provider/SLS 07/08

## 2017-12-21 ENCOUNTER — Telehealth: Payer: Self-pay | Admitting: *Deleted

## 2017-12-21 NOTE — Telephone Encounter (Signed)
Spoke with pt. She states she has an appointment on Monday for PT and she didn't understand why she needed this. Advised pt that PT referral is for mobility evaluation for power chair and she voices understanding. Pt states PT told her they would send their findings to [place]. Pt requests that we can her when all paperwork has been submitted for power chair approval.

## 2017-12-21 NOTE — Telephone Encounter (Signed)
Copied from [place] [id number]. Topic: Inquiry >> Dec 20, 2017  3:58 PM Conception Chancy, NT wrote: Reason for CRM: patient is requesting a call back from Dr. Etter Sjogren. She states it is in regards to getting a phone call from a physical therapist.

## 2017-12-22 ENCOUNTER — Ambulatory Visit: Payer: Medicare Other | Admitting: Orthotics

## 2017-12-22 DIAGNOSIS — M21372 Foot drop, left foot: Secondary | ICD-10-CM

## 2017-12-22 DIAGNOSIS — M79661 Pain in right lower leg: Secondary | ICD-10-CM

## 2017-12-22 DIAGNOSIS — B351 Tinea unguium: Secondary | ICD-10-CM

## 2017-12-22 DIAGNOSIS — G8194 Hemiplegia, unspecified affecting left nondominant side: Secondary | ICD-10-CM

## 2017-12-22 DIAGNOSIS — M79676 Pain in unspecified toe(s): Principal | ICD-10-CM

## 2017-12-22 DIAGNOSIS — L989 Disorder of the skin and subcutaneous tissue, unspecified: Secondary | ICD-10-CM

## 2017-12-22 DIAGNOSIS — M24572 Contracture, left ankle: Secondary | ICD-10-CM
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
## 2017-12-24 ENCOUNTER — Telehealth: Payer: Self-pay | Admitting: Family Medicine

## 2017-12-24 NOTE — Telephone Encounter (Signed)
Yes---- she just needs evaluation to get the chair--- we do the order

## 2017-12-24 NOTE — Telephone Encounter (Unsigned)
Copied from [place] [id number]. Topic: Quick Communication - See Telephone Encounter >> Dec 24, 2017 10:10 AM Percell Belt A wrote: CRM for notification. See Telephone encounter for: 12/24/17.  [facility] called and wanted to make sure everyone is on the page.  She stated they can not order or pick out the wheelchair.  They just wanted to be clear that they were just accessing her in order to get the wheelchair   [place] [phone]

## 2017-12-27 DIAGNOSIS — R2689 Other abnormalities of gait and mobility: Secondary | ICD-10-CM | POA: Diagnosis not present

## 2017-12-27 DIAGNOSIS — R262 Difficulty in walking, not elsewhere classified: Secondary | ICD-10-CM | POA: Diagnosis not present

## 2017-12-27 DIAGNOSIS — M6281 Muscle weakness (generalized): Secondary | ICD-10-CM | POA: Diagnosis not present

## 2017-12-27 DIAGNOSIS — R2681 Unsteadiness on feet: Secondary | ICD-10-CM | POA: Diagnosis not present

## 2018-01-12 DIAGNOSIS — H26491 Other secondary cataract, right eye: Secondary | ICD-10-CM | POA: Diagnosis not present

## 2018-01-17 ENCOUNTER — Other Ambulatory Visit: Payer: Self-pay | Admitting: Family Medicine

## 2018-01-17 DIAGNOSIS — I10 Essential (primary) hypertension: Secondary | ICD-10-CM

## 2018-01-19 ENCOUNTER — Other Ambulatory Visit: Payer: Self-pay | Admitting: Family Medicine

## 2018-01-19 DIAGNOSIS — I10 Essential (primary) hypertension: Secondary | ICD-10-CM

## 2018-01-20 ENCOUNTER — Other Ambulatory Visit: Payer: Medicare Other | Admitting: Orthotics

## 2018-01-26 ENCOUNTER — Other Ambulatory Visit: Payer: Self-pay | Admitting: Family Medicine

## 2018-01-26 DIAGNOSIS — I10 Essential (primary) hypertension: Secondary | ICD-10-CM
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
## 2018-02-04 ENCOUNTER — Ambulatory Visit (INDEPENDENT_AMBULATORY_CARE_PROVIDER_SITE_OTHER): Payer: Medicare Other | Admitting: *Deleted

## 2018-02-04 ENCOUNTER — Encounter: Payer: Self-pay | Admitting: *Deleted

## 2018-02-04 VITALS — BP 118/67 | HR 89 | Ht 60.0 in | Wt 274.0 lb

## 2018-02-04 DIAGNOSIS — Z0000 Encounter for general adult medical examination without abnormal findings: Secondary | ICD-10-CM

## 2018-02-04 NOTE — Patient Instructions (Signed)
Please schedule your next medicare wellness visit with me in 1 yr.  Continue to eat heart healthy diet (full of fruits, vegetables, whole grains, lean protein, water--limit salt, fat, and sugar intake) and increase physical activity as tolerated.  Continue doing brain stimulating activities (puzzles, reading, adult coloring books, staying active) to keep memory sharp.   Bring a copy of your living will and/or healthcare power of attorney to your next office visit.   Kelly [name] , Thank you for taking time to come for your Medicare Wellness Visit. I appreciate your ongoing commitment to your health goals. Please review the following plan we discussed and let me know if I can assist you in the future.   These are the goals we discussed: Goals    . Lose 15lbs in 1 year.   (pt-stated)       This is a list of the screening recommended for you and due dates:  Health Maintenance  Topic Date Due  . Flu Shot  03/05/2018*  . Mammogram  12/18/2019  . Colon Cancer Screening  06/27/2020  . Tetanus Vaccine  10/30/2023  . DEXA scan (bone density measurement)  Completed  .  Hepatitis C: One time screening is recommended by Center for Disease Control  (CDC) for  adults born from [birth year] through 1967.   Completed  . Pneumonia vaccines  Completed  *Topic was postponed. The date shown is not the original due date.    Health Maintenance for Postmenopausal Women Menopause is a normal process in which your reproductive ability comes to an end. This process happens gradually over a span of months to years, usually between the ages of 15 and 58. Menopause is complete when you have missed 12 consecutive menstrual periods. It is important to talk with your health care provider about some of the most common conditions that affect postmenopausal women, such as heart disease, cancer, and bone loss (osteoporosis). Adopting a healthy lifestyle and getting preventive care can help to promote your health and wellness.  Those actions can also lower your chances of developing some of these common conditions. What should I know about menopause? During menopause, you may experience a number of symptoms, such as:  Moderate-to-severe hot flashes.  Night sweats.  Decrease in sex drive.  Mood swings.  Headaches.  Tiredness.  Irritability.  Memory problems.  Insomnia.  Choosing to treat or not to treat menopausal changes is an individual decision that you make with your health care provider. What should I know about hormone replacement therapy and supplements? Hormone therapy products are effective for treating symptoms that are associated with menopause, such as hot flashes and night sweats. Hormone replacement carries certain risks, especially as you become older. If you are thinking about using estrogen or estrogen with progestin treatments, discuss the benefits and risks with your health care provider. What should I know about heart disease and stroke? Heart disease, heart attack, and stroke become more likely as you age. This may be due, in part, to the hormonal changes that your body experiences during menopause. These can affect how your body processes dietary fats, triglycerides, and cholesterol. Heart attack and stroke are both medical emergencies. There are many things that you can do to help prevent heart disease and stroke:  Have your blood pressure checked at least every 1-2 years. High blood pressure causes heart disease and increases the risk of stroke.  If you are 70-55 years old, ask your health care provider if you should take aspirin to  prevent a heart attack or a stroke.  Do not use any tobacco products, including cigarettes, chewing tobacco, or electronic cigarettes. If you need help quitting, ask your health care provider.  It is important to eat a healthy diet and maintain a healthy weight. ? Be sure to include plenty of vegetables, fruits, low-fat dairy products, and lean  protein. ? Avoid eating foods that are high in solid fats, added sugars, or salt (sodium).  Get regular exercise. This is one of the most important things that you can do for your health. ? Try to exercise for at least 150 minutes each week. The type of exercise that you do should increase your heart rate and make you sweat. This is known as moderate-intensity exercise. ? Try to do strengthening exercises at least twice each week. Do these in addition to the moderate-intensity exercise.  Know your numbers.Ask your health care provider to check your cholesterol and your blood glucose. Continue to have your blood tested as directed by your health care provider.  What should I know about cancer screening? There are several types of cancer. Take the following steps to reduce your risk and to catch any cancer development as early as possible. Breast Cancer  Practice breast self-awareness. ? This means understanding how your breasts normally appear and feel. ? It also means doing regular breast self-exams. Let your health care provider know about any changes, no matter how small.  If you are 46 or older, have a clinician do a breast exam (clinical breast exam or CBE) every year. Depending on your age, family history, and medical history, it may be recommended that you also have a yearly breast X-ray (mammogram).  If you have a family history of breast cancer, talk with your health care provider about genetic screening.  If you are at high risk for breast cancer, talk with your health care provider about having an MRI and a mammogram every year.  Breast cancer (BRCA) gene test is recommended for women who have family members with BRCA-related cancers. Results of the assessment will determine the need for genetic counseling and BRCA1 and for BRCA2 testing. BRCA-related cancers include these types: ? Breast. This occurs in males or females. ? Ovarian. ? Tubal. This may also be called fallopian tube  cancer. ? Cancer of the abdominal or pelvic lining (peritoneal cancer). ? Prostate. ? Pancreatic.  Cervical, Uterine, and Ovarian Cancer Your health care provider may recommend that you be screened regularly for cancer of the pelvic organs. These include your ovaries, uterus, and vagina. This screening involves a pelvic exam, which includes checking for microscopic changes to the surface of your cervix (Pap test).  For women ages 21-65, health care providers may recommend a pelvic exam and a Pap test every three years. For women ages 52-65, they may recommend the Pap test and pelvic exam, combined with testing for human papilloma virus (HPV), every five years. Some types of HPV increase your risk of cervical cancer. Testing for HPV may also be done on women of any age who have unclear Pap test results.  Other health care providers may not recommend any screening for nonpregnant women who are considered low risk for pelvic cancer and have no symptoms. Ask your health care provider if a screening pelvic exam is right for you.  If you have had past treatment for cervical cancer or a condition that could lead to cancer, you need Pap tests and screening for cancer for at least 20 years after  your treatment. If Pap tests have been discontinued for you, your risk factors (such as having a new sexual partner) need to be reassessed to determine if you should start having screenings again. Some women have medical problems that increase the chance of getting cervical cancer. In these cases, your health care provider may recommend that you have screening and Pap tests more often.  If you have a family history of uterine cancer or ovarian cancer, talk with your health care provider about genetic screening.  If you have vaginal bleeding after reaching menopause, tell your health care provider.  There are currently no reliable tests available to screen for ovarian cancer.  Lung Cancer Lung cancer screening is  recommended for adults 58-74 years old who are at high risk for lung cancer because of a history of smoking. A yearly low-dose CT scan of the lungs is recommended if you:  Currently smoke.  Have a history of at least 30 pack-years of smoking and you currently smoke or have quit within the past 15 years. A pack-year is smoking an average of one pack of cigarettes per day for one year.  Yearly screening should:  Continue until it has been 15 years since you quit.  Stop if you develop a health problem that would prevent you from having lung cancer treatment.  Colorectal Cancer  This type of cancer can be detected and can often be prevented.  Routine colorectal cancer screening usually begins at age 34 and continues through age 55.  If you have risk factors for colon cancer, your health care provider may recommend that you be screened at an earlier age.  If you have a family history of colorectal cancer, talk with your health care provider about genetic screening.  Your health care provider may also recommend using home test kits to check for hidden blood in your stool.  A small camera at the end of a tube can be used to examine your colon directly (sigmoidoscopy or colonoscopy). This is done to check for the earliest forms of colorectal cancer.  Direct examination of the colon should be repeated every 5-10 years until age 43. However, if early forms of precancerous polyps or small growths are found or if you have a family history or genetic risk for colorectal cancer, you may need to be screened more often.  Skin Cancer  Check your skin from head to toe regularly.  Monitor any moles. Be sure to tell your health care provider: ? About any new moles or changes in moles, especially if there is a change in a mole's shape or color. ? If you have a mole that is larger than the size of a pencil eraser.  If any of your family members has a history of skin cancer, especially at a young age,  talk with your health care provider about genetic screening.  Always use sunscreen. Apply sunscreen liberally and repeatedly throughout the day.  Whenever you are outside, protect yourself by wearing long sleeves, pants, a wide-brimmed hat, and sunglasses.  What should I know about osteoporosis? Osteoporosis is a condition in which bone destruction happens more quickly than new bone creation. After menopause, you may be at an increased risk for osteoporosis. To help prevent osteoporosis or the bone fractures that can happen because of osteoporosis, the following is recommended:  If you are 36-71 years old, get at least 1,000 mg of calcium and at least 600 mg of vitamin D per day.  If you are older than  age 36 but younger than age 68, get at least 1,200 mg of calcium and at least 600 mg of vitamin D per day.  If you are older than age 83, get at least 1,200 mg of calcium and at least 800 mg of vitamin D per day.  Smoking and excessive alcohol intake increase the risk of osteoporosis. Eat foods that are rich in calcium and vitamin D, and do weight-bearing exercises several times each week as directed by your health care provider. What should I know about how menopause affects my mental health? Depression may occur at any age, but it is more common as you become older. Common symptoms of depression include:  Low or sad mood.  Changes in sleep patterns.  Changes in appetite or eating patterns.  Feeling an overall lack of motivation or enjoyment of activities that you previously enjoyed.  Frequent crying spells.  Talk with your health care provider if you think that you are experiencing depression. What should I know about immunizations? It is important that you get and maintain your immunizations. These include:  Tetanus, diphtheria, and pertussis (Tdap) booster vaccine.  Influenza every year before the flu season begins.  Pneumonia vaccine.  Shingles vaccine.  Your health care  provider may also recommend other immunizations. This information is not intended to replace advice given to you by your health care provider. Make sure you discuss any questions you have with your health care provider. Document Released: 07/24/2005 Document Revised: 12/20/2015 Document Reviewed: 03/05/2015 Elsevier Interactive Patient Education  2018 [employer].

## 2018-02-04 NOTE — Progress Notes (Signed)
Reviewed  Yvonne R Lowne Chase, DO  

## 2018-02-15 ENCOUNTER — Ambulatory Visit (INDEPENDENT_AMBULATORY_CARE_PROVIDER_SITE_OTHER): Payer: Medicare Other | Admitting: Orthotics

## 2018-02-15 DIAGNOSIS — M21372 Foot drop, left foot: Secondary | ICD-10-CM

## 2018-02-15 DIAGNOSIS — M659 Synovitis and tenosynovitis, unspecified: Secondary | ICD-10-CM

## 2018-02-15 DIAGNOSIS — M65972 Unspecified synovitis and tenosynovitis, left ankle and foot: Secondary | ICD-10-CM

## 2018-02-15 DIAGNOSIS — G8194 Hemiplegia, unspecified affecting left nondominant side: Secondary | ICD-10-CM

## 2018-02-15 DIAGNOSIS — Z9181 History of falling: Secondary | ICD-10-CM

## 2018-02-15 DIAGNOSIS — M24572 Contracture, left ankle: Secondary | ICD-10-CM

## 2018-02-15 NOTE — Progress Notes (Signed)
Patient came in today to pick up [place] (Bilateral).   Patient was able to don brace independently and brace was check for fit to custom.   Left Brace also had dorsi assist hinges to help with foot drop, secondary to childhood trauma.  Patient was observed walking with brace and gait was improved as well as stability.   Patient was advised of care and wearing instructions.  Advised to notify practice if there were any issues; especially skin irritation.

## 2018-02-17 ENCOUNTER — Ambulatory Visit: Payer: Medicare Other | Attending: Family Medicine | Admitting: Physical Therapy

## 2018-02-17 DIAGNOSIS — G8114 Spastic hemiplegia affecting left nondominant side: Secondary | ICD-10-CM | POA: Diagnosis not present

## 2018-02-17 DIAGNOSIS — R2689 Other abnormalities of gait and mobility: Secondary | ICD-10-CM

## 2018-02-18 ENCOUNTER — Encounter: Payer: Self-pay | Admitting: Physical Therapy

## 2018-02-18 ENCOUNTER — Other Ambulatory Visit: Payer: Self-pay
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
## 2018-02-21 ENCOUNTER — Telehealth: Payer: Self-pay | Admitting: Family Medicine

## 2018-02-21 NOTE — Telephone Encounter (Signed)
Copied from [place] [id number]. Topic: Quick Communication - See Telephone Encounter >> Feb 21, 2018  4:47 PM Rosalin Hawking wrote: CRM for notification. See Telephone encounter for: 02/21/18.   Pt's family dropped off document to be filled out by provider ( SCAT Application 8 pages with front and back on it) Pt ok with document being faxed to [phone]. Document put at front office tray under providers name.

## 2018-02-22 NOTE — Telephone Encounter (Signed)
Received SCAT Application, pt needs to sign omn PArt B"Professional Verification Form" for Medical Release; forwarded to provider/SLS 09/10

## 2018-03-07 ENCOUNTER — Telehealth: Payer: Self-pay | Admitting: Family Medicine

## 2018-03-07 NOTE — Telephone Encounter (Signed)
Copied from [place] [id number]. Topic: Quick Communication - See Telephone Encounter >> Mar 07, 2018  2:22 PM Rosalin Hawking wrote: CRM for notification. See Telephone encounter for: 03/07/18.   Debbie from [facility] dropped off document to be filled out by provider. Jackelyn Poling indicated that form from 2nd page needs to have Date of face to face examination date on the same day that the form is filled out date. Document has yellow notes indicating instruction to be filled out.

## 2018-03-08 NOTE — Telephone Encounter (Signed)
Completed as much as possible; forwarded to provider/SLS 09/24

## 2018-03-12 ENCOUNTER — Emergency Department (HOSPITAL_COMMUNITY): Payer: Medicare Other

## 2018-03-12 ENCOUNTER — Encounter (HOSPITAL_COMMUNITY): Payer: Self-pay

## 2018-03-12 ENCOUNTER — Emergency Department (HOSPITAL_COMMUNITY)
Admission: EM | Admit: 2018-03-12 | Discharge: 2018-03-12 | Disposition: A | Discharge status: Home / Self Care | Payer: Medicare Other | Attending: Emergency Medicine | Admitting: Emergency Medicine

## 2018-03-12 DIAGNOSIS — R531 Weakness: Secondary | ICD-10-CM | POA: Diagnosis not present

## 2018-03-12 DIAGNOSIS — Z853 Personal history of malignant neoplasm of breast: Secondary | ICD-10-CM | POA: Insufficient documentation

## 2018-03-12 DIAGNOSIS — S0003XA Contusion of scalp, initial encounter: Secondary | ICD-10-CM | POA: Diagnosis not present

## 2018-03-12 DIAGNOSIS — Z79899 Other long term (current) drug therapy: Secondary | ICD-10-CM | POA: Insufficient documentation

## 2018-03-12 DIAGNOSIS — S0011XA Contusion of right eyelid and periocular area, initial encounter: Secondary | ICD-10-CM | POA: Insufficient documentation

## 2018-03-12 DIAGNOSIS — S0993XA Unspecified injury of face, initial encounter: Secondary | ICD-10-CM | POA: Diagnosis not present

## 2018-03-12 DIAGNOSIS — Y929 Unspecified place or not applicable: Secondary | ICD-10-CM | POA: Insufficient documentation

## 2018-03-12 DIAGNOSIS — Y999 Unspecified external cause status: Secondary | ICD-10-CM | POA: Diagnosis not present

## 2018-03-12 DIAGNOSIS — Z86711 Personal history of pulmonary embolism: Secondary | ICD-10-CM | POA: Diagnosis not present

## 2018-03-12 DIAGNOSIS — W1830XA Fall on same level, unspecified, initial encounter: Secondary | ICD-10-CM | POA: Diagnosis not present

## 2018-03-12 DIAGNOSIS — Z87891 Personal history of nicotine dependence: Secondary | ICD-10-CM | POA: Diagnosis not present

## 2018-03-12 DIAGNOSIS — R200 Anesthesia of skin: Secondary | ICD-10-CM | POA: Diagnosis not present

## 2018-03-12 DIAGNOSIS — S0990XA Unspecified injury of head, initial encounter: Secondary | ICD-10-CM | POA: Diagnosis not present

## 2018-03-12 DIAGNOSIS — Y9301 Activity, walking, marching and hiking: Secondary | ICD-10-CM | POA: Insufficient documentation

## 2018-03-12 DIAGNOSIS — T148XXA Other injury of unspecified body region, initial encounter: Secondary | ICD-10-CM

## 2018-03-12 DIAGNOSIS — I959 Hypotension, unspecified: Secondary | ICD-10-CM | POA: Diagnosis not present

## 2018-03-12 DIAGNOSIS — S63502A Unspecified sprain of left wrist, initial encounter: Secondary | ICD-10-CM | POA: Insufficient documentation

## 2018-03-12 DIAGNOSIS — S0081XA Abrasion of other part of head, initial encounter: Secondary | ICD-10-CM | POA: Diagnosis not present

## 2018-03-12 DIAGNOSIS — W19XXXA Unspecified fall, initial encounter: Secondary | ICD-10-CM | POA: Diagnosis not present

## 2018-03-12 DIAGNOSIS — R0902 Hypoxemia: Secondary | ICD-10-CM | POA: Diagnosis not present

## 2018-03-12 DIAGNOSIS — E039 Hypothyroidism, unspecified: Secondary | ICD-10-CM | POA: Insufficient documentation

## 2018-03-12 DIAGNOSIS — S6992XA Unspecified injury of left wrist, hand and finger(s), initial encounter: Secondary | ICD-10-CM | POA: Diagnosis not present

## 2018-03-12 DIAGNOSIS — I10 Essential (primary) hypertension: Secondary | ICD-10-CM | POA: Insufficient documentation

## 2018-03-12 DIAGNOSIS — G8194 Hemiplegia, unspecified affecting left nondominant side: Secondary | ICD-10-CM | POA: Diagnosis not present

## 2018-03-12 DIAGNOSIS — R51 Headache: Secondary | ICD-10-CM | POA: Diagnosis present

## 2018-03-12 DIAGNOSIS — M79642 Pain in left hand: Secondary | ICD-10-CM | POA: Diagnosis not present

## 2018-03-12 DIAGNOSIS — M542 Cervicalgia: Secondary | ICD-10-CM | POA: Insufficient documentation

## 2018-03-12 DIAGNOSIS — M25532 Pain in left wrist: Secondary | ICD-10-CM | POA: Diagnosis not present

## 2018-03-12 DIAGNOSIS — S199XXA Unspecified injury of neck, initial encounter: Secondary | ICD-10-CM | POA: Diagnosis not present

## 2018-03-12 MED ORDER — FENTANYL CITRATE (PF) 100 MCG/2ML IJ SOLN
100.0000 ug | Freq: Once | INTRAMUSCULAR | Status: AC
  Administered 2018-03-12: 100 ug via INTRAMUSCULAR
  Filled 2018-03-12: qty 2

## 2018-03-12 NOTE — ED Triage Notes (Signed)
Patient arrived via [facility] from home. Patient fell walking through a doorway. Fell and hit right eyebrow area (hemtoma). No LOC. Denies vision changes. Denies blood thinners. Paralyzed on the left side. Can bare some weight but not ambulatory.  BP-130/80 HR-100

## 2018-03-12 NOTE — ED Notes (Signed)
Bed: WA20 Expected date:  Expected time:  Means of arrival:  Comments: 67 yo fall, head injury

## 2018-03-12 NOTE — ED Notes (Signed)
Splint would not go on patient contracted left hand. Ace wrap put on. MD made aware.
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
## 2018-03-12 NOTE — Discharge Instructions (Signed)
You can take Tylenol or Ibuprofen as directed for pain. You can alternate Tylenol and Ibuprofen every 4 hours. If you take Tylenol at 1pm, then you can take Ibuprofen at 5pm. Then you can take Tylenol again at 9pm.   Follow the RICE (Rest, Ice, Compression, Elevation) protocol as directed.  As we discussed, the bruising of your face might begin to spread down because of gravity.  You can apply ice to help with swelling.  Wear the splint for support and stabilization.   Follow-up with your primary care doctor next to 3 days for further evaluation.  Return to emergency department for any worsening pain, difficulty walking, numbness/weakness of your arms or legs, vomiting or any other worsening or concerning symptoms.

## 2018-03-15 ENCOUNTER — Ambulatory Visit (INDEPENDENT_AMBULATORY_CARE_PROVIDER_SITE_OTHER): Payer: Medicare Other | Admitting: Family Medicine

## 2018-03-15 ENCOUNTER — Encounter: Payer: Self-pay | Admitting: Family Medicine

## 2018-03-15 ENCOUNTER — Telehealth: Payer: Self-pay

## 2018-03-15 VITALS — BP 126/80 | HR 83 | Temp 98.1°F | Resp 16

## 2018-03-15 DIAGNOSIS — R296 Repeated falls: Secondary | ICD-10-CM

## 2018-03-15 DIAGNOSIS — Z23 Encounter for immunization: Secondary | ICD-10-CM

## 2018-03-15 DIAGNOSIS — T148XXA Other injury of unspecified body region, initial encounter: Secondary | ICD-10-CM | POA: Insufficient documentation

## 2018-03-15 MED ORDER — IBUPROFEN 600 MG PO TABS: 600.0000 mg | ORAL_TABLET | Freq: Four times a day (QID) | ORAL | 1 refills | Status: DC | PRN

## 2018-03-15 NOTE — Addendum Note (Signed)
Addended by: Kem Boroughs D on: 03/15/2018 04:19 PM   Modules accepted: Orders

## 2018-03-15 NOTE — Assessment & Plan Note (Signed)
R side face and L hand--- warm compresses Tylenol  Pt has ibuprofen from another provider--- use rarely

## 2018-03-15 NOTE — Patient Instructions (Signed)

## 2018-03-15 NOTE — Assessment & Plan Note (Signed)
Pt tripped over threshold over neighbors door
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
## 2018-03-16 ENCOUNTER — Telehealth: Payer: Self-pay | Admitting: *Deleted

## 2018-03-16 NOTE — Telephone Encounter (Signed)
Received Physician Orders from [place]; forwarded to provider/SLS 10/02

## 2018-03-23 ENCOUNTER — Telehealth: Payer: Self-pay | Admitting: *Deleted

## 2018-03-23 NOTE — Telephone Encounter (Signed)
Copied from [place] [phone]. Topic: General - Other >> Mar 22, 2018 12:34 PM Yvette Rack wrote: Reason for CRM: Juanda Crumble with [employer] states they have not received a response to the fax that was sent on 03/16/18. Charles requests response to be faxed back to fax# [phone]

## 2018-03-24 NOTE — Telephone Encounter (Signed)
It looks like we received something on 03/16/18.  Do you still have the paperwork?

## 2018-03-28 NOTE — Telephone Encounter (Signed)
Received fax confirmation on fax originally, but sent again via fax with new conformation/SLS

## 2018-04-05 DIAGNOSIS — Z961 Presence of intraocular lens: Secondary | ICD-10-CM | POA: Diagnosis not present

## 2018-04-22 ENCOUNTER — Other Ambulatory Visit: Payer: Self-pay | Admitting: Family Medicine

## 2018-04-22 DIAGNOSIS — E039 Hypothyroidism, unspecified: Secondary | ICD-10-CM

## 2018-04-25 ENCOUNTER — Other Ambulatory Visit: Payer: Self-pay | Admitting: Family Medicine

## 2018-04-25 DIAGNOSIS — M792 Neuralgia and neuritis, unspecified: Secondary | ICD-10-CM

## 2018-05-11 ENCOUNTER — Telehealth: Payer: Self-pay | Admitting: *Deleted

## 2018-05-11 NOTE — Telephone Encounter (Signed)
Received Notice of Denial of Medical Coverage for: Special Power Wheelchair [made for persons with nerve or muscle disease], the request for the Financial planner Device cannot be approved by Medicare; however, the Power Wheelchair has been Approved as a covered service under pt's health plan; forwarded to provider/SLS 11/27

## 2018-05-23 NOTE — Telephone Encounter (Signed)
Pt's insurance company called in to make provider aware that pt's wheelchair could be approved if she fax over documentation showing why it is needed.   Fax: [phone]

## 2018-05-24 NOTE — Telephone Encounter (Signed)
OV notes faxed over to insurance company.

## 2018-06-02 DIAGNOSIS — M810 Age-related osteoporosis without current pathological fracture: Secondary | ICD-10-CM | POA: Diagnosis not present

## 2018-06-02 DIAGNOSIS — D126 Benign neoplasm of colon, unspecified: Secondary | ICD-10-CM | POA: Diagnosis not present

## 2018-06-02 DIAGNOSIS — G8114 Spastic hemiplegia affecting left nondominant side: Secondary | ICD-10-CM | POA: Diagnosis not present

## 2018-06-02 DIAGNOSIS — M1990 Unspecified osteoarthritis, unspecified site: Secondary | ICD-10-CM | POA: Diagnosis not present

## 2018-06-02 DIAGNOSIS — G8194 Hemiplegia, unspecified affecting left nondominant side: Secondary | ICD-10-CM | POA: Diagnosis not present

## 2018-06-13 ENCOUNTER — Telehealth: Payer: Self-pay | Admitting: *Deleted

## 2018-06-13 NOTE — Telephone Encounter (Signed)
Left message on machine to call back.  Wanting to check to see if she got her new power wheelchair yet.

## 2018-06-13 NOTE — Telephone Encounter (Signed)
Patient states she received her chair 12/19 but the joy stick does not work right and she is waiting to hear from [facility].

## 2018-07-22 ENCOUNTER — Other Ambulatory Visit: Payer: Self-pay | Admitting: Family Medicine

## 2018-07-22 DIAGNOSIS — I10 Essential (primary) hypertension: Secondary | ICD-10-CM

## 2018-07-26 ENCOUNTER — Other Ambulatory Visit: Payer: Self-pay | Admitting: Family Medicine

## 2018-07-26 DIAGNOSIS — I10 Essential (primary) hypertension: Secondary | ICD-10-CM

## 2018-08-10 DIAGNOSIS — H1132 Conjunctival hemorrhage, left eye: Secondary | ICD-10-CM | POA: Diagnosis not present

## 2018-09-12 ENCOUNTER — Other Ambulatory Visit: Payer: Self-pay | Admitting: Family Medicine

## 2018-09-12 DIAGNOSIS — M792 Neuralgia and neuritis, unspecified: Secondary | ICD-10-CM

## 2018-09-12 DIAGNOSIS — I10 Essential (primary) hypertension: Secondary | ICD-10-CM

## 2018-09-12 DIAGNOSIS — E039 Hypothyroidism, unspecified: Secondary | ICD-10-CM

## 2018-11-10 ENCOUNTER — Other Ambulatory Visit: Payer: Self-pay | Admitting: Family Medicine

## 2018-11-10 DIAGNOSIS — Z1231 Encounter for screening mammogram for malignant neoplasm of breast: Secondary | ICD-10-CM

## 2018-11-21 ENCOUNTER — Telehealth: Payer: Self-pay | Admitting: Family Medicine

## 2018-11-21 DIAGNOSIS — R296 Repeated falls: Secondary | ICD-10-CM

## 2018-11-21 DIAGNOSIS — R2681 Unsteadiness on feet: Secondary | ICD-10-CM

## 2018-11-21 NOTE — Telephone Encounter (Signed)
Copied from [place] [phone]. Topic: General - Other >> Nov 21, 2018 11:21 AM Pauline Good wrote: Reason for CRM: pt need a prescription for another [facility] because the one she has is worn out. Please advise pt.

## 2018-11-22 ENCOUNTER — Other Ambulatory Visit: Payer: Self-pay

## 2018-11-22 DIAGNOSIS — T148XXA Other injury of unspecified body region, initial encounter: Secondary | ICD-10-CM

## 2018-11-22 DIAGNOSIS — R2681 Unsteadiness on feet: Secondary | ICD-10-CM

## 2018-11-22 DIAGNOSIS — R296 Repeated falls: Secondary | ICD-10-CM

## 2018-11-22 NOTE — Telephone Encounter (Signed)
Ok to give rx

## 2018-11-22 NOTE — Telephone Encounter (Signed)
Please advise 

## 2018-11-23 NOTE — Telephone Encounter (Signed)
rx faxed over to [facility].

## 2018-11-23 NOTE — Progress Notes (Signed)
ERROR-- close encounter, Rx for new walker sent yesterday 11/22/2018

## 2018-12-02 ENCOUNTER — Telehealth: Payer: Self-pay

## 2018-12-02 NOTE — Telephone Encounter (Signed)
Received [place] [employer] request back. We missed checking off #19 & #20 for completion. Completed and faxed back and received confirmation. Sent to scan.

## 2018-12-09 DIAGNOSIS — R296 Repeated falls: Secondary | ICD-10-CM | POA: Diagnosis not present

## 2018-12-09 DIAGNOSIS — R2681 Unsteadiness on feet: Secondary | ICD-10-CM | POA: Diagnosis not present

## 2018-12-09 DIAGNOSIS — G839 Paralytic syndrome, unspecified: Secondary | ICD-10-CM | POA: Diagnosis not present

## 2018-12-13 ENCOUNTER — Other Ambulatory Visit: Payer: Self-pay

## 2018-12-13 ENCOUNTER — Ambulatory Visit (INDEPENDENT_AMBULATORY_CARE_PROVIDER_SITE_OTHER): Payer: Medicare Other | Admitting: Family Medicine

## 2018-12-13 ENCOUNTER — Encounter: Payer: Self-pay | Admitting: Family Medicine

## 2018-12-13 VITALS — BP 118/53 | HR 74 | Temp 98.3°F | Resp 18 | Ht 60.0 in

## 2018-12-13 DIAGNOSIS — Z0001 Encounter for general adult medical examination with abnormal findings: Secondary | ICD-10-CM

## 2018-12-13 DIAGNOSIS — E2839 Other primary ovarian failure: Secondary | ICD-10-CM

## 2018-12-13 DIAGNOSIS — K469 Unspecified abdominal hernia without obstruction or gangrene: Secondary | ICD-10-CM

## 2018-12-13 DIAGNOSIS — E785 Hyperlipidemia, unspecified: Secondary | ICD-10-CM | POA: Diagnosis not present

## 2018-12-13 DIAGNOSIS — Z0000 Encounter for general adult medical examination without abnormal findings: Secondary | ICD-10-CM

## 2018-12-13 DIAGNOSIS — E039 Hypothyroidism, unspecified: Secondary | ICD-10-CM

## 2018-12-13 DIAGNOSIS — K219 Gastro-esophageal reflux disease without esophagitis: Secondary | ICD-10-CM

## 2018-12-13 DIAGNOSIS — M79672 Pain in left foot: Secondary | ICD-10-CM

## 2018-12-13 DIAGNOSIS — Z01810 Encounter for preprocedural cardiovascular examination: Secondary | ICD-10-CM | POA: Diagnosis not present

## 2018-12-13 LAB — COMPREHENSIVE METABOLIC PANEL
ALT: 16 U/L (ref 0–35)
AST: 14 U/L (ref 0–37)
Albumin: 3.8 g/dL (ref 3.5–5.2)
Alkaline Phosphatase: 139 U/L — ABNORMAL HIGH (ref 39–117)
BUN: 8 mg/dL (ref 6–23)
CO2: 30 mEq/L (ref 19–32)
Calcium: 8.7 mg/dL (ref 8.4–10.5)
Chloride: 105 mEq/L (ref 96–112)
Creatinine, Ser: 0.45 mg/dL (ref 0.40–1.20)
GFR: 137.84 mL/min (ref >60.00)
Glucose, Bld: 109 mg/dL — ABNORMAL HIGH (ref 70–99)
Potassium: 4.6 mEq/L (ref 3.5–5.1)
Sodium: 142 mEq/L (ref 135–145)
Total Bilirubin: 0.9 mg/dL (ref 0.2–1.2)
Total Protein: 6.6 g/dL (ref 6.0–8.3)

## 2018-12-13 LAB — LIPID PANEL
Cholesterol: 120 mg/dL (ref 0–200)
HDL: 34 mg/dL — ABNORMAL LOW (ref >39.00)
LDL Cholesterol: 60 mg/dL (ref 0–99)
NonHDL: 86.21
Total CHOL/HDL Ratio: 4
Triglycerides: 131 mg/dL (ref 0.0–149.0)
VLDL: 26.2 mg/dL (ref 0.0–40.0)

## 2018-12-13 LAB — TSH: TSH: 2.85 u[IU]/mL (ref 0.35–4.50)

## 2018-12-13 MED ORDER — FAMOTIDINE 20 MG PO TABS: 20.0000 mg | ORAL_TABLET | Freq: Two times a day (BID) | ORAL | 2 refills | Status: DC
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
## 2018-12-13 NOTE — Patient Instructions (Signed)
Preventive Care 70 Years and Older, Female Preventive care refers to lifestyle choices and visits with your health care provider that can promote health and wellness. This includes:  A yearly physical exam. This is also called an annual well check.  Regular dental and eye exams.  Immunizations.  Screening for certain conditions.  Healthy lifestyle choices, such as diet and exercise. What can I expect for my preventive care visit? Physical exam Your health care provider will check:  Height and weight. These may be used to calculate body mass index (BMI), which is a measurement that tells if you are at a healthy weight.  Heart rate and blood pressure.  Your skin for abnormal spots. Counseling Your health care provider may ask you questions about:  Alcohol, tobacco, and drug use.  Emotional well-being.  Home and relationship well-being.  Sexual activity.  Eating habits.  History of falls.  Memory and ability to understand (cognition).  Work and work Statistician.  Pregnancy and menstrual history. What immunizations do I need?  Influenza (flu) vaccine  This is recommended every year. Tetanus, diphtheria, and pertussis (Tdap) vaccine  You may need a Td booster every 10 years. Varicella (chickenpox) vaccine  You may need this vaccine if you have not already been vaccinated. Zoster (shingles) vaccine  You may need this after age 70. Pneumococcal conjugate (PCV13) vaccine  One dose is recommended after age 70. Pneumococcal polysaccharide (PPSV23) vaccine  One dose is recommended after age 70. Measles, mumps, and rubella (MMR) vaccine  You may need at least one dose of MMR if you were born in 1957 or later. You may also need a second dose. Meningococcal conjugate (MenACWY) vaccine  You may need this if you have certain conditions. Hepatitis A vaccine  You may need this if you have certain conditions or if you travel or work in places where you may be exposed  to hepatitis A. Hepatitis B vaccine  You may need this if you have certain conditions or if you travel or work in places where you may be exposed to hepatitis B. Haemophilus influenzae type b (Hib) vaccine  You may need this if you have certain conditions. You may receive vaccines as individual doses or as more than one vaccine together in one shot (combination vaccines). Talk with your health care provider about the risks and benefits of combination vaccines. What tests do I need? Blood tests  Lipid and cholesterol levels. These may be checked every 5 years, or more frequently depending on your overall health.  Hepatitis C test.  Hepatitis B test. Screening  Lung cancer screening. You may have this screening every year starting at age 70 if you have a 30-pack-year history of smoking and currently smoke or have quit within the past 15 years.  Colorectal cancer screening. All adults should have this screening starting at age 70 until age 15 and continuing until age 15. Your health care provider may recommend screening at age 70 if you are at increased risk. You will have tests every 1-10 years, depending on your results and the type of screening test.  Diabetes screening. This is done by checking your blood sugar (glucose) after you have not eaten for a while (fasting). You may have this done every 1-3 years.  Mammogram. This may be done every 1-2 years. Talk with your health care provider about how often you should have regular mammograms.  BRCA-related cancer screening. This may be done if you have a family history of breast, ovarian, tubal, or peritoneal cancers.  Other tests  Sexually transmitted disease (STD) testing.  Bone density scan. This is done to screen for osteoporosis. You may have this done starting at age 76. Follow these instructions at home: Eating and drinking  Eat a diet that includes fresh fruits and vegetables, whole grains, lean protein, and low-fat dairy products. Limit  your intake of foods with high amounts of sugar, saturated fats, and salt.  Take vitamin and mineral supplements as recommended by your health care provider.  Do not drink alcohol if your health care provider tells you not to drink.  If you drink alcohol: ? Limit how much you have to 0-1 drink a day. ? Be aware of how much alcohol is in your drink. In the [place] one drink equals one 12 oz bottle of beer (355 mL), one 5 oz glass of wine (148 mL), or one 1 oz glass of hard liquor (44 mL). Lifestyle  Take daily care of your teeth and gums.  Stay active. Exercise for at least 30 minutes on 5 or more days each week.  Do not use any products that contain nicotine or tobacco, such as cigarettes, e-cigarettes, and chewing tobacco. If you need help quitting, ask your health care provider.  If you are sexually active, practice safe sex. Use a condom or other form of protection in order to prevent STIs (sexually transmitted infections).  Talk with your health care provider about taking a low-dose aspirin or statin. What's next?  Go to your health care provider once a year for a well check visit.  Ask your health care provider how often you should have your eyes and teeth checked.  Stay up to date on all vaccines. This information is not intended to replace advice given to you by your health care provider. Make sure you discuss any questions you have with your health care provider. Document Released: 06/28/2015 Document Revised: 05/26/2018 Document Reviewed: 05/26/2018 Elsevier Patient Education  2020 [employer].

## 2018-12-27 ENCOUNTER — Other Ambulatory Visit: Payer: Self-pay

## 2018-12-27 ENCOUNTER — Ambulatory Visit
Admission: RE | Admit: 2018-12-27 | Discharge: 2018-12-27 | Disposition: A | Discharge status: Home / Self Care | Payer: Medicare Other | Source: Ambulatory Visit | Attending: Family Medicine | Admitting: Family Medicine

## 2018-12-27 DIAGNOSIS — Z1231 Encounter for screening mammogram for malignant neoplasm of breast: Secondary | ICD-10-CM

## 2019-01-13 ENCOUNTER — Ambulatory Visit (INDEPENDENT_AMBULATORY_CARE_PROVIDER_SITE_OTHER): Payer: Medicare Other | Admitting: Podiatry

## 2019-01-13 ENCOUNTER — Other Ambulatory Visit: Payer: Self-pay

## 2019-01-13 ENCOUNTER — Ambulatory Visit (INDEPENDENT_AMBULATORY_CARE_PROVIDER_SITE_OTHER): Payer: Medicare Other

## 2019-01-13 DIAGNOSIS — S92902S Unspecified fracture of left foot, sequela: Secondary | ICD-10-CM

## 2019-01-13 DIAGNOSIS — Q828 Other specified congenital malformations of skin: Secondary | ICD-10-CM | POA: Diagnosis not present

## 2019-01-13 DIAGNOSIS — M779 Enthesopathy, unspecified: Secondary | ICD-10-CM

## 2019-01-13 DIAGNOSIS — M21372 Foot drop, left foot: Secondary | ICD-10-CM

## 2019-01-13 DIAGNOSIS — S9032XA Contusion of left foot, initial encounter: Secondary | ICD-10-CM | POA: Diagnosis not present

## 2019-01-13 DIAGNOSIS — G8194 Hemiplegia, unspecified affecting left nondominant side: Secondary | ICD-10-CM | POA: Diagnosis not present

## 2019-01-13 MED ORDER — DICLOFENAC SODIUM 1 % TD GEL: 2.0000 g | Freq: Four times a day (QID) | TRANSDERMAL | 2 refills | Status: DC

## 2019-01-13 NOTE — Progress Notes (Signed)
Subjective: 68 year old female presents the office today for concerns of bilateral foot pain.  She states that she still having quite a bit of pain on her right foot submetatarsal 1 along callus.  Also she gets pain to her left foot.  She previously had a steroid injection performed with Dr. Amalia Hailey and she states this was very helpful.  She states the injection performed to the top of the foot.  She states that she has had history of fractures previously and since then she has had pain.  She denies any recent injury or falls or injury. Denies any systemic complaints such as fevers, chills, nausea, vomiting. No acute changes since last appointment, and no other complaints at this time.   Objective: AAO x3, NAD DP/PT pulses palpable bilaterally, CRT less than 3 seconds Majority of tenderness is on the left foot along the dorsal lateral aspect of the midfoot on the Lisfranc joint.  There is no significant edema, erythema to this area.  There is also tenderness on the right foot submetatarsal 1 on the painful callus.  No underlying ulceration, drainage or any signs of infection noted today. No open lesions or pre-ulcerative lesions.  No pain with calf compression, swelling, warmth, erythema  Assessment: Left foot capsulitis, right foot hyperkeratotic lesion   Plan: -All treatment options discussed with the patient including all alternatives, risks, complications.  -X-rays obtained reviewed of the left foot.  No evidence of acute fracture identified.  Shortened fourth metatarsal with significant bunion present. -Injection performed the left foot.  The area was prepped with alcohol and then on the area muscle tenderness and mixture of 1 cc Kenalog 10, 0.5 cc of Marcaine plain, 0.5 cc of lidocaine plain was infiltrated without complications.  Postinjection care discussed. -Debrided hyperkeratotic lesion right foot submetatarsal 1 without any complications or bleeding. -We will have her follow-up with Liliane Channel  for inserts or place as well.  She gets pain to her left foot when she is trying to put weight on it. -Patient encouraged to call the office with any questions, concerns, change in symptoms.   RTC 6 weeks  Trula Slade DPM

## 2019-01-16 ENCOUNTER — Other Ambulatory Visit: Payer: Self-pay | Admitting: General Surgery

## 2019-01-16 ENCOUNTER — Other Ambulatory Visit (HOSPITAL_COMMUNITY): Payer: Self-pay | Admitting: General Surgery

## 2019-01-16 DIAGNOSIS — Z9049 Acquired absence of other specified parts of digestive tract: Secondary | ICD-10-CM | POA: Diagnosis not present

## 2019-01-16 DIAGNOSIS — K436 Other and unspecified ventral hernia with obstruction, without gangrene: Secondary | ICD-10-CM

## 2019-01-16 DIAGNOSIS — I10 Essential (primary) hypertension: Secondary | ICD-10-CM | POA: Diagnosis not present

## 2019-01-16 DIAGNOSIS — G8194 Hemiplegia, unspecified affecting left nondominant side: Secondary | ICD-10-CM | POA: Diagnosis not present

## 2019-01-16 DIAGNOSIS — Z96653 Presence of artificial knee joint, bilateral: Secondary | ICD-10-CM | POA: Diagnosis not present

## 2019-01-17 ENCOUNTER — Ambulatory Visit (HOSPITAL_COMMUNITY): Payer: Medicare Other

## 2019-01-20 ENCOUNTER — Ambulatory Visit (HOSPITAL_COMMUNITY): Admission: RE | Admit: 2019-01-20 | Payer: Medicare Other | Source: Ambulatory Visit

## 2019-01-23 ENCOUNTER — Encounter (HOSPITAL_COMMUNITY): Payer: Self-pay

## 2019-01-23 ENCOUNTER — Ambulatory Visit (HOSPITAL_COMMUNITY)
Admission: RE | Admit: 2019-01-23 | Discharge: 2019-01-23 | Disposition: A | Discharge status: Home / Self Care | Payer: Medicare Other | Source: Ambulatory Visit | Attending: General Surgery | Admitting: General Surgery

## 2019-01-23 ENCOUNTER — Other Ambulatory Visit: Payer: Self-pay

## 2019-01-23 DIAGNOSIS — K436 Other and unspecified ventral hernia with obstruction, without gangrene: Secondary | ICD-10-CM | POA: Insufficient documentation

## 2019-01-23 MED ORDER — IOHEXOL 300 MG/ML  SOLN: 100.0000 mL | Freq: Once | INTRAMUSCULAR | Status: DC | PRN

## 2019-01-23 MED ORDER — SODIUM CHLORIDE (PF) 0.9 % IJ SOLN
INTRAMUSCULAR | Status: AC
  Filled 2019-01-23: qty 50

## 2019-01-24 ENCOUNTER — Other Ambulatory Visit: Payer: Medicare Other | Admitting: Orthotics

## 2019-01-26 ENCOUNTER — Ambulatory Visit (HOSPITAL_COMMUNITY)
Admission: RE | Admit: 2019-01-26 | Discharge: 2019-01-26 | Disposition: A | Discharge status: Home / Self Care | Payer: Medicare Other | Source: Ambulatory Visit | Attending: General Surgery | Admitting: General Surgery

## 2019-01-26 ENCOUNTER — Other Ambulatory Visit: Payer: Self-pay

## 2019-01-26 DIAGNOSIS — K436 Other and unspecified ventral hernia with obstruction, without gangrene: Secondary | ICD-10-CM | POA: Diagnosis not present

## 2019-01-26 DIAGNOSIS — K760 Fatty (change of) liver, not elsewhere classified: Secondary | ICD-10-CM | POA: Diagnosis not present

## 2019-01-26 LAB — POCT I-STAT CREATININE: Creatinine, Ser: 0.5 mg/dL (ref 0.44–1.00)

## 2019-01-26 MED ORDER — IOHEXOL 300 MG/ML  SOLN
100.0000 mL | Freq: Once | INTRAMUSCULAR | Status: AC | PRN
  Administered 2019-01-26: 100 mL via INTRAVENOUS

## 2019-01-26 MED ORDER — IOHEXOL 300 MG/ML  SOLN
30.0000 mL | Freq: Once | INTRAMUSCULAR | Status: AC
  Administered 2019-01-26: 30 mL via ORAL

## 2019-01-26 MED ORDER — SODIUM CHLORIDE (PF) 0.9 % IJ SOLN
INTRAMUSCULAR | Status: AC
  Filled 2019-01-26: qty 50
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
## 2019-02-07 ENCOUNTER — Encounter: Payer: Self-pay | Admitting: *Deleted

## 2019-02-07 ENCOUNTER — Ambulatory Visit (INDEPENDENT_AMBULATORY_CARE_PROVIDER_SITE_OTHER): Payer: Medicare Other | Admitting: *Deleted

## 2019-02-07 VITALS — BP 116/82 | HR 78

## 2019-02-07 DIAGNOSIS — Z0000 Encounter for general adult medical examination without abnormal findings: Secondary | ICD-10-CM

## 2019-02-07 NOTE — Patient Instructions (Addendum)
Please schedule your next medicare wellness visit with me in 1 yr.  Continue to eat heart healthy diet (full of fruits, vegetables, whole grains, lean protein, water--limit salt, fat, and sugar intake) and increase physical activity as tolerated.  Continue doing brain stimulating activities (puzzles, reading, adult coloring books, staying active) to keep memory sharp.    Ms. Kelly [name] , Thank you for taking time to come for your Medicare Wellness Visit. I appreciate your ongoing commitment to your health goals. Please review the following plan we discussed and let me know if I can assist you in the future.   These are the goals we discussed: Goals    . Lose 15lbs in 1 year.   (pt-stated)       This is a list of the screening recommended for you and due dates:  Health Maintenance  Topic Date Due  . Flu Shot  01/14/2019  . Colon Cancer Screening  06/27/2020  . Mammogram  12/26/2020  . Tetanus Vaccine  10/30/2023  . DEXA scan (bone density measurement)  Completed  .  Hepatitis C: One time screening is recommended by Center for Disease Control  (CDC) for  adults born from [birth year] through 1967.   Completed  . Pneumonia vaccines  Completed    Health Maintenance After Age 80 After age 21, you are at a higher risk for certain long-term diseases and infections as well as injuries from falls. Falls are a major cause of broken bones and head injuries in people who are older than age 60. Getting regular preventive care can help to keep you healthy and well. Preventive care includes getting regular testing and making lifestyle changes as recommended by your health care provider. Talk with your health care provider about:  Which screenings and tests you should have. A screening is a test that checks for a disease when you have no symptoms.  A diet and exercise plan that is right for you. What should I know about screenings and tests to prevent falls? Screening and testing are the best ways to find a  health problem early. Early diagnosis and treatment give you the best chance of managing medical conditions that are common after age 70. Certain conditions and lifestyle choices may make you more likely to have a fall. Your health care provider may recommend:  Regular vision checks. Poor vision and conditions such as cataracts can make you more likely to have a fall. If you wear glasses, make sure to get your prescription updated if your vision changes.  Medicine review. Work with your health care provider to regularly review all of the medicines you are taking, including over-the-counter medicines. Ask your health care provider about any side effects that may make you more likely to have a fall. Tell your health care provider if any medicines that you take make you feel dizzy or sleepy.  Osteoporosis screening. Osteoporosis is a condition that causes the bones to get weaker. This can make the bones weak and cause them to break more easily.  Blood pressure screening. Blood pressure changes and medicines to control blood pressure can make you feel dizzy.  Strength and balance checks. Your health care provider may recommend certain tests to check your strength and balance while standing, walking, or changing positions.  Foot health exam. Foot pain and numbness, as well as not wearing proper footwear, can make you more likely to have a fall.  Depression screening. You may be more likely to have a fall if you have  a fear of falling, feel emotionally low, or feel unable to do activities that you used to do.  Alcohol use screening. Using too much alcohol can affect your balance and may make you more likely to have a fall. What actions can I take to lower my risk of falls? General instructions  Talk with your health care provider about your risks for falling. Tell your health care provider if: ? You fall. Be sure to tell your health care provider about all falls, even ones that seem minor. ? You feel  dizzy, sleepy, or off-balance.  Take over-the-counter and prescription medicines only as told by your health care provider. These include any supplements.  Eat a healthy diet and maintain a healthy weight. A healthy diet includes low-fat dairy products, low-fat (lean) meats, and fiber from whole grains, beans, and lots of fruits and vegetables. Home safety  Remove any tripping hazards, such as rugs, cords, and clutter.  Install safety equipment such as grab bars in bathrooms and safety rails on stairs.  Keep rooms and walkways well-lit. Activity   Follow a regular exercise program to stay fit. This will help you maintain your balance. Ask your health care provider what types of exercise are appropriate for you.  If you need a cane or walker, use it as recommended by your health care provider.  Wear supportive shoes that have nonskid soles. Lifestyle  Do not drink alcohol if your health care provider tells you not to drink.  If you drink alcohol, limit how much you have: ? 0-1 drink a day for women. ? 0-2 drinks a day for men.  Be aware of how much alcohol is in your drink. In the [place] one drink equals one typical bottle of beer (12 oz), one-half glass of wine (5 oz), or one shot of hard liquor (1 oz).  Do not use any products that contain nicotine or tobacco, such as cigarettes and e-cigarettes. If you need help quitting, ask your health care provider. Summary  Having a healthy lifestyle and getting preventive care can help to protect your health and wellness after age 81.  Screening and testing are the best way to find a health problem early and help you avoid having a fall. Early diagnosis and treatment give you the best chance for managing medical conditions that are more common for people who are older than age 28.  Falls are a major cause of broken bones and head injuries in people who are older than age 2. Take precautions to prevent a fall at home.  Work with your  health care provider to learn what changes you can make to improve your health and wellness and to prevent falls. This information is not intended to replace advice given to you by your health care provider. Make sure you discuss any questions you have with your health care provider. Document Released: 04/14/2017 Document Revised: 09/22/2018 Document Reviewed: 04/14/2017 Elsevier Patient Education  2020 [employer].

## 2019-02-08 ENCOUNTER — Other Ambulatory Visit: Payer: Self-pay

## 2019-02-09 ENCOUNTER — Ambulatory Visit (INDEPENDENT_AMBULATORY_CARE_PROVIDER_SITE_OTHER): Payer: Medicare Other | Admitting: Family Medicine

## 2019-02-09 ENCOUNTER — Encounter: Payer: Self-pay | Admitting: Family Medicine

## 2019-02-09 VITALS — BP 123/64 | HR 94 | Temp 97.8°F | Resp 16 | Ht 60.0 in | Wt 261.0 lb

## 2019-02-09 DIAGNOSIS — L089 Local infection of the skin and subcutaneous tissue, unspecified: Secondary | ICD-10-CM

## 2019-02-09 DIAGNOSIS — L723 Sebaceous cyst: Secondary | ICD-10-CM

## 2019-02-09 MED ORDER — CEPHALEXIN 500 MG PO CAPS: 500.0000 mg | ORAL_CAPSULE | Freq: Three times a day (TID) | ORAL | 0 refills | Status: DC
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
## 2019-02-09 NOTE — Patient Instructions (Addendum)
It was good to see you today-  We drained a cyst on your right labia today.  It seems to be a blocked oil duct, or sebaceous cyst, that got infected Please keep a gauze or pad in your underwear to protect your clothing until all oozing is resolved.  If you continue to have any significant bleeding beyond an hour or so please alert [place] or otherwise seek care!  Warm compresses may help the area to continue to drain   We will use an antibiotic- keflex- three times a day for a week to help clear the infection  Please let me know if you have any concerns or if you are not healing as expected

## 2019-02-14 ENCOUNTER — Other Ambulatory Visit: Payer: Self-pay | Admitting: Podiatry

## 2019-03-02 ENCOUNTER — Ambulatory Visit
Admission: RE | Admit: 2019-03-02 | Discharge: 2019-03-02 | Disposition: A | Discharge status: Home / Self Care | Payer: Medicare Other | Source: Ambulatory Visit | Attending: Family Medicine | Admitting: Family Medicine

## 2019-03-02 ENCOUNTER — Other Ambulatory Visit: Payer: Self-pay

## 2019-03-02 DIAGNOSIS — E2839 Other primary ovarian failure: Secondary | ICD-10-CM

## 2019-03-02 DIAGNOSIS — Z13820 Encounter for screening for osteoporosis: Secondary | ICD-10-CM | POA: Diagnosis not present

## 2019-03-07 ENCOUNTER — Telehealth: Payer: Self-pay | Admitting: Family Medicine

## 2019-03-07 MED ORDER — DOXYCYCLINE HYCLATE 100 MG PO TABS: 100.0000 mg | ORAL_TABLET | Freq: Two times a day (BID) | ORAL | 0 refills | Status: DC

## 2019-03-07 NOTE — Telephone Encounter (Signed)
I did not call her but I did send you the phone message. Pt has a returning genital area cyst and is requesting a abx. Pt seen by Copland 02/10/2019 for this concern.

## 2019-03-07 NOTE — Telephone Encounter (Signed)
Pt notified of abx. Informed patient she may need to be referred to GYN. Asked patient to let us know how she feels once abx is done.

## 2019-03-07 NOTE — Telephone Encounter (Signed)
Doxycycline 100 mg bid x 10 days  Dr copland had difficulty with it-- we may need to refer to gyn if it con't

## 2019-03-07 NOTE — Telephone Encounter (Signed)
Pt had a missed call from practice. Answered the phone but it disconnected. Requesting callback from whoever tried to reach her. Please advise.

## 2019-03-07 NOTE — Telephone Encounter (Signed)
She was seen by Copland on 02/10/2019. Please advise

## 2019-03-07 NOTE — Telephone Encounter (Signed)
Pt states she has returning cyst in genital area, and would like to begin taking abx to treat/prevent infection.  Please call pt to advise:   [phone]  Pharmacy:  [facility], [place] - [place] [phone] (Phone) [phone] (Fax)

## 2019-03-07 NOTE — Telephone Encounter (Signed)
There is no message from anyone from here--- does she need something?

## 2019-03-10 ENCOUNTER — Other Ambulatory Visit: Payer: Self-pay

## 2019-03-10 ENCOUNTER — Ambulatory Visit (INDEPENDENT_AMBULATORY_CARE_PROVIDER_SITE_OTHER): Payer: Medicare Other

## 2019-03-10 DIAGNOSIS — Z23 Encounter for immunization: Secondary | ICD-10-CM

## 2019-03-10 NOTE — Progress Notes (Signed)
Need flu shot

## 2019-03-27 ENCOUNTER — Ambulatory Visit (INDEPENDENT_AMBULATORY_CARE_PROVIDER_SITE_OTHER): Payer: Medicare Other | Admitting: Family Medicine

## 2019-03-27 ENCOUNTER — Ambulatory Visit (HOSPITAL_BASED_OUTPATIENT_CLINIC_OR_DEPARTMENT_OTHER)
Admission: RE | Admit: 2019-03-27 | Discharge: 2019-03-27 | Disposition: A | Discharge status: Home / Self Care | Payer: Medicare Other | Source: Ambulatory Visit | Attending: Family Medicine | Admitting: Family Medicine

## 2019-03-27 ENCOUNTER — Encounter: Payer: Self-pay | Admitting: Family Medicine

## 2019-03-27 ENCOUNTER — Other Ambulatory Visit: Payer: Self-pay

## 2019-03-27 VITALS — BP 152/72 | HR 105 | Temp 97.2°F | Resp 12 | Ht 60.0 in | Wt 272.8 lb

## 2019-03-27 DIAGNOSIS — S66419A Strain of intrinsic muscle, fascia and tendon of unspecified thumb at wrist and hand level, initial encounter: Secondary | ICD-10-CM

## 2019-03-27 DIAGNOSIS — M79641 Pain in right hand: Secondary | ICD-10-CM | POA: Diagnosis not present

## 2019-03-27 DIAGNOSIS — M7989 Other specified soft tissue disorders: Secondary | ICD-10-CM | POA: Diagnosis not present

## 2019-03-27 MED ORDER — PREDNISONE 10 MG PO TABS: ORAL_TABLET | ORAL | 0 refills | Status: DC

## 2019-03-27 NOTE — Patient Instructions (Signed)
Thumb Sprain    A thumb sprain is an injury to one of the bands of tissue (ligaments) that connect the bones in your thumb. The ligament may be stretched too much, or it may be torn. A tear can be either partial or complete. How bad, or severe, the sprain is depends on how much of the ligament was damaged or torn.  What are the causes?  A thumb sprain is often caused by a fall or an accident, such as when you hold your hands out to catch something or to protect yourself.  What increases the risk?  This injury is more likely to occur in people who play sports that involve:  · A risk of falling, such as skiing.  · Catching an object, such as basketball.  What are the signs or symptoms?  Symptoms of this condition include:  · Not being able to move the thumb normally.  · Swelling.  · Tenderness.  · Bruising.  How is this diagnosed?  This condition may be diagnosed based on:  · Your symptoms and medical history. Your health care provider may ask about any recent injuries to your thumb.  · A physical exam.  · Imaging studies such as X-ray, ultrasound, or MRI.  How is this treated?  Treatment for this condition depends on how severe your sprain is.  · If your ligament is overstretched or partially torn, treatment usually involves keeping your thumb in a fixed position (immobilization) for at least 4 to 6 weeks. Your health care provider will apply a bandage, cast, or splint to keep your thumb from moving until it heals.  · If your ligament is fully torn, you may need surgery to reconnect the ligament to the bone. After surgery, you will need to wear a cast or splint on your thumb.  Your health care provider may also recommend physical therapy to strengthen your thumb.  Follow these instructions at home:  If you have a splint or bandage:  · Wear the splint or bandage as told by your health care provider. Remove it only as told by your health care provider.  · Loosen the splint or bandage if your thumb or fingers tingle,  become numb, or turn cold and blue.  · Keep the splint or bandage clean and dry.  If you have a cast:  · Do not stick anything inside the cast to scratch your skin. Doing that increases your risk of infection.  · Check the skin around the cast every day. Tell your health care provider about any concerns.  · You may put lotion on dry skin around the edges of the cast. Do not put lotion on the skin underneath the cast.  · Keep the cast clean and dry.  Bathing  · Do not take baths, swim, or use a hot tub until your health care provider approves. Ask your health care provider if you may take showers. You may only be allowed to take sponge baths.  · If your splint, bandage, or cast is not waterproof:  ? Do not let it get wet.  ? Cover it with a watertight covering to protect it from water when you take a bath or shower.  Managing pain, stiffness, and swelling    · If directed, put ice on your thumb:  ? If you have a removable splint, remove it as told by your health care provider.  ? Put ice in a plastic bag.  ? Place a towel between your skin   while you are sitting or lying down. Activity  Return to your normal activities as told by your health care provider. Ask your health care provider what activities are safe for you.  Do physical therapy exercises as directed. After your splint or cast is removed, your health care provider may recommend that you: ? Move your thumb in circles. ? Touch your thumb to your pinky finger. ? Do these exercises several times a day.  Ask your health care provider if you may use a hand exerciser to strengthen your muscles.  If your thumb feels stiff while you are exercising it, try doing the exercises while soaking your hand in warm water. Driving  Do  not drive until your health care provider approves.  Donot drive or use heavy machinery while taking prescription pain medicine. General instructions  Do not put pressure on any part of the cast or splint until it is fully hardened, if applicable. This may take several hours.  Take over-the-counter and prescription medicines only as told by your health care provider.  Do not use any products that contain nicotine or tobacco, such as cigarettes and e-cigarettes. These can delay healing. If you need help quitting, ask your health care provider.  Do not wear rings on your injured thumb.  Keep all follow-up visits as told by your health care provider. This is important. Contact a health care provider if you have:  Pain that gets worse or does not get better with medicine.  Bruising or swelling that gets worse.  Your cast or splint is damaged. Get help right away if:  Your thumb feels numb, tingles, turns cold, or turns blue, even after loosening your splint or bandage (if applicable). Summary  A thumb sprain is an injury to one of the bands of tissue (ligaments) that connect the bones in your thumb.  Thumb sprains are more likely to occur in people who play sports that involve a risk of falling or having to catch an object.  Treatment will depend on how severe the sprain is, but it will require keeping the thumb in a fixed position. It might require surgery.  Make sure you understand and follow all of your health care provider's instructions for home care. This information is not intended to replace advice given to you by your health care provider. Make sure you discuss any questions you have with your health care provider. Document Released: 07/09/2004 Document Revised: 06/24/2017 Document Reviewed: 06/24/2017 Elsevier Patient Education  2020 [employer].
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
## 2019-04-07 ENCOUNTER — Other Ambulatory Visit: Payer: Self-pay | Admitting: Family Medicine

## 2019-04-07 DIAGNOSIS — K219 Gastro-esophageal reflux disease without esophagitis: Secondary | ICD-10-CM

## 2019-04-10 DIAGNOSIS — Z961 Presence of intraocular lens: Secondary | ICD-10-CM | POA: Diagnosis not present

## 2019-06-14 ENCOUNTER — Other Ambulatory Visit: Payer: Self-pay

## 2019-06-14 ENCOUNTER — Ambulatory Visit (INDEPENDENT_AMBULATORY_CARE_PROVIDER_SITE_OTHER): Payer: Medicare Other | Admitting: Family Medicine

## 2019-06-14 ENCOUNTER — Encounter: Payer: Self-pay | Admitting: Family Medicine

## 2019-06-14 VITALS — BP 126/61 | HR 80 | Temp 97.6°F | Ht 60.0 in | Wt 264.0 lb

## 2019-06-14 DIAGNOSIS — M792 Neuralgia and neuritis, unspecified: Secondary | ICD-10-CM | POA: Diagnosis not present

## 2019-06-14 DIAGNOSIS — K219 Gastro-esophageal reflux disease without esophagitis: Secondary | ICD-10-CM

## 2019-06-14 DIAGNOSIS — E039 Hypothyroidism, unspecified: Secondary | ICD-10-CM | POA: Diagnosis not present

## 2019-06-14 DIAGNOSIS — E785 Hyperlipidemia, unspecified: Secondary | ICD-10-CM | POA: Diagnosis not present

## 2019-06-14 DIAGNOSIS — I10 Essential (primary) hypertension: Secondary | ICD-10-CM | POA: Diagnosis not present

## 2019-06-14 MED ORDER — ATORVASTATIN CALCIUM 20 MG PO TABS: 20.0000 mg | ORAL_TABLET | Freq: Every day | ORAL | 3 refills | Status: DC

## 2019-06-14 MED ORDER — GABAPENTIN 100 MG PO CAPS: 100.0000 mg | ORAL_CAPSULE | Freq: Two times a day (BID) | ORAL | 3 refills | Status: DC

## 2019-06-14 MED ORDER — SYNTHROID 75 MCG PO TABS: ORAL_TABLET | ORAL | 3 refills | Status: DC

## 2019-06-14 MED ORDER — FAMOTIDINE 20 MG PO TABS: 20.0000 mg | ORAL_TABLET | Freq: Two times a day (BID) | ORAL | 3 refills | Status: DC
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
## 2019-06-15 ENCOUNTER — Other Ambulatory Visit: Payer: Self-pay | Admitting: *Deleted

## 2019-06-15 DIAGNOSIS — E039 Hypothyroidism, unspecified: Secondary | ICD-10-CM

## 2019-06-15 MED ORDER — SYNTHROID 75 MCG PO TABS: 75.0000 ug | ORAL_TABLET | Freq: Every day | ORAL | 3 refills | Status: DC

## 2019-06-15 NOTE — Telephone Encounter (Signed)
Received fax from [place] requesting verification of directions " 1 po qhs".  Rx re-sent.

## 2019-07-03 ENCOUNTER — Ambulatory Visit (INDEPENDENT_AMBULATORY_CARE_PROVIDER_SITE_OTHER): Payer: Medicare Other

## 2019-07-03 ENCOUNTER — Ambulatory Visit (INDEPENDENT_AMBULATORY_CARE_PROVIDER_SITE_OTHER): Payer: Medicare Other | Admitting: Podiatry

## 2019-07-03 ENCOUNTER — Other Ambulatory Visit: Payer: Self-pay

## 2019-07-03 DIAGNOSIS — M778 Other enthesopathies, not elsewhere classified: Secondary | ICD-10-CM | POA: Diagnosis not present

## 2019-07-03 DIAGNOSIS — L989 Disorder of the skin and subcutaneous tissue, unspecified: Secondary | ICD-10-CM

## 2019-07-03 MED ORDER — MELOXICAM 15 MG PO TABS: 15.0000 mg | ORAL_TABLET | Freq: Every day | ORAL | 1 refills | Status: DC

## 2019-07-06 ENCOUNTER — Other Ambulatory Visit: Payer: Self-pay | Admitting: Family Medicine

## 2019-07-06 ENCOUNTER — Other Ambulatory Visit: Payer: Self-pay | Admitting: Podiatry

## 2019-07-06 DIAGNOSIS — I10 Essential (primary) hypertension: Secondary | ICD-10-CM

## 2019-07-06 DIAGNOSIS — M778 Other enthesopathies, not elsewhere classified: Secondary | ICD-10-CM
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
## 2019-09-01 ENCOUNTER — Other Ambulatory Visit: Payer: Self-pay | Admitting: Podiatry

## 2019-10-11 DIAGNOSIS — M810 Age-related osteoporosis without current pathological fracture: Secondary | ICD-10-CM | POA: Diagnosis not present

## 2019-10-11 DIAGNOSIS — M1990 Unspecified osteoarthritis, unspecified site: Secondary | ICD-10-CM | POA: Diagnosis not present

## 2019-10-11 DIAGNOSIS — D126 Benign neoplasm of colon, unspecified: Secondary | ICD-10-CM | POA: Diagnosis not present

## 2019-10-11 DIAGNOSIS — R609 Edema, unspecified: Secondary | ICD-10-CM | POA: Diagnosis not present

## 2019-10-17 ENCOUNTER — Other Ambulatory Visit: Payer: Self-pay | Admitting: Family Medicine

## 2019-10-17 DIAGNOSIS — I10 Essential (primary) hypertension: Secondary | ICD-10-CM

## 2019-10-27 ENCOUNTER — Other Ambulatory Visit: Payer: Self-pay | Admitting: Podiatry

## 2019-11-17 ENCOUNTER — Other Ambulatory Visit: Payer: Self-pay | Admitting: Family Medicine

## 2019-11-17 DIAGNOSIS — Z1231 Encounter for screening mammogram for malignant neoplasm of breast: Secondary | ICD-10-CM

## 2019-12-04 ENCOUNTER — Ambulatory Visit: Payer: Medicare Other | Admitting: Podiatry

## 2019-12-05 ENCOUNTER — Other Ambulatory Visit: Payer: Self-pay

## 2019-12-05 ENCOUNTER — Encounter: Payer: Self-pay | Admitting: Podiatry

## 2019-12-05 ENCOUNTER — Ambulatory Visit (INDEPENDENT_AMBULATORY_CARE_PROVIDER_SITE_OTHER): Payer: Medicare Other | Admitting: Podiatry

## 2019-12-05 DIAGNOSIS — M79671 Pain in right foot: Secondary | ICD-10-CM | POA: Diagnosis not present

## 2019-12-05 DIAGNOSIS — Q828 Other specified congenital malformations of skin: Secondary | ICD-10-CM | POA: Diagnosis not present

## 2019-12-05 NOTE — Patient Instructions (Addendum)
Both can be purchased on [employer]:  Felt callus pads (U-shape or Circular); make sure they're non-medicated and does not contain any type of acid.  Dr. Marlaine Hind Gel Callus Cushion (blue; self-sticking and reusable)  Corns and Calluses Corns are small areas of thickened skin that occur on the top, sides, or tip of a toe. They contain a cone-shaped core with a point that can press on a nerve below. This causes pain.  Calluses are areas of thickened skin that can occur anywhere on the body, including the hands, fingers, palms, soles of the feet, and heels. Calluses are usually larger than corns. What are the causes? Corns and calluses are caused by rubbing (friction) or pressure, such as from shoes that are too tight or do not fit properly. What increases the risk? Corns are more likely to develop in people who have misshapen toes (toe deformities), such as hammer toes. Calluses can occur with friction to any area of the skin. They are more likely to develop in people who:  Work with their hands.  Wear shoes that fit poorly, are too tight, or are high-heeled.  Have toe deformities. What are the signs or symptoms? Symptoms of a corn or callus include:  A hard growth on the skin.  Pain or tenderness under the skin.  Redness and swelling.  Increased discomfort while wearing tight-fitting shoes, if your feet are affected. If a corn or callus becomes infected, symptoms may include:  Redness and swelling that gets worse.  Pain.  Fluid, blood, or pus draining from the corn or callus. How is this diagnosed? Corns and calluses may be diagnosed based on your symptoms, your medical history, and a physical exam. How is this treated? Treatment for corns and calluses may include:  Removing the cause of the friction or pressure. This may involve: ? Changing your shoes. ? Wearing shoe inserts (orthotics) or other protective layers in your shoes, such as a corn pad. ? Wearing gloves.  Applying  medicine to the skin (topical medicine) to help soften skin in the hardened, thickened areas.  Removing layers of dead skin with a file to reduce the size of the corn or callus.  Removing the corn or callus with a scalpel or laser.  Taking antibiotic medicines, if your corn or callus is infected.  Having surgery, if a toe deformity is the cause. Follow these instructions at home:   Take over-the-counter and prescription medicines only as told by your health care provider.  If you were prescribed an antibiotic, take it as told by your health care provider. Do not stop taking it even if your condition starts to improve.  Wear shoes that fit well. Avoid wearing high-heeled shoes and shoes that are too tight or too loose.  Wear any padding, protective layers, gloves, or orthotics as told by your health care provider.  Soak your hands or feet and then use a file or pumice stone to soften your corn or callus. Do this as told by your health care provider.  Check your corn or callus every day for symptoms of infection. Contact a health care provider if you:  Notice that your symptoms do not improve with treatment.  Have redness or swelling that gets worse.  Notice that your corn or callus becomes painful.  Have fluid, blood, or pus coming from your corn or callus.  Have new symptoms. Summary  Corns are small areas of thickened skin that occur on the top, sides, or tip of a toe.  Calluses are areas of thickened skin that can occur anywhere on the body, including the hands, fingers, palms, and soles of the feet. Calluses are usually larger than corns.  Corns and calluses are caused by rubbing (friction) or pressure, such as from shoes that are too tight or do not fit properly.  Treatment may include wearing any padding, protective layers, gloves, or orthotics as told by your health care provider. This information is not intended to replace advice given to you by your health care  provider. Make sure you discuss any questions you have with your health care provider. Document Revised: 09/21/2018 Document Reviewed: 04/14/2017 Elsevier Patient Education  2020 [employer].
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
## 2019-12-15 ENCOUNTER — Encounter: Payer: Self-pay | Admitting: Family Medicine

## 2019-12-15 ENCOUNTER — Other Ambulatory Visit: Payer: Self-pay | Admitting: Family Medicine

## 2019-12-15 ENCOUNTER — Ambulatory Visit (INDEPENDENT_AMBULATORY_CARE_PROVIDER_SITE_OTHER): Payer: Medicare Other | Admitting: Family Medicine

## 2019-12-15 ENCOUNTER — Other Ambulatory Visit: Payer: Self-pay

## 2019-12-15 VITALS — BP 126/80 | HR 84 | Temp 97.0°F | Resp 18 | Ht 60.0 in | Wt 257.4 lb

## 2019-12-15 DIAGNOSIS — R1906 Epigastric swelling, mass or lump: Secondary | ICD-10-CM

## 2019-12-15 DIAGNOSIS — D229 Melanocytic nevi, unspecified: Secondary | ICD-10-CM

## 2019-12-15 DIAGNOSIS — E785 Hyperlipidemia, unspecified: Secondary | ICD-10-CM

## 2019-12-15 DIAGNOSIS — I10 Essential (primary) hypertension: Secondary | ICD-10-CM

## 2019-12-15 DIAGNOSIS — Z0000 Encounter for general adult medical examination without abnormal findings: Secondary | ICD-10-CM

## 2019-12-15 DIAGNOSIS — R739 Hyperglycemia, unspecified: Secondary | ICD-10-CM

## 2019-12-15 DIAGNOSIS — E039 Hypothyroidism, unspecified: Secondary | ICD-10-CM

## 2019-12-15 LAB — COMPREHENSIVE METABOLIC PANEL
ALT: 19 U/L (ref 0–35)
AST: 16 U/L (ref 0–37)
Albumin: 4.1 g/dL (ref 3.5–5.2)
Alkaline Phosphatase: 122 U/L — ABNORMAL HIGH (ref 39–117)
BUN: 18 mg/dL (ref 6–23)
CO2: 32 mEq/L (ref 19–32)
Calcium: 9.1 mg/dL (ref 8.4–10.5)
Chloride: 104 mEq/L (ref 96–112)
Creatinine, Ser: 0.58 mg/dL (ref 0.40–1.20)
GFR: 102.55 mL/min (ref >60.00)
Glucose, Bld: 124 mg/dL — ABNORMAL HIGH (ref 70–99)
Potassium: 4.5 mEq/L (ref 3.5–5.1)
Sodium: 143 mEq/L (ref 135–145)
Total Bilirubin: 1 mg/dL (ref 0.2–1.2)
Total Protein: 6.7 g/dL (ref 6.0–8.3)

## 2019-12-15 LAB — LIPID PANEL
Cholesterol: 132 mg/dL (ref 0–200)
HDL: 36.7 mg/dL — ABNORMAL LOW (ref >39.00)
LDL Cholesterol: 71 mg/dL (ref 0–99)
NonHDL: 95.3
Total CHOL/HDL Ratio: 4
Triglycerides: 124 mg/dL (ref 0.0–149.0)
VLDL: 24.8 mg/dL (ref 0.0–40.0)

## 2019-12-15 LAB — TSH: TSH: 3.8 u[IU]/mL (ref 0.35–4.50)

## 2019-12-15 MED ORDER — IBUPROFEN 600 MG PO TABS: 600.0000 mg | ORAL_TABLET | Freq: Four times a day (QID) | ORAL | 1 refills | Status: DC | PRN

## 2019-12-15 NOTE — Patient Instructions (Signed)
Preventive Care 38 Years and Older, Female Preventive care refers to lifestyle choices and visits with your health care provider that can promote health and wellness. This includes:  A yearly physical exam. This is also called an annual well check.  Regular dental and eye exams.  Immunizations.  Screening for certain conditions.  Healthy lifestyle choices, such as diet and exercise. What can I expect for my preventive care visit? Physical exam Your health care provider will check:  Height and weight. These may be used to calculate body mass index (BMI), which is a measurement that tells if you are at a healthy weight.  Heart rate and blood pressure.  Your skin for abnormal spots. Counseling Your health care provider may ask you questions about:  Alcohol, tobacco, and drug use.  Emotional well-being.  Home and relationship well-being.  Sexual activity.  Eating habits.  History of falls.  Memory and ability to understand (cognition).  Work and work Statistician.  Pregnancy and menstrual history. What immunizations do I need?  Influenza (flu) vaccine  This is recommended every year. Tetanus, diphtheria, and pertussis (Tdap) vaccine  You may need a Td booster every 10 years. Varicella (chickenpox) vaccine  You may need this vaccine if you have not already been vaccinated. Zoster (shingles) vaccine  You may need this after age 33. Pneumococcal conjugate (PCV13) vaccine  One dose is recommended after age 71. Pneumococcal polysaccharide (PPSV23) vaccine  One dose is recommended after age 71. Measles, mumps, and rubella (MMR) vaccine  You may need at least one dose of MMR if you were born in 1957 or later. You may also need a second dose. Meningococcal conjugate (MenACWY) vaccine  You may need this if you have certain conditions. Hepatitis A vaccine  You may need this if you have certain conditions or if you travel or work in places where you may be exposed  to hepatitis A. Hepatitis B vaccine  You may need this if you have certain conditions or if you travel or work in places where you may be exposed to hepatitis B. Haemophilus influenzae type b (Hib) vaccine  You may need this if you have certain conditions. You may receive vaccines as individual doses or as more than one vaccine together in one shot (combination vaccines). Talk with your health care provider about the risks and benefits of combination vaccines. What tests do I need? Blood tests  Lipid and cholesterol levels. These may be checked every 5 years, or more frequently depending on your overall health.  Hepatitis C test.  Hepatitis B test. Screening  Lung cancer screening. You may have this screening every year starting at age 71 if you have a 30-pack-year history of smoking and currently smoke or have quit within the past 15 years.  Colorectal cancer screening. All adults should have this screening starting at age 71 and continuing until age 15. Your health care provider may recommend screening at age 71 if you are at increased risk. You will have tests every 1-10 years, depending on your results and the type of screening test.  Diabetes screening. This is done by checking your blood sugar (glucose) after you have not eaten for a while (fasting). You may have this done every 1-3 years.  Mammogram. This may be done every 1-2 years. Talk with your health care provider about how often you should have regular mammograms.  BRCA-related cancer screening. This may be done if you have a family history of breast, ovarian, tubal, or peritoneal cancers.  Other tests  Sexually transmitted disease (STD) testing.  Bone density scan. This is done to screen for osteoporosis. You may have this done starting at age 71. Follow these instructions at home: Eating and drinking  Eat a diet that includes fresh fruits and vegetables, whole grains, lean protein, and low-fat dairy products. Limit  your intake of foods with high amounts of sugar, saturated fats, and salt.  Take vitamin and mineral supplements as recommended by your health care provider.  Do not drink alcohol if your health care provider tells you not to drink.  If you drink alcohol: ? Limit how much you have to 0-1 drink a day. ? Be aware of how much alcohol is in your drink. In the [place] one drink equals one 12 oz bottle of beer (355 mL), one 5 oz glass of wine (148 mL), or one 1 oz glass of hard liquor (44 mL). Lifestyle  Take daily care of your teeth and gums.  Stay active. Exercise for at least 30 minutes on 5 or more days each week.  Do not use any products that contain nicotine or tobacco, such as cigarettes, e-cigarettes, and chewing tobacco. If you need help quitting, ask your health care provider.  If you are sexually active, practice safe sex. Use a condom or other form of protection in order to prevent STIs (sexually transmitted infections).  Talk with your health care provider about taking a low-dose aspirin or statin. What's next?  Go to your health care provider once a year for a well check visit.  Ask your health care provider how often you should have your eyes and teeth checked.  Stay up to date on all vaccines. This information is not intended to replace advice given to you by your health care provider. Make sure you discuss any questions you have with your health care provider. Document Revised: 05/26/2018 Document Reviewed: 05/26/2018 Elsevier Patient Education  2020 [employer].
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
## 2019-12-15 NOTE — Addendum Note (Signed)
Addended byDamita Dunnings D on: 12/15/2019 04:29 PM   Modules accepted: Orders

## 2019-12-22 ENCOUNTER — Ambulatory Visit (HOSPITAL_BASED_OUTPATIENT_CLINIC_OR_DEPARTMENT_OTHER)
Admission: RE | Admit: 2019-12-22 | Discharge: 2019-12-22 | Disposition: A | Discharge status: Home / Self Care | Payer: Medicare Other | Source: Ambulatory Visit | Attending: Family Medicine | Admitting: Family Medicine

## 2019-12-22 ENCOUNTER — Other Ambulatory Visit: Payer: Self-pay

## 2019-12-22 ENCOUNTER — Other Ambulatory Visit: Payer: Self-pay | Admitting: Podiatry

## 2019-12-22 DIAGNOSIS — K7689 Other specified diseases of liver: Secondary | ICD-10-CM | POA: Diagnosis not present

## 2019-12-22 DIAGNOSIS — R1906 Epigastric swelling, mass or lump: Secondary | ICD-10-CM | POA: Diagnosis not present

## 2019-12-22 DIAGNOSIS — D1779 Benign lipomatous neoplasm of other sites: Secondary | ICD-10-CM | POA: Diagnosis not present

## 2019-12-22 DIAGNOSIS — K439 Ventral hernia without obstruction or gangrene: Secondary | ICD-10-CM | POA: Diagnosis not present

## 2019-12-22 DIAGNOSIS — K429 Umbilical hernia without obstruction or gangrene: Secondary | ICD-10-CM | POA: Diagnosis not present

## 2019-12-22 MED ORDER — IOHEXOL 300 MG/ML  SOLN
100.0000 mL | Freq: Once | INTRAMUSCULAR | Status: AC | PRN
  Administered 2019-12-22: 100 mL via INTRAVENOUS

## 2019-12-25 ENCOUNTER — Other Ambulatory Visit: Payer: Self-pay

## 2019-12-25 DIAGNOSIS — K429 Umbilical hernia without obstruction or gangrene: Secondary | ICD-10-CM

## 2019-12-28 ENCOUNTER — Ambulatory Visit
Admission: RE | Admit: 2019-12-28 | Discharge: 2019-12-28 | Disposition: A | Discharge status: Home / Self Care | Payer: Medicare Other | Source: Ambulatory Visit | Attending: Family Medicine | Admitting: Family Medicine

## 2019-12-28 ENCOUNTER — Other Ambulatory Visit: Payer: Self-pay

## 2019-12-28 DIAGNOSIS — Z1231 Encounter for screening mammogram for malignant neoplasm of breast: Secondary | ICD-10-CM

## 2020-01-05 DIAGNOSIS — R609 Edema, unspecified: Secondary | ICD-10-CM | POA: Diagnosis not present

## 2020-01-05 DIAGNOSIS — M810 Age-related osteoporosis without current pathological fracture: Secondary | ICD-10-CM | POA: Diagnosis not present

## 2020-01-05 DIAGNOSIS — D126 Benign neoplasm of colon, unspecified: Secondary | ICD-10-CM | POA: Diagnosis not present

## 2020-01-05 DIAGNOSIS — M1990 Unspecified osteoarthritis, unspecified site: Secondary | ICD-10-CM | POA: Diagnosis not present

## 2020-01-12 ENCOUNTER — Telehealth: Payer: Self-pay | Admitting: Family Medicine
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
## 2020-01-15 NOTE — Telephone Encounter (Signed)
I printed out a new placard and placed in your office. I could not find an original in her chart

## 2020-01-15 NOTE — Telephone Encounter (Signed)
Pt.notified

## 2020-01-15 NOTE — Telephone Encounter (Signed)
done

## 2020-01-19 ENCOUNTER — Other Ambulatory Visit: Payer: Self-pay | Admitting: Podiatry

## 2020-02-02 DIAGNOSIS — E039 Hypothyroidism, unspecified: Secondary | ICD-10-CM | POA: Diagnosis not present

## 2020-02-02 DIAGNOSIS — E559 Vitamin D deficiency, unspecified: Secondary | ICD-10-CM | POA: Diagnosis not present

## 2020-02-02 DIAGNOSIS — D689 Coagulation defect, unspecified: Secondary | ICD-10-CM | POA: Diagnosis not present

## 2020-02-02 DIAGNOSIS — G8192 Hemiplegia, unspecified affecting left dominant side: Secondary | ICD-10-CM | POA: Diagnosis not present

## 2020-02-03 ENCOUNTER — Encounter (HOSPITAL_BASED_OUTPATIENT_CLINIC_OR_DEPARTMENT_OTHER): Payer: Self-pay | Admitting: Emergency Medicine

## 2020-02-03 ENCOUNTER — Other Ambulatory Visit: Payer: Self-pay

## 2020-02-03 DIAGNOSIS — M549 Dorsalgia, unspecified: Secondary | ICD-10-CM | POA: Insufficient documentation

## 2020-02-03 DIAGNOSIS — Z5321 Procedure and treatment not carried out due to patient leaving prior to being seen by health care provider: Secondary | ICD-10-CM | POA: Diagnosis not present

## 2020-02-03 NOTE — ED Triage Notes (Signed)
Mid to right back pain radiating into her neck since Tuesday. Denies injury.

## 2020-02-04 ENCOUNTER — Emergency Department (HOSPITAL_BASED_OUTPATIENT_CLINIC_OR_DEPARTMENT_OTHER)
Admission: EM | Admit: 2020-02-04 | Discharge: 2020-02-04 | Disposition: A | Discharge status: Home / Self Care | Payer: Medicare Other | Attending: Emergency Medicine | Admitting: Emergency Medicine
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
## 2020-02-13 ENCOUNTER — Ambulatory Visit (INDEPENDENT_AMBULATORY_CARE_PROVIDER_SITE_OTHER): Payer: Medicare Other | Admitting: *Deleted

## 2020-02-13 ENCOUNTER — Ambulatory Visit (INDEPENDENT_AMBULATORY_CARE_PROVIDER_SITE_OTHER): Payer: Medicare Other | Admitting: Family Medicine

## 2020-02-13 ENCOUNTER — Other Ambulatory Visit: Payer: Self-pay

## 2020-02-13 ENCOUNTER — Encounter: Payer: Self-pay | Admitting: Family Medicine

## 2020-02-13 ENCOUNTER — Encounter: Payer: Self-pay | Admitting: *Deleted

## 2020-02-13 ENCOUNTER — Ambulatory Visit (HOSPITAL_BASED_OUTPATIENT_CLINIC_OR_DEPARTMENT_OTHER)
Admission: RE | Admit: 2020-02-13 | Discharge: 2020-02-13 | Disposition: A | Discharge status: Home / Self Care | Payer: Medicare Other | Source: Ambulatory Visit | Attending: Family Medicine | Admitting: Family Medicine

## 2020-02-13 VITALS — BP 140/86 | HR 75 | Temp 98.0°F | Resp 18 | Ht 60.0 in | Wt 259.0 lb

## 2020-02-13 VITALS — BP 140/86 | HR 75 | Temp 98.0°F | Ht 60.0 in | Wt 259.0 lb

## 2020-02-13 DIAGNOSIS — M545 Low back pain, unspecified: Secondary | ICD-10-CM

## 2020-02-13 DIAGNOSIS — Z0000 Encounter for general adult medical examination without abnormal findings: Secondary | ICD-10-CM | POA: Diagnosis not present

## 2020-02-13 DIAGNOSIS — R8290 Unspecified abnormal findings in urine: Secondary | ICD-10-CM | POA: Diagnosis not present

## 2020-02-13 LAB — POC URINALSYSI DIPSTICK (AUTOMATED)
Bilirubin, UA: NEGATIVE
Blood, UA: NEGATIVE
Glucose, UA: NEGATIVE
Ketones, UA: NEGATIVE
Leukocytes, UA: NEGATIVE
Nitrite, UA: NEGATIVE
Protein, UA: POSITIVE — AB
Spec Grav, UA: >=1.03 — AB (ref 1.010–1.025)
Urobilinogen, UA: 0.2 E.U./dL
pH, UA: 5.5 (ref 5.0–8.0)

## 2020-02-13 NOTE — Addendum Note (Signed)
Addended by: Sanda Linger on: 02/13/2020 01:35 PM   Modules accepted: Orders
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
## 2020-02-13 NOTE — Patient Instructions (Signed)

## 2020-02-13 NOTE — Patient Instructions (Signed)
Continue to eat heart healthy diet (full of fruits, vegetables, whole grains, lean protein, water--limit salt, fat, and sugar intake) and increase physical activity as tolerated.  Continue doing brain stimulating activities (puzzles, reading, adult coloring books, staying active) to keep memory sharp.    Kelly [name] , Thank you for taking time to come for your Medicare Wellness Visit. I appreciate your ongoing commitment to your health goals. Please review the following plan we discussed and let me know if I can assist you in the future.   These are the goals we discussed: Goals    .  240lb (pt-stated)       This is a list of the screening recommended for you and due dates:  Health Maintenance  Topic Date Due  . Flu Shot  01/14/2020  . Colon Cancer Screening  06/27/2020  . DEXA scan (bone density measurement)  03/01/2021  . Mammogram  12/27/2021  . Tetanus Vaccine  10/30/2023  . COVID-19 Vaccine  Completed  .  Hepatitis C: One time screening is recommended by Center for Disease Control  (CDC) for  adults born from [birth year] through 1967.   Completed  . Pneumonia vaccines  Completed    Preventive Care 35 Years and Older, Female Preventive care refers to lifestyle choices and visits with your health care provider that can promote health and wellness. This includes:  A yearly physical exam. This is also called an annual well check.  Regular dental and eye exams.  Immunizations.  Screening for certain conditions.  Healthy lifestyle choices, such as diet and exercise. What can I expect for my preventive care visit? Physical exam Your health care provider will check:  Height and weight. These may be used to calculate body mass index (BMI), which is a measurement that tells if you are at a healthy weight.  Heart rate and blood pressure.  Your skin for abnormal spots. Counseling Your health care provider may ask you questions about:  Alcohol, tobacco, and drug use.  Emotional  well-being.  Home and relationship well-being.  Sexual activity.  Eating habits.  History of falls.  Memory and ability to understand (cognition).  Work and work Statistician.  Pregnancy and menstrual history. What immunizations do I need?  Influenza (flu) vaccine  This is recommended every year. Tetanus, diphtheria, and pertussis (Tdap) vaccine  You may need a Td booster every 10 years. Varicella (chickenpox) vaccine  You may need this vaccine if you have not already been vaccinated. Zoster (shingles) vaccine  You may need this after age 51. Pneumococcal conjugate (PCV13) vaccine  One dose is recommended after age 26. Pneumococcal polysaccharide (PPSV23) vaccine  One dose is recommended after age 52. Measles, mumps, and rubella (MMR) vaccine  You may need at least one dose of MMR if you were born in 1957 or later. You may also need a second dose. Meningococcal conjugate (MenACWY) vaccine  You may need this if you have certain conditions. Hepatitis A vaccine  You may need this if you have certain conditions or if you travel or work in places where you may be exposed to hepatitis A. Hepatitis B vaccine  You may need this if you have certain conditions or if you travel or work in places where you may be exposed to hepatitis B. Haemophilus influenzae type b (Hib) vaccine  You may need this if you have certain conditions. You may receive vaccines as individual doses or as more than one vaccine together in one shot (combination vaccines). Talk with  your health care provider about the risks and benefits of combination vaccines. What tests do I need? Blood tests  Lipid and cholesterol levels. These may be checked every 5 years, or more frequently depending on your overall health.  Hepatitis C test.  Hepatitis B test. Screening  Lung cancer screening. You may have this screening every year starting at age 17 if you have a 30-pack-year history of smoking and  currently smoke or have quit within the past 15 years.  Colorectal cancer screening. All adults should have this screening starting at age 56 and continuing until age 92. Your health care provider may recommend screening at age 40 if you are at increased risk. You will have tests every 1-10 years, depending on your results and the type of screening test.  Diabetes screening. This is done by checking your blood sugar (glucose) after you have not eaten for a while (fasting). You may have this done every 1-3 years.  Mammogram. This may be done every 1-2 years. Talk with your health care provider about how often you should have regular mammograms.  BRCA-related cancer screening. This may be done if you have a family history of breast, ovarian, tubal, or peritoneal cancers. Other tests  Sexually transmitted disease (STD) testing.  Bone density scan. This is done to screen for osteoporosis. You may have this done starting at age 24. Follow these instructions at home: Eating and drinking  Eat a diet that includes fresh fruits and vegetables, whole grains, lean protein, and low-fat dairy products. Limit your intake of foods with high amounts of sugar, saturated fats, and salt.  Take vitamin and mineral supplements as recommended by your health care provider.  Do not drink alcohol if your health care provider tells you not to drink.  If you drink alcohol: ? Limit how much you have to 0-1 drink a day. ? Be aware of how much alcohol is in your drink. In the [place] one drink equals one 12 oz bottle of beer (355 mL), one 5 oz glass of wine (148 mL), or one 1 oz glass of hard liquor (44 mL). Lifestyle  Take daily care of your teeth and gums.  Stay active. Exercise for at least 30 minutes on 5 or more days each week.  Do not use any products that contain nicotine or tobacco, such as cigarettes, e-cigarettes, and chewing tobacco. If you need help quitting, ask your health care provider.  If you are  sexually active, practice safe sex. Use a condom or other form of protection in order to prevent STIs (sexually transmitted infections).  Talk with your health care provider about taking a low-dose aspirin or statin. What's next?  Go to your health care provider once a year for a well check visit.  Ask your health care provider how often you should have your eyes and teeth checked.  Stay up to date on all vaccines. This information is not intended to replace advice given to you by your health care provider. Make sure you discuss any questions you have with your health care provider. Document Revised: 05/26/2018 Document Reviewed: 05/26/2018 Elsevier Patient Education  2020 [employer].

## 2020-02-14 ENCOUNTER — Other Ambulatory Visit: Payer: Self-pay | Admitting: Family Medicine

## 2020-02-14 DIAGNOSIS — M545 Low back pain, unspecified: Secondary | ICD-10-CM

## 2020-02-14 DIAGNOSIS — M5441 Lumbago with sciatica, right side: Secondary | ICD-10-CM

## 2020-02-14 LAB — URINE CULTURE
MICRO NUMBER:: 10893769
SPECIMEN QUALITY:: ADEQUATE

## 2020-02-14 MED ORDER — TRAMADOL HCL 50 MG PO TABS: 50.0000 mg | ORAL_TABLET | Freq: Three times a day (TID) | ORAL | 0 refills | Status: AC | PRN

## 2020-03-12 ENCOUNTER — Ambulatory Visit: Payer: Medicare Other | Admitting: Podiatry

## 2020-03-19 ENCOUNTER — Other Ambulatory Visit: Payer: Medicare Other

## 2020-03-19 NOTE — Addendum Note (Signed)
Addended by: Kelle Darting A on: 03/19/2020 09:01 AM   Modules accepted: Orders

## 2020-03-20 ENCOUNTER — Ambulatory Visit: Payer: Medicare Other

## 2020-03-20 ENCOUNTER — Other Ambulatory Visit: Payer: Self-pay

## 2020-03-20 ENCOUNTER — Ambulatory Visit (INDEPENDENT_AMBULATORY_CARE_PROVIDER_SITE_OTHER): Payer: Medicare Other

## 2020-03-20 ENCOUNTER — Other Ambulatory Visit: Payer: Medicare Other

## 2020-03-20 DIAGNOSIS — Z23 Encounter for immunization: Secondary | ICD-10-CM

## 2020-03-20 DIAGNOSIS — E785 Hyperlipidemia, unspecified: Secondary | ICD-10-CM | POA: Diagnosis not present

## 2020-03-20 DIAGNOSIS — R739 Hyperglycemia, unspecified: Secondary | ICD-10-CM | POA: Diagnosis not present

## 2020-03-21 LAB — LIPID PANEL
Cholesterol: 129 mg/dL (ref <200)
HDL: 37 mg/dL — ABNORMAL LOW (ref >=50)
LDL Cholesterol (Calc): 73 mg/dL (calc)
Non-HDL Cholesterol (Calc): 92 mg/dL (calc) (ref <130)
Total CHOL/HDL Ratio: 3.5 (calc) (ref <5.0)
Triglycerides: 111 mg/dL (ref <150)

## 2020-03-21 LAB — COMPREHENSIVE METABOLIC PANEL
AG Ratio: 1.5 (calc) (ref 1.0–2.5)
ALT: 14 U/L (ref 6–29)
AST: 13 U/L (ref 10–35)
Albumin: 4.1 g/dL (ref 3.6–5.1)
Alkaline phosphatase (APISO): 118 U/L (ref 37–153)
BUN: 17 mg/dL (ref 7–25)
CO2: 29 mmol/L (ref 20–32)
Calcium: 9.3 mg/dL (ref 8.6–10.4)
Chloride: 106 mmol/L (ref 98–110)
Creat: 0.61 mg/dL (ref 0.60–0.93)
Globulin: 2.7 g/dL (calc) (ref 1.9–3.7)
Glucose, Bld: 131 mg/dL — ABNORMAL HIGH (ref 65–99)
Potassium: 4.9 mmol/L (ref 3.5–5.3)
Sodium: 143 mmol/L (ref 135–146)
Total Bilirubin: 0.9 mg/dL (ref 0.2–1.2)
Total Protein: 6.8 g/dL (ref 6.1–8.1)

## 2020-03-21 LAB — HEMOGLOBIN A1C
Hgb A1c MFr Bld: 5.3 % of total Hgb (ref <5.7)
Mean Plasma Glucose: 105 (calc)
eAG (mmol/L): 5.8 (calc)

## 2020-03-22 ENCOUNTER — Other Ambulatory Visit: Payer: Self-pay

## 2020-03-22 ENCOUNTER — Ambulatory Visit (INDEPENDENT_AMBULATORY_CARE_PROVIDER_SITE_OTHER): Payer: Medicare Other | Admitting: Podiatry

## 2020-03-22 ENCOUNTER — Encounter: Payer: Self-pay | Admitting: Podiatry

## 2020-03-22 DIAGNOSIS — Q828 Other specified congenital malformations of skin: Secondary | ICD-10-CM

## 2020-03-22 DIAGNOSIS — M79671 Pain in right foot: Secondary | ICD-10-CM | POA: Diagnosis not present
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
## 2020-04-03 ENCOUNTER — Ambulatory Visit: Payer: Medicare Other | Admitting: Physician Assistant

## 2020-04-09 DIAGNOSIS — Z961 Presence of intraocular lens: Secondary | ICD-10-CM | POA: Diagnosis not present

## 2020-04-12 ENCOUNTER — Other Ambulatory Visit: Payer: Self-pay | Admitting: Family Medicine

## 2020-04-12 DIAGNOSIS — I10 Essential (primary) hypertension: Secondary | ICD-10-CM

## 2020-04-17 ENCOUNTER — Other Ambulatory Visit: Payer: Self-pay | Admitting: Family Medicine

## 2020-04-17 DIAGNOSIS — I10 Essential (primary) hypertension: Secondary | ICD-10-CM

## 2020-06-18 ENCOUNTER — Encounter: Payer: Self-pay | Admitting: Family Medicine

## 2020-06-18 ENCOUNTER — Other Ambulatory Visit: Payer: Self-pay

## 2020-06-18 ENCOUNTER — Telehealth (INDEPENDENT_AMBULATORY_CARE_PROVIDER_SITE_OTHER): Payer: Medicare Other | Admitting: Family Medicine

## 2020-06-18 VITALS — Ht 60.0 in | Wt 247.0 lb

## 2020-06-18 DIAGNOSIS — R112 Nausea with vomiting, unspecified: Secondary | ICD-10-CM

## 2020-06-18 DIAGNOSIS — R197 Diarrhea, unspecified: Secondary | ICD-10-CM

## 2020-06-18 NOTE — Progress Notes (Signed)
Virtual Visit via Video Note  I connected with Kelly [name] on 06/18/20 at  8:40 AM EST by a video enabled telemedicine application and verified that I am speaking with the correct person using two identifiers.  Location: Patient: home alone  Provider: home    I discussed the limitations of evaluation and management by telemedicine and the availability of in person appointments. The patient expressed understanding and agreed to proceed.  History of Present Illness: Pt is home c/o nausea/ vomiting/ diarrhea x 1 month   She thinks it may be one of her meds but she does not know which one.  she says it is something she takes in the am.  No her pm meds    She takes potassium in am,  Mvi,  pepcid ,  benezapril and furosemide, fish oil, d 3---- all in am.  No fevers   Observations/Objective: There were no vitals filed for this visit. No fevers   Assessment and Plan: 1. Nausea vomiting and diarrhea Hold MVI and fish oil for a few days  Pt states when she held all her meds in am she felt better She will let us know if that helps  May need labs if no improvement    Follow Up Instructions:    I discussed the assessment and treatment plan with the patient. The patient was provided an opportunity to ask questions and all were answered. The patient agreed with the plan and demonstrated an understanding of the instructions.   The patient was advised to call back or seek an in-person evaluation if the symptoms worsen or if the condition fails to improve as anticipated.  I provided 25 minutes of non-face-to-face time during this encounter.   Donato Schultz, DO

## 2020-06-28 ENCOUNTER — Ambulatory Visit: Payer: Medicare Other | Admitting: Podiatry

## 2020-06-28 ENCOUNTER — Other Ambulatory Visit: Payer: Self-pay | Admitting: Family Medicine

## 2020-06-28 DIAGNOSIS — K219 Gastro-esophageal reflux disease without esophagitis: Secondary | ICD-10-CM

## 2020-06-28 DIAGNOSIS — I10 Essential (primary) hypertension: Secondary | ICD-10-CM

## 2020-07-09 ENCOUNTER — Other Ambulatory Visit: Payer: Self-pay

## 2020-07-09 ENCOUNTER — Encounter: Payer: Self-pay | Admitting: Podiatry

## 2020-07-09 ENCOUNTER — Ambulatory Visit (INDEPENDENT_AMBULATORY_CARE_PROVIDER_SITE_OTHER): Payer: Medicare Other | Admitting: Podiatry

## 2020-07-09 DIAGNOSIS — M79671 Pain in right foot: Secondary | ICD-10-CM

## 2020-07-09 DIAGNOSIS — Q828 Other specified congenital malformations of skin: Secondary | ICD-10-CM
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
## 2020-08-06 ENCOUNTER — Other Ambulatory Visit: Payer: Self-pay | Admitting: Family Medicine

## 2020-08-06 DIAGNOSIS — E039 Hypothyroidism, unspecified: Secondary | ICD-10-CM

## 2020-08-22 ENCOUNTER — Ambulatory Visit (HOSPITAL_BASED_OUTPATIENT_CLINIC_OR_DEPARTMENT_OTHER)
Admission: RE | Admit: 2020-08-22 | Discharge: 2020-08-22 | Disposition: A | Discharge status: Home / Self Care | Payer: Medicare Other | Source: Ambulatory Visit | Attending: Family Medicine | Admitting: Family Medicine

## 2020-08-22 ENCOUNTER — Other Ambulatory Visit: Payer: Self-pay

## 2020-08-22 ENCOUNTER — Encounter: Payer: Self-pay | Admitting: Family Medicine

## 2020-08-22 ENCOUNTER — Ambulatory Visit (INDEPENDENT_AMBULATORY_CARE_PROVIDER_SITE_OTHER): Payer: Medicare Other | Admitting: Family Medicine

## 2020-08-22 VITALS — BP 128/80 | HR 76 | Temp 98.5°F | Resp 18 | Ht 60.0 in

## 2020-08-22 DIAGNOSIS — M79641 Pain in right hand: Secondary | ICD-10-CM | POA: Insufficient documentation

## 2020-08-22 NOTE — Patient Instructions (Signed)
Hand Pain Many things can cause hand pain. Some common causes are:  An injury.  Repeating the same movement with your hand over and over (overuse).  Osteoporosis.  Arthritis.  Lumps in the tendons or joints of the hand and wrist (ganglion cysts).  Nerve compression syndromes (carpal tunnel syndrome).  Inflammation of the tendons (tendinitis).  Infection. Follow these instructions at home: Pay attention to any changes in your symptoms. Take these actions to help with your discomfort: Managing pain, stiffness, and swelling  Take over-the-counter and prescription medicines only as told by your health care provider.  Wear a hand splint or support as told by your health care provider.  If directed, put ice on the affected area: ? Put ice in a plastic bag. ? Place a towel between your skin and the bag. ? Leave the ice on for 20 minutes, 2-3 times a day.   Activity  Take breaks from repetitive activity often.  Avoid activities that make your pain worse.  Minimize stress on your hands and wrists as much as possible.  Do stretches or exercises as told by your health care provider.  Do not do activities that make your pain worse. Contact a health care provider if:  Your pain does not get better after a few days of self-care.  Your pain gets worse.  Your pain affects your ability to do your daily activities. Get help right away if:  Your hand becomes warm, red, or swollen.  Your hand is numb or tingling.  Your hand is extremely swollen or deformed.  Your hand or fingers turn white or blue.  You cannot move your hand, wrist, or fingers. Summary  Many things can cause hand pain.  Contact your health care provider if your pain does not get better after a few days of self care.  Minimize stress on your hands and wrists as much as possible.  Do not do activities that make your pain worse. This information is not intended to replace advice given to you by your  health care provider. Make sure you discuss any questions you have with your health care provider. Document Revised: 02/25/2018 Document Reviewed: 02/25/2018 Elsevier Patient Education  2021 Elsevier Inc.  
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
## 2020-08-26 ENCOUNTER — Other Ambulatory Visit: Payer: Self-pay | Admitting: Family Medicine

## 2020-08-26 DIAGNOSIS — M792 Neuralgia and neuritis, unspecified: Secondary | ICD-10-CM

## 2020-08-26 DIAGNOSIS — E785 Hyperlipidemia, unspecified: Secondary | ICD-10-CM

## 2020-08-30 ENCOUNTER — Telehealth: Payer: Self-pay | Admitting: Family Medicine

## 2020-08-30 NOTE — Telephone Encounter (Signed)
Spoke with patient. Pt verbalized understanding. Imaging results sent to [facility].

## 2020-08-30 NOTE — Telephone Encounter (Signed)
Patient would like a return phone call to go over her result to her hand x ray Please advice

## 2020-09-06 DIAGNOSIS — G5601 Carpal tunnel syndrome, right upper limb: Secondary | ICD-10-CM | POA: Insufficient documentation

## 2020-10-03 ENCOUNTER — Encounter: Payer: Self-pay | Admitting: Gastroenterology

## 2020-10-04 DIAGNOSIS — M65331 Trigger finger, right middle finger: Secondary | ICD-10-CM | POA: Diagnosis not present

## 2020-10-04 DIAGNOSIS — G5601 Carpal tunnel syndrome, right upper limb: Secondary | ICD-10-CM | POA: Diagnosis not present

## 2020-10-08 ENCOUNTER — Encounter: Payer: Self-pay | Admitting: Gastroenterology

## 2020-10-22 ENCOUNTER — Ambulatory Visit (INDEPENDENT_AMBULATORY_CARE_PROVIDER_SITE_OTHER): Payer: Medicare Other | Admitting: Podiatry

## 2020-10-22 ENCOUNTER — Other Ambulatory Visit: Payer: Self-pay

## 2020-10-22 ENCOUNTER — Encounter: Payer: Self-pay | Admitting: Podiatry

## 2020-10-22 DIAGNOSIS — M79671 Pain in right foot: Secondary | ICD-10-CM

## 2020-10-22 DIAGNOSIS — Q828 Other specified congenital malformations of skin: Secondary | ICD-10-CM | POA: Diagnosis not present

## 2020-10-27 NOTE — Progress Notes (Signed)
  Subjective:  Patient ID: Kelly [name], female    DOB: Feb 09, 1951,  MRN: [id number]  70 y.o. female presents painful porokeratotic lesion right foot.  Pain prevent comfortable ambulation. Aggravating factor is weightbearing with or without shoegear.  PCP is Dr. Roma Schanz, and last visit was 08/22/2020.  Allergies  Allergen Reactions  . Percocet [Oxycodone-Acetaminophen] Nausea And Vomiting  . Codeine Nausea Only  . Flexeril [Cyclobenzaprine] Other (See Comments)    About made me crazy  . Naproxen Nausea And Vomiting  . Aspirin Other (See Comments)    GI UPSET CAN TAKE LOW DOSE ASPIRIN  . Tape Rash    PAPER TAPE IS OK    Review of Systems: Negative except as noted in the HPI.   Objective:   Constitutional Pt is a pleasant 70 y.o. Caucasian female in NAD. AAO x 3.   Vascular Capillary refill time to digits immediate b/l. Palpable pedal pulses b/l LE. Pedal hair sparse. Lower extremity skin temperature gradient within normal limits. No pain with calf compression b/l. Trace edema noted b/l lower extremities.  Neurologic Protective sensation intact 5/5 intact bilaterally with 10g monofilament b/l. Vibratory sensation intact b/l.  Dermatologic Pedal skin with normal turgor, texture and tone bilaterally. No open wounds bilaterally. No interdigital macerations bilaterally. Toenails 1-5 b/l well maintained with adequate length. No erythema, no edema, no drainage, no fluctuance. Porokeratotic lesion(s) submet head 1 right foot. No erythema, no edema, no drainage, no fluctuance.  Orthopedic: Normal muscle strength 5/5 to all lower extremity muscle groups bilaterally. No pain crepitus or joint limitation noted with ROM b/l. No gross bony deformities bilaterally.   Radiographs: None Assessment:   1. Porokeratosis   2. Right foot pain    Plan:  Patient was evaluated and treated and all questions answered.  Onychomycosis with pain -Nails palliatively debridement as  below. -Educated on self-care  Procedure: Nail Debridement Rationale: Pain Type of Debridement: manual, sharp debridement. Instrumentation: Nail nipper, rotary burr. Number of Nails: 10  -Examined patient. -No new findings. No new orders. -Patient to continue soft, supportive shoe gear daily. -Painful porokeratotic lesion(s) submet head 1 right foot pared and enucleated with sterile scalpel blade without incident. -Patient to report any pedal injuries to medical professional immediately. -Patient/POA to call should there be question/concern in the interim.  Return in about 3 months (around 01/22/2021).  Marzetta Board, DPM

## 2020-11-04 ENCOUNTER — Other Ambulatory Visit: Payer: Self-pay

## 2020-11-04 ENCOUNTER — Ambulatory Visit (INDEPENDENT_AMBULATORY_CARE_PROVIDER_SITE_OTHER): Payer: Medicare Other | Admitting: Podiatry

## 2020-11-04 DIAGNOSIS — M778 Other enthesopathies, not elsewhere classified: Secondary | ICD-10-CM

## 2020-11-04 DIAGNOSIS — M7752 Other enthesopathy of left foot: Secondary | ICD-10-CM

## 2020-11-04 MED ORDER — BETAMETHASONE SOD PHOS & ACET 6 (3-3) MG/ML IJ SUSP
3.0000 mg | Freq: Once | INTRAMUSCULAR | Status: AC
Start: 2020-11-04 — End: 2020-11-04
  Administered 2020-11-04: 3 mg via INTRA_ARTICULAR
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
## 2020-11-18 ENCOUNTER — Other Ambulatory Visit: Payer: Self-pay | Admitting: Family Medicine

## 2020-11-18 DIAGNOSIS — Z1231 Encounter for screening mammogram for malignant neoplasm of breast: Secondary | ICD-10-CM

## 2020-11-27 ENCOUNTER — Telehealth: Payer: Self-pay

## 2020-11-27 NOTE — Telephone Encounter (Signed)
Pt called wanting to know if Dr. Etter Sjogren recommends her getting the 2nd Covid booster or not.  She stated she is scheduled to see her next month, but if Dr. Etter Sjogren thinks she should get it, she wants to be proactive and do so sooner rather than later to prevent sickness.

## 2020-11-29 NOTE — Telephone Encounter (Signed)
Pt aware and voices understanding.   

## 2020-12-13 ENCOUNTER — Telehealth: Payer: Self-pay

## 2020-12-13 NOTE — Telephone Encounter (Signed)
I was working on this chart in pv today, her colonoscopy is a screening on 01/08/21.  Her BMI has consistently been over 50 for the last couple years at least, she is in a wheelchair so we would not be able to get an accurate weight.  She also has numerous preexisting health problems .  Should this pt be scheduled at [facility]?   Thank you, Di Kindle

## 2020-12-17 ENCOUNTER — Encounter: Payer: Self-pay | Admitting: Family Medicine

## 2020-12-17 ENCOUNTER — Ambulatory Visit: Payer: Medicare Other | Attending: Internal Medicine

## 2020-12-17 ENCOUNTER — Ambulatory Visit (INDEPENDENT_AMBULATORY_CARE_PROVIDER_SITE_OTHER): Payer: Medicare Other | Admitting: Family Medicine

## 2020-12-17 ENCOUNTER — Other Ambulatory Visit: Payer: Self-pay

## 2020-12-17 VITALS — BP 120/74 | HR 84 | Temp 98.7°F | Resp 18 | Ht 60.0 in | Wt 243.4 lb

## 2020-12-17 DIAGNOSIS — K429 Umbilical hernia without obstruction or gangrene: Secondary | ICD-10-CM | POA: Diagnosis not present

## 2020-12-17 DIAGNOSIS — E785 Hyperlipidemia, unspecified: Secondary | ICD-10-CM

## 2020-12-17 DIAGNOSIS — I10 Essential (primary) hypertension: Secondary | ICD-10-CM | POA: Diagnosis not present

## 2020-12-17 DIAGNOSIS — Z23 Encounter for immunization: Secondary | ICD-10-CM

## 2020-12-17 DIAGNOSIS — E039 Hypothyroidism, unspecified: Secondary | ICD-10-CM | POA: Diagnosis not present

## 2020-12-17 DIAGNOSIS — Z0000 Encounter for general adult medical examination without abnormal findings: Secondary | ICD-10-CM

## 2020-12-17 MED ORDER — IBUPROFEN 600 MG PO TABS: 600.0000 mg | ORAL_TABLET | Freq: Four times a day (QID) | ORAL | 1 refills | Status: DC | PRN

## 2020-12-17 NOTE — Progress Notes (Signed)
Subjective:     Kelly [name] is a 70 y.o. female and is here for a comprehensive physical exam. The patient reports problems - she is having a dental procedure soon    .  She needs a height and weight recorded for anaesthesia.    She is also f/u on bp, chol and thyroid  She also states she never got a phone call about her surgery referral ----- she feels like the hernia is getting bigger and is starting to bother her more   Social History   Socioeconomic History   Marital status: Single    Spouse name: Not on file   Number of children: 1   Years of education: Not on file   Highest education level: Not on file  Occupational History   Occupation: DISABLED    Employer: DISABLED  Tobacco Use   Smoking status: Former    Years: 6.00    Pack years: 0.00    Types: Cigarettes    Quit date: 04/15/1977    Years since quitting: 43.7   Smokeless tobacco: Never  Vaping Use   Vaping Use: Never used  Substance and Sexual Activity   Alcohol use: No    Alcohol/week: 0.0 standard drinks   Drug use: No   Sexual activity: Not Currently    Partners: Male    Birth control/protection: Post-menopausal  Other Topics Concern   Not on file  Social History Narrative   Not on file   Social Determinants of Health   Financial Resource Strain: Low Risk    Difficulty of Paying Living Expenses: Not hard at all  Food Insecurity: No Food Insecurity   Worried About Charity fundraiser in the Last Year: Never true   [place] in the Last Year: Never true  Transportation Needs: No Transportation Needs   Lack of Transportation (Medical): No   Lack of Transportation (Non-Medical): No  Physical Activity: Not on file  Stress: Not on file  Social Connections: Not on file  Intimate Partner Violence: Not on file   Health Maintenance  Topic Date Due   COLONOSCOPY (Pts 45-99yrs Insurance coverage will need to be confirmed)  06/27/2020   COVID-19 Vaccine (4 - Booster for Moderna series) 10/11/2020    INFLUENZA VACCINE  01/13/2021   DEXA SCAN  03/01/2021   MAMMOGRAM  12/27/2021   TETANUS/TDAP  10/30/2023   Hepatitis C Screening  Completed   PNA vac Low Risk Adult  Completed   Zoster Vaccines- Shingrix  Completed   HPV VACCINES  Aged Out    The following portions of the patient's history were reviewed and updated as appropriate: allergies, current medications, past family history, past medical history, past social history, past surgical history, and problem list.  Review of Systems Review of Systems  Constitutional: Negative for activity change, appetite change and fatigue.  HENT: Negative for hearing loss, congestion, tinnitus and ear discharge.  dentist q82m Eyes: Negative for visual disturbance (see optho q1y -- vision corrected to 20/20 with glasses).  Respiratory: Negative for cough, chest tightness and shortness of breath.   Cardiovascular: Negative for chest pain, palpitations and leg swelling.  Gastrointestinal: Negative for abdominal pain, diarrhea, constipation and abdominal distention.  Genitourinary: Negative for urgency, frequency, decreased urine volume and difficulty urinating.  Musculoskeletal: Negative for back pain, arthralgias and gait problem.  Skin: Negative for color change, pallor and rash.  Neurological: Negative for dizziness, light-headedness, numbness and headaches.  Hematological: Negative for adenopathy. Does not bruise/bleed  easily.  Psychiatric/Behavioral: Negative for suicidal ideas, confusion, sleep disturbance, self-injury, dysphoric mood, decreased concentration and agitation.      Objective:    BP 120/74 (BP Location: Right Arm, Patient Position: Sitting, Cuff Size: Large)   Pulse 84   Temp 98.7 F (37.1 C) (Oral)   Resp 18   Ht 5' (1.524 m)   Wt 243 lb 6.4 oz (110.4 kg) Comment: Wheelchair  SpO2 95%   BMI 47.54 kg/m  General appearance: alert, cooperative, appears stated age, and no distress Head: Normocephalic, without obvious  abnormality, atraumatic Eyes: conjunctivae/corneas clear. PERRL, EOM's intact. Fundi benign. Ears: normal TM's and external ear canals both ears Neck: no adenopathy, no carotid bruit, no JVD, supple, symmetrical, trachea midline, and thyroid not enlarged, symmetric, no tenderness/mass/nodules Lungs: clear to auscultation bilaterally Heart: regular rate and rhythm, S1, S2 normal, no murmur, click, rub or gallop Abdomen: soft, non-tender; bowel sounds normal; no masses,  no organomegaly--_+ umbilical hernia Extremities: extremities normal, atraumatic, no cyanosis or edema Pulses: 2+ and symmetric Skin: Skin color, texture, turgor normal. No rashes or lesions Lymph nodes: Cervical, supraclavicular, and axillary nodes normal. Neurologic: Alert and oriented X 3, normal strength and tone. Normal symmetric reflexes. Normal coordination and gait    Assessment:    Healthy female exam.    Plan:    Ghm utd Check labs  See After Visit Summary for Counseling Recommendations   1. Hypothyroidism, unspecified type Check labs  Con't med - TSH  2. Hyperlipidemia, unspecified hyperlipidemia type Encourage heart healthy diet such as MIND or DASH diet, increase exercise, avoid trans fats, simple carbohydrates and processed foods, consider a krill or fish or flaxseed oil cap daily.   - Lipid panel - TSH - Comprehensive metabolic panel  3. Primary hypertension Well controlled, no changes to meds. Encouraged heart healthy diet such as the DASH diet and exercise as tolerated.   - Lipid panel - TSH - Comprehensive metabolic panel  4. Preventative health care See above  5. Periumbilical hernia Pt never got surgery app-- referral placed again - Ambulatory referral to General Surgery

## 2020-12-17 NOTE — Progress Notes (Signed)
   Covid-19 Vaccination Clinic  Name:  Kelly [name]    MRN: [id number] DOB: Dec 27, 1950  12/17/2020  Kelly [name] was observed post Covid-19 immunization for 15 minutes without incident. She was provided with Vaccine Information Sheet and instruction to access the V-Safe system.   Kelly [name] was instructed to call 911 with any severe reactions post vaccine: Difficulty breathing  Swelling of face and throat  A fast heartbeat  A bad rash all over body  Dizziness and weakness   Immunizations Administered     Name Date Dose VIS Date Route   Moderna Covid-19 Booster Vaccine 12/17/2020  2:23 PM 0.25 mL 04/03/2020 Intramuscular   Manufacturer: Moderna   Lot: [id number]   [place]: [id number]      b

## 2020-12-17 NOTE — Patient Instructions (Signed)
Preventive Care 72 Years and Older, Female Preventive care refers to lifestyle choices and visits with your health care provider that can promote health and wellness. This includes: A yearly physical exam. This is also called an annual wellness visit. Regular dental and eye exams. Immunizations. Screening for certain conditions. Healthy lifestyle choices, such as: Eating a healthy diet. Getting regular exercise. Not using drugs or products that contain nicotine and tobacco. Limiting alcohol use. What can I expect for my preventive care visit? Physical exam Your health care provider will check your: Height and weight. These may be used to calculate your BMI (body mass index). BMI is a measurement that tells if you are at a healthy weight. Heart rate and blood pressure. Body temperature. Skin for abnormal spots. Counseling Your health care provider may ask you questions about your: Past medical problems. Family's medical history. Alcohol, tobacco, and drug use. Emotional well-being. Home life and relationship well-being. Sexual activity. Diet, exercise, and sleep habits. History of falls. Memory and ability to understand (cognition). Work and work Statistician. Pregnancy and menstrual history. Access to firearms. What immunizations do I need?  Vaccines are usually given at various ages, according to a schedule. Your health care provider will recommend vaccines for you based on your age, medicalhistory, and lifestyle or other factors, such as travel or where you work. What tests do I need? Blood tests Lipid and cholesterol levels. These may be checked every 5 years, or more often depending on your overall health. Hepatitis C test. Hepatitis B test. Screening Lung cancer screening. You may have this screening every year starting at age 72 if you have a 30-pack-year history of smoking and currently smoke or have quit within the past 15 years. Colorectal cancer screening. All  adults should have this screening starting at age 72 and continuing until age 65. Your health care provider may recommend screening at age 72 if you are at increased risk. You will have tests every 1-10 years, depending on your results and the type of screening test. Diabetes screening. This is done by checking your blood sugar (glucose) after you have not eaten for a while (fasting). You may have this done every 1-3 years. Mammogram. This may be done every 1-2 years. Talk with your health care provider about how often you should have regular mammograms. Abdominal aortic aneurysm (AAA) screening. You may need this if you are a current or former smoker. BRCA-related cancer screening. This may be done if you have a family history of breast, ovarian, tubal, or peritoneal cancers. Other tests STD (sexually transmitted disease) testing, if you are at risk. Bone density scan. This is done to screen for osteoporosis. You may have this done starting at age 72. Talk with your health care provider about your test results, treatment options,and if necessary, the need for more tests. Follow these instructions at home: Eating and drinking  Eat a diet that includes fresh fruits and vegetables, whole grains, lean protein, and low-fat dairy products. Limit your intake of foods with high amounts of sugar, saturated fats, and salt. Take vitamin and mineral supplements as recommended by your health care provider. Do not drink alcohol if your health care provider tells you not to drink. If you drink alcohol: Limit how much you have to 0-1 drink a day. Be aware of how much alcohol is in your drink. In the [place] one drink equals one 12 oz bottle of beer (355 mL), one 5 oz glass of wine (148 mL), or one 1  oz glass of hard liquor (44 mL).  Lifestyle Take daily care of your teeth and gums. Brush your teeth every morning and night with fluoride toothpaste. Floss one time each day. Stay active. Exercise for at  least 30 minutes 5 or more days each week. Do not use any products that contain nicotine or tobacco, such as cigarettes, e-cigarettes, and chewing tobacco. If you need help quitting, ask your health care provider. Do not use drugs. If you are sexually active, practice safe sex. Use a condom or other form of protection in order to prevent STIs (sexually transmitted infections). Talk with your health care provider about taking a low-dose aspirin or statin. Find healthy ways to cope with stress, such as: Meditation, yoga, or listening to music. Journaling. Talking to a trusted person. Spending time with friends and family. Safety Always wear your seat belt while driving or riding in a vehicle. Do not drive: If you have been drinking alcohol. Do not ride with someone who has been drinking. When you are tired or distracted. While texting. Wear a helmet and other protective equipment during sports activities. If you have firearms in your house, make sure you follow all gun safety procedures. What's next? Visit your health care provider once a year for an annual wellness visit. Ask your health care provider how often you should have your eyes and teeth checked. Stay up to date on all vaccines. This information is not intended to replace advice given to you by your health care provider. Make sure you discuss any questions you have with your healthcare provider. Document Revised: 05/22/2020 Document Reviewed: 05/26/2018 Elsevier Patient Education  2022 [employer].

## 2020-12-17 NOTE — Telephone Encounter (Signed)
Ok thanks, I will check with the pt and then forward as needed.

## 2020-12-17 NOTE — Telephone Encounter (Signed)
Pt was weighed and measured at Dr Carollee Herter today, her BMI is 47.5, (she has been trying to lose weight) and she states she is able to walk with assistance and she wants to stay at the [facility].  Pt's appointments were reviewed with her and she repeated back the dates and times of her previsit and her colonoscopy.

## 2020-12-18 LAB — LIPID PANEL
Cholesterol: 133 mg/dL (ref 0–200)
HDL: 36.3 mg/dL — ABNORMAL LOW (ref >39.00)
LDL Cholesterol: 67 mg/dL (ref 0–99)
NonHDL: 96.86
Total CHOL/HDL Ratio: 4
Triglycerides: 147 mg/dL (ref 0.0–149.0)
VLDL: 29.4 mg/dL (ref 0.0–40.0)

## 2020-12-18 LAB — COMPREHENSIVE METABOLIC PANEL
ALT: 12 U/L (ref 0–35)
AST: 12 U/L (ref 0–37)
Albumin: 4.1 g/dL (ref 3.5–5.2)
Alkaline Phosphatase: 125 U/L — ABNORMAL HIGH (ref 39–117)
BUN: 13 mg/dL (ref 6–23)
CO2: 28 mEq/L (ref 19–32)
Calcium: 9.4 mg/dL (ref 8.4–10.5)
Chloride: 104 mEq/L (ref 96–112)
Creatinine, Ser: 0.53 mg/dL (ref 0.40–1.20)
GFR: 92.81 mL/min (ref >60.00)
Glucose, Bld: 96 mg/dL (ref 70–99)
Potassium: 4.2 mEq/L (ref 3.5–5.1)
Sodium: 143 mEq/L (ref 135–145)
Total Bilirubin: 1 mg/dL (ref 0.2–1.2)
Total Protein: 7 g/dL (ref 6.0–8.3)

## 2020-12-18 LAB — TSH: TSH: 3.69 u[IU]/mL (ref 0.35–5.50)

## 2020-12-19 ENCOUNTER — Ambulatory Visit (AMBULATORY_SURGERY_CENTER): Payer: Medicare Other

## 2020-12-19 ENCOUNTER — Other Ambulatory Visit: Payer: Self-pay

## 2020-12-19 VITALS — Ht 60.0 in | Wt 243.0 lb

## 2020-12-19 DIAGNOSIS — Z1211 Encounter for screening for malignant neoplasm of colon: Secondary | ICD-10-CM

## 2020-12-19 NOTE — Progress Notes (Signed)
Pre visit completed via phone call; patient verified name, DOB, and address;  No egg or soy allergy known to patient  No issues with past sedation with any surgeries or procedures Patient denies ever being told they had issues or difficulty with intubation  No FH of Malignant Hyperthermia No diet pills per patient No home 02 use per patient  No blood thinners per patient  Pt reports some issues with constipation - takes stool softeners as needed off/on-advised to continue as she normally does No A fib or A flutter   COVID 19 guidelines implemented in PV today with Pt and RN  Pt is fully vaccinated for Covid x 2 + booster;  NO PA's for preps discussed with pt in PV today  Discussed with pt there will be an out-of-pocket cost for prep and that varies from $0 to 70 dollars   Due to the COVID-19 pandemic we are asking patients to follow certain guidelines.  Pt aware of COVID protocols and [facility] guidelines

## 2020-12-20 ENCOUNTER — Other Ambulatory Visit (HOSPITAL_BASED_OUTPATIENT_CLINIC_OR_DEPARTMENT_OTHER): Payer: Self-pay

## 2020-12-20 MED ORDER — COVID-19 MRNA VACC (MODERNA) 100 MCG/0.5ML IM SUSP
INTRAMUSCULAR | 0 refills | Status: DC
  Filled 2020-12-20: qty 0.25, 1d supply, fill #0

## 2020-12-25 ENCOUNTER — Other Ambulatory Visit: Payer: Self-pay | Admitting: Family Medicine

## 2020-12-25 DIAGNOSIS — I10 Essential (primary) hypertension: Secondary | ICD-10-CM

## 2020-12-31 ENCOUNTER — Telehealth: Payer: Self-pay | Admitting: Gastroenterology

## 2020-12-31 NOTE — Telephone Encounter (Signed)
Cant find 32 ounces of Gatorade- instructed to purchase 5- 20 oz bottles for a total amount needed ( 96 oz ) and measure out with a measuring cup 32 oz and then 64 ounces- verbalized understanding

## 2020-12-31 NOTE — Telephone Encounter (Signed)
Inbound call from pt requesting a call back stating that she has questions regarding her prep. Please advise. Thanks.

## 2021-01-03 ENCOUNTER — Ambulatory Visit (AMBULATORY_SURGERY_CENTER): Payer: Medicare Other | Admitting: Gastroenterology

## 2021-01-03 ENCOUNTER — Other Ambulatory Visit: Payer: Self-pay

## 2021-01-03 ENCOUNTER — Encounter: Payer: Self-pay | Admitting: Gastroenterology

## 2021-01-03 VITALS — BP 147/76 | HR 79 | Temp 96.4°F | Resp 14 | Ht 60.0 in | Wt 243.0 lb

## 2021-01-03 DIAGNOSIS — Z86010 Personal history of colonic polyps: Secondary | ICD-10-CM | POA: Diagnosis not present

## 2021-01-03 DIAGNOSIS — Z800 Family history of malignant neoplasm of digestive organs: Secondary | ICD-10-CM | POA: Diagnosis not present

## 2021-01-03 DIAGNOSIS — K621 Rectal polyp: Secondary | ICD-10-CM

## 2021-01-03 DIAGNOSIS — Z1211 Encounter for screening for malignant neoplasm of colon: Secondary | ICD-10-CM | POA: Diagnosis not present

## 2021-01-03 DIAGNOSIS — D128 Benign neoplasm of rectum: Secondary | ICD-10-CM

## 2021-01-03 MED ORDER — SODIUM CHLORIDE 0.9 % IV SOLN: 500.0000 mL | Freq: Once | INTRAVENOUS | Status: DC

## 2021-01-03 NOTE — Progress Notes (Signed)
Called to room to assist during endoscopic procedure.  Patient ID and intended procedure confirmed with present staff. Received instructions for my participation in the procedure from the performing physician.  

## 2021-01-03 NOTE — Op Note (Signed)
[facility] Patient Name: Kelly [name] Procedure Date: 01/03/2021 9:06 AM MRN: [id number] Endoscopist: Thornton Park MD, MD Age: 70 Referring MD:  Date of Birth: [birth year] Gender: Female Account #: [id number] Procedure:                Colonoscopy Indications:              Screening for colorectal malignant neoplasm                           Maternal grandfather with colon cancer                           Hyperplastic polyp on colonoscopy with Dr. Olevia Perches                            2003 and again 2012                           No baseline GI symptoms Medicines:                Monitored Anesthesia Care Procedure:                Pre-Anesthesia Assessment:                           - Prior to the procedure, a History and Physical                            was performed, and patient medications and                            allergies were reviewed. The patient's tolerance of                            previous anesthesia was also reviewed. The risks                            and benefits of the procedure and the sedation                            options and risks were discussed with the patient.                            All questions were answered, and informed consent                            was obtained. Prior Anticoagulants: The patient has                            taken no previous anticoagulant or antiplatelet                            agents. ASA Grade Assessment: III - A patient with  severe systemic disease. After reviewing the risks                            and benefits, the patient was deemed in                            satisfactory condition to undergo the procedure.                           After obtaining informed consent, the colonoscope                            was passed under direct vision. Throughout the                            procedure, the patient's blood pressure, pulse, and                             oxygen saturations were monitored continuously. The                            PCF-HQ190L Colonoscope was introduced through the                            anus and advanced to the 3 cm into the ileum. A                            second forward view of the right colon was                            performed. The colonoscopy was performed without                            difficulty. The patient tolerated the procedure                            well. The quality of the bowel preparation was                            good. The ileocecal valve, appendiceal orifice, and                            rectum were photographed. Scope In: 9:48:38 AM Scope Out: 10:08:37 AM Scope Withdrawal Time: 0 hours 17 minutes 0 seconds  Total Procedure Duration: 0 hours 19 minutes 59 seconds  Findings:                 The perianal and digital rectal examinations were                            normal.                           Many small and large-mouthed diverticula were found  in the sigmoid colon and descending colon.                           Two sessile polyps were found in the rectum. The                            polyps were 2 mm in size. These polyps were removed                            with a cold snare. Resection and retrieval were                            complete. Estimated blood loss was minimal.                           The exam was otherwise without abnormality on                            direct and retroflexion views. Complications:            No immediate complications. Estimated blood loss:                            Minimal. Estimated Blood Loss:     Estimated blood loss was minimal. Impression:               - Diverticulosis in the sigmoid colon and in the                            descending colon.                           - Two 2 mm polyps in the rectum, removed with a                            cold snare. Resected and retrieved.                            - The examination was otherwise normal on direct                            and retroflexion views. Recommendation:           - Patient has a contact number available for                            emergencies. The signs and symptoms of potential                            delayed complications were discussed with the                            patient. Return to normal activities tomorrow.  Written discharge instructions were provided to the                            patient.                           - Follow a high fiber diet. Drink at least 64                            ounces of water daily. Add a daily stool bulking                            agent such as psyllium (an exampled would be                            Metamucil).                           - Continue present medications.                           - Await pathology results.                           - Repeat colonoscopy is not recommended due to                            current age.                           - Emerging evidence supports eating a diet of                            fruits, vegetables, grains, calcium, and yogurt                            while reducing red meat and alcohol may reduce the                            risk of colon cancer.                           - Thank you for allowing me to be involved in your                            colon cancer prevention. Thornton Park MD, MD 01/03/2021 10:12:19 AM This report has been signed electronically.

## 2021-01-03 NOTE — Progress Notes (Signed)
Pt's states no medical or surgical changes since previsit or office visit.   Check-in-yesi  V/s-cw

## 2021-01-03 NOTE — Patient Instructions (Signed)
Handouts given for polyps, diverticulosis and high fiber diet.  Await pathology results.  You don't need another screening colonoscopy due to your age.  YOU HAD AN ENDOSCOPIC PROCEDURE TODAY AT [facility]:   Refer to the procedure report that was given to you for any specific questions about what was found during the examination.  If the procedure report does not answer your questions, please call your gastroenterologist to clarify.  If you requested that your care partner not be given the details of your procedure findings, then the procedure report has been included in a sealed envelope for you to review at your convenience later.  YOU SHOULD EXPECT: Some feelings of bloating in the abdomen. Passage of more gas than usual.  Walking can help get rid of the air that was put into your GI tract during the procedure and reduce the bloating. If you had a lower endoscopy (such as a colonoscopy or flexible sigmoidoscopy) you may notice spotting of blood in your stool or on the toilet paper. If you underwent a bowel prep for your procedure, you may not have a normal bowel movement for a few days.  Please Note:  You might notice some irritation and congestion in your nose or some drainage.  This is from the oxygen used during your procedure.  There is no need for concern and it should clear up in a day or so.  SYMPTOMS TO REPORT IMMEDIATELY:  Following lower endoscopy (colonoscopy or flexible sigmoidoscopy):  Excessive amounts of blood in the stool  Significant tenderness or worsening of abdominal pains  Swelling of the abdomen that is new, acute  Fever of 100F or higher  For urgent or emergent issues, a gastroenterologist can be reached at any hour by calling [phone]. Do not use MyChart messaging for urgent concerns.    DIET:  We do recommend a small meal at first, but then you may proceed to your regular diet.  Drink plenty of fluids but you should avoid alcoholic  beverages for 24 hours.  ACTIVITY:  You should plan to take it easy for the rest of today and you should NOT DRIVE or use heavy machinery until tomorrow (because of the sedation medicines used during the test).    FOLLOW UP: Our staff will call the number listed on your records 48-72 hours following your procedure to check on you and address any questions or concerns that you may have regarding the information given to you following your procedure. If we do not reach you, we will leave a message.  We will attempt to reach you two times.  During this call, we will ask if you have developed any symptoms of COVID 19. If you develop any symptoms (ie: fever, flu-like symptoms, shortness of breath, cough etc.) before then, please call [phone].  If you test positive for Covid 19 in the 2 weeks post procedure, please call and report this information to [place].    If any biopsies were taken you will be contacted by phone or by letter within the next 1-3 weeks.  Please call us at [phone] if you have not heard about the biopsies in 3 weeks.    SIGNATURES/CONFIDENTIALITY: You and/or your care partner have signed paperwork which will be entered into your electronic medical record.  These signatures attest to the fact that that the information above on your After Visit Summary has been reviewed and is understood.  Full responsibility of the confidentiality of this discharge information lies  with you and/or your care-partner.  

## 2021-01-03 NOTE — Progress Notes (Signed)
PT taken to PACU. Monitors in place. VSS. Report given to RN. 

## 2021-01-07 ENCOUNTER — Telehealth: Payer: Self-pay

## 2021-01-07 NOTE — Telephone Encounter (Signed)
  Follow up Call-  Call back number 01/03/2021  Post procedure Call Back phone  # [phone]  Permission to leave phone message Yes  Some recent data might be hidden     Patient questions:  Do you have a fever, pain , or abdominal swelling? No. Pain Score  0 *  Have you tolerated food without any problems? Yes.    Have you been able to return to your normal activities? Yes.    Do you have any questions about your discharge instructions: Diet   No. Medications  No. Follow up visit  No.  Do you have questions or concerns about your Care? No.  Actions: * If pain score is 4 or above: No action needed, pain <4.

## 2021-01-08 ENCOUNTER — Encounter: Payer: Medicare Other | Admitting: Gastroenterology

## 2021-01-09 ENCOUNTER — Encounter: Payer: Self-pay | Admitting: Gastroenterology

## 2021-01-10 ENCOUNTER — Other Ambulatory Visit: Payer: Self-pay

## 2021-01-10 ENCOUNTER — Ambulatory Visit
Admission: RE | Admit: 2021-01-10 | Discharge: 2021-01-10 | Disposition: A | Discharge status: Home / Self Care | Payer: Medicare Other | Source: Ambulatory Visit | Attending: Family Medicine | Admitting: Family Medicine

## 2021-01-10 DIAGNOSIS — Z1231 Encounter for screening mammogram for malignant neoplasm of breast: Secondary | ICD-10-CM | POA: Diagnosis not present

## 2021-01-21 ENCOUNTER — Encounter: Payer: Medicare Other | Admitting: Gastroenterology

## 2021-01-28 ENCOUNTER — Ambulatory Visit (INDEPENDENT_AMBULATORY_CARE_PROVIDER_SITE_OTHER): Payer: Medicare Other | Admitting: Podiatry

## 2021-01-28 ENCOUNTER — Other Ambulatory Visit: Payer: Self-pay

## 2021-01-28 ENCOUNTER — Encounter: Payer: Self-pay | Admitting: Podiatry

## 2021-01-28 DIAGNOSIS — Q828 Other specified congenital malformations of skin: Secondary | ICD-10-CM

## 2021-01-28 DIAGNOSIS — G629 Polyneuropathy, unspecified: Secondary | ICD-10-CM | POA: Diagnosis not present

## 2021-01-31 ENCOUNTER — Other Ambulatory Visit: Payer: Self-pay | Admitting: Family Medicine

## 2021-01-31 DIAGNOSIS — E039 Hypothyroidism, unspecified: Secondary | ICD-10-CM

## 2021-02-01 NOTE — Progress Notes (Signed)
Subjective: Kelly [name] is a pleasant 70 y.o. female patient seen today with h/o neuropathy and hemiplegia.   Allergies  Allergen Reactions   Percocet [Oxycodone-Acetaminophen] Nausea And Vomiting   Codeine Nausea Only   Flexeril [Cyclobenzaprine] Other (See Comments)    About made me crazy   Naproxen Nausea And Vomiting   Aspirin Other (See Comments)    GI UPSET CAN TAKE LOW DOSE ASPIRIN   Tape Rash    PAPER TAPE IS OK    Objective: Physical Exam  General: Kelly [name] is a pleasant 70 y.o. Caucasian female, morbidly obese in NAD. AAO x 3.   Vascular:  Capillary refill time to digits immediate b/l. Palpable DP pulse(s) b/l lower extremities Palpable PT pulse(s) b/l lower extremities Pedal hair sparse. Lower extremity skin temperature gradient within normal limits. No pain with calf compression b/l. Trace edema noted b/l lower extremities.  Dermatological:  Skin warm and supple b/l lower extremities. No open wounds b/l lower extremities. No interdigital macerations b/l lower extremities. Toenails recently debrided. Porokeratotic lesion(s) submet head 1 right foot. No erythema, no edema, no drainage, no fluctuance.  Musculoskeletal:  Normal muscle strength 5/5 to all lower extremity muscle groups bilaterally. Dropfoot left foot. Utilizes motorized chair for mobility assistance.  Neurological:  Protective sensation decreased with 10 gram monofilament b/l.  Assessment and Plan:  1. Porokeratosis   2. Neuropathy   -Examined patient. -Patient to continue soft, supportive shoe gear daily. -Painful porokeratotic lesion(s) submet head 1 right foot pared and enucleated with sterile scalpel blade without incident. Total number of lesions debrided=1. -Patient to report any pedal injuries to medical professional immediately. -Patient/POA to call should there be question/concern in the interim.  Return in about 3 months (around 04/30/2021).  Marzetta Board, DPM

## 2021-02-20 ENCOUNTER — Encounter: Payer: Medicare Other | Admitting: Gastroenterology

## 2021-04-04 ENCOUNTER — Other Ambulatory Visit: Payer: Self-pay | Admitting: Family Medicine

## 2021-04-04 DIAGNOSIS — I10 Essential (primary) hypertension: Secondary | ICD-10-CM

## 2021-04-14 ENCOUNTER — Ambulatory Visit (INDEPENDENT_AMBULATORY_CARE_PROVIDER_SITE_OTHER): Payer: Medicare Other

## 2021-04-14 VITALS — Ht 60.0 in | Wt 237.0 lb

## 2021-04-14 DIAGNOSIS — Z0000 Encounter for general adult medical examination without abnormal findings: Secondary | ICD-10-CM
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
## 2021-04-14 NOTE — Patient Instructions (Signed)
Ms. Kelly [name] , Thank you for taking time to complete your Medicare Wellness Visit. I appreciate your ongoing commitment to your health goals. Please review the following plan we discussed and let me know if I can assist you in the future.   Screening recommendations/referrals: Colonoscopy: Completed-12/2020-No longer required Mammogram: Completed 01/10/2021-Due 01/10/2022 Bone Density: Per our conversation, you will have done with mammogram next year. Recommended yearly ophthalmology/optometry visit for glaucoma screening and checkup Recommended yearly dental visit for hygiene and checkup  Vaccinations: Influenza vaccine: Up to date Pneumococcal vaccine: Up to date Tdap vaccine: Up to date-Due-10/30/2023 Shingles vaccine: Completed vaccines   Covid-19:Booster available at the pharmacy  Advanced directives: Please bring a copy of Living Will and/or Healthcare Power of Attorney for your chart.   Conditions/risks identified: See problem list  Next appointment: Follow up in one year for your annual wellness visit 04/16/2022 @ 1:00   Preventive Care 65 Years and Older, Female Preventive care refers to lifestyle choices and visits with your health care provider that can promote health and wellness. What does preventive care include? A yearly physical exam. This is also called an annual well check. Dental exams once or twice a year. Routine eye exams. Ask your health care provider how often you should have your eyes checked. Personal lifestyle choices, including: Daily care of your teeth and gums. Regular physical activity. Eating a healthy diet. Avoiding tobacco and drug use. Limiting alcohol use. Practicing safe sex. Taking low-dose aspirin every day. Taking vitamin and mineral supplements as recommended by your health care provider. What happens during an annual well check? The services and screenings done by your health care provider during your annual well check will depend on your  age, overall health, lifestyle risk factors, and family history of disease. Counseling  Your health care provider may ask you questions about your: Alcohol use. Tobacco use. Drug use. Emotional well-being. Home and relationship well-being. Sexual activity. Eating habits. History of falls. Memory and ability to understand (cognition). Work and work Statistician. Reproductive health. Screening  You may have the following tests or measurements: Height, weight, and BMI. Blood pressure. Lipid and cholesterol levels. These may be checked every 5 years, or more frequently if you are over 72 years old. Skin check. Lung cancer screening. You may have this screening every year starting at age 72 if you have a 30-pack-year history of smoking and currently smoke or have quit within the past 15 years. Fecal occult blood test (FOBT) of the stool. You may have this test every year starting at age 72. Flexible sigmoidoscopy or colonoscopy. You may have a sigmoidoscopy every 5 years or a colonoscopy every 10 years starting at age 72. Hepatitis C blood test. Hepatitis B blood test. Sexually transmitted disease (STD) testing. Diabetes screening. This is done by checking your blood sugar (glucose) after you have not eaten for a while (fasting). You may have this done every 1-3 years. Bone density scan. This is done to screen for osteoporosis. You may have this done starting at age 72. Mammogram. This may be done every 1-2 years. Talk to your health care provider about how often you should have regular mammograms. Talk with your health care provider about your test results, treatment options, and if necessary, the need for more tests. Vaccines  Your health care provider may recommend certain vaccines, such as: Influenza vaccine. This is recommended every year. Tetanus, diphtheria, and acellular pertussis (Tdap, Td) vaccine. You may need a Td booster every 10 years. Zoster  vaccine. You may need this after  age 72. Pneumococcal 13-valent conjugate (PCV13) vaccine. One dose is recommended after age 72. Pneumococcal polysaccharide (PPSV23) vaccine. One dose is recommended after age 72. Talk to your health care provider about which screenings and vaccines you need and how often you need them. This information is not intended to replace advice given to you by your health care provider. Make sure you discuss any questions you have with your health care provider. Document Released: 06/28/2015 Document Revised: 02/19/2016 Document Reviewed: 04/02/2015 Elsevier Interactive Patient Education  2017 [employer] Prevention in the Home Falls can cause injuries. They can happen to people of all ages. There are many things you can do to make your home safe and to help prevent falls. What can I do on the outside of my home? Regularly fix the edges of walkways and driveways and fix any cracks. Remove anything that might make you trip as you walk through a door, such as a raised step or threshold. Trim any bushes or trees on the path to your home. Use bright outdoor lighting. Clear any walking paths of anything that might make someone trip, such as rocks or tools. Regularly check to see if handrails are loose or broken. Make sure that both sides of any steps have handrails. Any raised decks and porches should have guardrails on the edges. Have any leaves, snow, or ice cleared regularly. Use sand or salt on walking paths during winter. Clean up any spills in your garage right away. This includes oil or grease spills. What can I do in the bathroom? Use night lights. Install grab bars by the toilet and in the tub and shower. Do not use towel bars as grab bars. Use non-skid mats or decals in the tub or shower. If you need to sit down in the shower, use a plastic, non-slip stool. Keep the floor dry. Clean up any water that spills on the floor as soon as it happens. Remove soap buildup in the tub or shower  regularly. Attach bath mats securely with double-sided non-slip rug tape. Do not have throw rugs and other things on the floor that can make you trip. What can I do in the bedroom? Use night lights. Make sure that you have a light by your bed that is easy to reach. Do not use any sheets or blankets that are too big for your bed. They should not hang down onto the floor. Have a firm chair that has side arms. You can use this for support while you get dressed. Do not have throw rugs and other things on the floor that can make you trip. What can I do in the kitchen? Clean up any spills right away. Avoid walking on wet floors. Keep items that you use a lot in easy-to-reach places. If you need to reach something above you, use a strong step stool that has a grab bar. Keep electrical cords out of the way. Do not use floor polish or wax that makes floors slippery. If you must use wax, use non-skid floor wax. Do not have throw rugs and other things on the floor that can make you trip. What can I do with my stairs? Do not leave any items on the stairs. Make sure that there are handrails on both sides of the stairs and use them. Fix handrails that are broken or loose. Make sure that handrails are as long as the stairways. Check any carpeting to make sure that it  is firmly attached to the stairs. Fix any carpet that is loose or worn. Avoid having throw rugs at the top or bottom of the stairs. If you do have throw rugs, attach them to the floor with carpet tape. Make sure that you have a light switch at the top of the stairs and the bottom of the stairs. If you do not have them, ask someone to add them for you. What else can I do to help prevent falls? Wear shoes that: Do not have high heels. Have rubber bottoms. Are comfortable and fit you well. Are closed at the toe. Do not wear sandals. If you use a stepladder: Make sure that it is fully opened. Do not climb a closed stepladder. Make sure that  both sides of the stepladder are locked into place. Ask someone to hold it for you, if possible. Clearly mark and make sure that you can see: Any grab bars or handrails. First and last steps. Where the edge of each step is. Use tools that help you move around (mobility aids) if they are needed. These include: Canes. Walkers. Scooters. Crutches. Turn on the lights when you go into a dark area. Replace any light bulbs as soon as they burn out. Set up your furniture so you have a clear path. Avoid moving your furniture around. If any of your floors are uneven, fix them. If there are any pets around you, be aware of where they are. Review your medicines with your doctor. Some medicines can make you feel dizzy. This can increase your chance of falling. Ask your doctor what other things that you can do to help prevent falls. This information is not intended to replace advice given to you by your health care provider. Make sure you discuss any questions you have with your health care provider. Document Released: 03/28/2009 Document Revised: 11/07/2015 Document Reviewed: 07/06/2014 Elsevier Interactive Patient Education  2017 [employer].

## 2021-05-06 ENCOUNTER — Ambulatory Visit: Payer: Medicare Other | Admitting: Podiatry

## 2021-06-20 ENCOUNTER — Other Ambulatory Visit: Payer: Self-pay | Admitting: Family Medicine

## 2021-06-20 DIAGNOSIS — K219 Gastro-esophageal reflux disease without esophagitis: Secondary | ICD-10-CM

## 2021-06-20 DIAGNOSIS — I10 Essential (primary) hypertension: Secondary | ICD-10-CM

## 2021-06-26 ENCOUNTER — Encounter: Payer: Self-pay | Admitting: Family Medicine

## 2021-06-26 ENCOUNTER — Telehealth: Payer: Self-pay | Admitting: Family Medicine

## 2021-06-26 ENCOUNTER — Ambulatory Visit (INDEPENDENT_AMBULATORY_CARE_PROVIDER_SITE_OTHER): Payer: Commercial Managed Care - HMO | Admitting: Family Medicine

## 2021-06-26 VITALS — BP 120/80 | HR 101 | Temp 97.6°F | Resp 18 | Ht 60.0 in

## 2021-06-26 DIAGNOSIS — E039 Hypothyroidism, unspecified: Secondary | ICD-10-CM | POA: Diagnosis not present

## 2021-06-26 DIAGNOSIS — G8194 Hemiplegia, unspecified affecting left nondominant side: Secondary | ICD-10-CM

## 2021-06-26 DIAGNOSIS — I82409 Acute embolism and thrombosis of unspecified deep veins of unspecified lower extremity: Secondary | ICD-10-CM

## 2021-06-26 DIAGNOSIS — R69 Illness, unspecified: Secondary | ICD-10-CM | POA: Diagnosis not present

## 2021-06-26 DIAGNOSIS — I10 Essential (primary) hypertension: Secondary | ICD-10-CM

## 2021-06-26 DIAGNOSIS — E785 Hyperlipidemia, unspecified: Secondary | ICD-10-CM

## 2021-06-26 NOTE — Telephone Encounter (Signed)
Pt was seen in ov today 1/12 and forgot to mention that she had all her teeth removed in oct. Pt stated she is unable to eat solid foods which is causing her to become constipated. Pt would like advice. Please advise.

## 2021-06-26 NOTE — Assessment & Plan Note (Signed)
Encourage heart healthy diet such as MIND or DASH diet, increase exercise, avoid trans fats, simple carbohydrates and processed foods, consider a krill or fish or flaxseed oil cap daily.  °
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
## 2021-06-26 NOTE — Patient Instructions (Signed)

## 2021-06-26 NOTE — Assessment & Plan Note (Signed)
Stable

## 2021-06-26 NOTE — Assessment & Plan Note (Signed)
Check labs 

## 2021-06-26 NOTE — Assessment & Plan Note (Signed)
Well controlled, no changes to meds. Encouraged heart healthy diet such as the DASH diet and exercise as tolerated.  °

## 2021-06-27 LAB — COMPREHENSIVE METABOLIC PANEL
ALT: 13 U/L (ref 0–35)
AST: 14 U/L (ref 0–37)
Albumin: 4 g/dL (ref 3.5–5.2)
Alkaline Phosphatase: 121 U/L — ABNORMAL HIGH (ref 39–117)
BUN: 21 mg/dL (ref 6–23)
CO2: 29 mEq/L (ref 19–32)
Calcium: 9 mg/dL (ref 8.4–10.5)
Chloride: 104 mEq/L (ref 96–112)
Creatinine, Ser: 0.5 mg/dL (ref 0.40–1.20)
GFR: 93.78 mL/min (ref >60.00)
Glucose, Bld: 107 mg/dL — ABNORMAL HIGH (ref 70–99)
Potassium: 4 mEq/L (ref 3.5–5.1)
Sodium: 143 mEq/L (ref 135–145)
Total Bilirubin: 0.9 mg/dL (ref 0.2–1.2)
Total Protein: 6.7 g/dL (ref 6.0–8.3)

## 2021-06-27 LAB — LIPID PANEL
Cholesterol: 144 mg/dL (ref 0–200)
HDL: 35.5 mg/dL — ABNORMAL LOW (ref >39.00)
LDL Cholesterol: 79 mg/dL (ref 0–99)
NonHDL: 108.53
Total CHOL/HDL Ratio: 4
Triglycerides: 147 mg/dL (ref 0.0–149.0)
VLDL: 29.4 mg/dL (ref 0.0–40.0)

## 2021-06-27 LAB — TSH: TSH: 4.34 u[IU]/mL (ref 0.35–5.50)

## 2021-06-27 NOTE — Telephone Encounter (Signed)
Spoke with pt and advised Miralax. Pt will call back if it doesn't work. Pt was advised it could take a couple days for Miralax to work and to follow instructions on the bottle.

## 2021-07-30 ENCOUNTER — Other Ambulatory Visit: Payer: Self-pay | Admitting: Family Medicine

## 2021-07-30 DIAGNOSIS — E039 Hypothyroidism, unspecified: Secondary | ICD-10-CM

## 2021-08-11 ENCOUNTER — Telehealth: Payer: Self-pay | Admitting: Family Medicine

## 2021-08-11 NOTE — Telephone Encounter (Signed)
[place] called from [place] regarding pt.   Forms were faxed to office. Received back 1 missing NCD[employer] form. Resent form on 2/17 but have no received information  Please advise.  [phone]

## 2021-08-13 NOTE — Telephone Encounter (Signed)
Called back to have forms resent. Newer copies haven't been scanned into chart yet ?

## 2021-08-18 NOTE — Telephone Encounter (Signed)
Spoke to [place], forms will be faxed to [place] again. Fax number verified.  ?

## 2021-08-20 ENCOUNTER — Other Ambulatory Visit: Payer: Self-pay | Admitting: Family Medicine

## 2021-08-20 DIAGNOSIS — E785 Hyperlipidemia, unspecified: Secondary | ICD-10-CM

## 2021-08-20 DIAGNOSIS — M792 Neuralgia and neuritis, unspecified: Secondary | ICD-10-CM

## 2021-09-01 ENCOUNTER — Ambulatory Visit (INDEPENDENT_AMBULATORY_CARE_PROVIDER_SITE_OTHER): Payer: Medicare Other | Admitting: Podiatry

## 2021-09-01 ENCOUNTER — Other Ambulatory Visit: Payer: Self-pay

## 2021-09-01 DIAGNOSIS — M7752 Other enthesopathy of left foot: Secondary | ICD-10-CM | POA: Diagnosis not present

## 2021-09-01 DIAGNOSIS — M778 Other enthesopathies, not elsewhere classified: Secondary | ICD-10-CM | POA: Diagnosis not present

## 2021-09-01 MED ORDER — BETAMETHASONE SOD PHOS & ACET 6 (3-3) MG/ML IJ SUSP: 3.0000 mg | Freq: Once | INTRAMUSCULAR | Status: AC
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
## 2021-10-01 ENCOUNTER — Other Ambulatory Visit: Payer: Self-pay | Admitting: Family Medicine

## 2021-10-01 DIAGNOSIS — I10 Essential (primary) hypertension: Secondary | ICD-10-CM

## 2021-10-24 ENCOUNTER — Other Ambulatory Visit: Payer: Self-pay | Admitting: Family Medicine

## 2021-12-08 ENCOUNTER — Other Ambulatory Visit: Payer: Self-pay | Admitting: Family Medicine

## 2021-12-08 DIAGNOSIS — Z1231 Encounter for screening mammogram for malignant neoplasm of breast: Secondary | ICD-10-CM

## 2021-12-11 ENCOUNTER — Telehealth: Payer: Self-pay | Admitting: Family Medicine

## 2021-12-11 NOTE — Telephone Encounter (Signed)
[employer] [facility] [employer] called to get an update on the orders faxed over to [place] for under pads, gloves, and pull ups. Rep stated that the pt wants to increase the number of gloves from one box to two boxes, so they need a new order for that as well. She stated the orders can be faxed to them at fax number [phone]. Asked rep to fax the orders to [place] again and verified fax number. Please advise.

## 2021-12-12 NOTE — Telephone Encounter (Signed)
Have not received orders yet

## 2021-12-17 ENCOUNTER — Other Ambulatory Visit: Payer: Self-pay | Admitting: Family Medicine

## 2021-12-17 DIAGNOSIS — K219 Gastro-esophageal reflux disease without esophagitis: Secondary | ICD-10-CM

## 2021-12-17 DIAGNOSIS — I10 Essential (primary) hypertension: Secondary | ICD-10-CM

## 2021-12-19 NOTE — Telephone Encounter (Signed)
[employer] stated they are still waiting on orders. Advised them to re-fax and confirmed fax.

## 2021-12-22 NOTE — Telephone Encounter (Signed)
Orders received. Placed in folder for sig

## 2021-12-25 ENCOUNTER — Telehealth (INDEPENDENT_AMBULATORY_CARE_PROVIDER_SITE_OTHER): Payer: Medicare Other | Admitting: Family

## 2021-12-25 ENCOUNTER — Encounter: Payer: Commercial Managed Care - HMO | Admitting: Family Medicine

## 2021-12-25 DIAGNOSIS — M810 Age-related osteoporosis without current pathological fracture: Secondary | ICD-10-CM | POA: Diagnosis not present

## 2021-12-25 DIAGNOSIS — D126 Benign neoplasm of colon, unspecified: Secondary | ICD-10-CM | POA: Diagnosis not present

## 2021-12-25 DIAGNOSIS — R609 Edema, unspecified: Secondary | ICD-10-CM | POA: Diagnosis not present

## 2021-12-25 DIAGNOSIS — M1990 Unspecified osteoarthritis, unspecified site: Secondary | ICD-10-CM | POA: Diagnosis not present

## 2021-12-25 DIAGNOSIS — J209 Acute bronchitis, unspecified: Secondary | ICD-10-CM

## 2021-12-25 MED ORDER — BENZONATATE 100 MG PO CAPS: 100.0000 mg | ORAL_CAPSULE | Freq: Three times a day (TID) | ORAL | 0 refills | Status: DC | PRN

## 2021-12-25 MED ORDER — AZITHROMYCIN 250 MG PO TABS
ORAL_TABLET | ORAL | 0 refills | Status: DC
Start: 2021-12-25 — End: 2022-02-26
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
## 2022-01-06 ENCOUNTER — Encounter: Payer: Self-pay | Admitting: Family Medicine

## 2022-01-06 ENCOUNTER — Ambulatory Visit (INDEPENDENT_AMBULATORY_CARE_PROVIDER_SITE_OTHER): Payer: Medicare Other | Admitting: Family Medicine

## 2022-01-06 VITALS — BP 120/80 | HR 77 | Temp 97.7°F | Resp 18 | Ht 60.0 in

## 2022-01-06 DIAGNOSIS — J40 Bronchitis, not specified as acute or chronic: Secondary | ICD-10-CM

## 2022-01-06 DIAGNOSIS — I10 Essential (primary) hypertension: Secondary | ICD-10-CM

## 2022-01-06 DIAGNOSIS — J069 Acute upper respiratory infection, unspecified: Secondary | ICD-10-CM | POA: Diagnosis not present

## 2022-01-06 DIAGNOSIS — E785 Hyperlipidemia, unspecified: Secondary | ICD-10-CM

## 2022-01-06 DIAGNOSIS — G8194 Hemiplegia, unspecified affecting left nondominant side: Secondary | ICD-10-CM | POA: Diagnosis not present

## 2022-01-06 DIAGNOSIS — Z0000 Encounter for general adult medical examination without abnormal findings: Secondary | ICD-10-CM | POA: Diagnosis not present

## 2022-01-06 DIAGNOSIS — E039 Hypothyroidism, unspecified: Secondary | ICD-10-CM | POA: Diagnosis not present

## 2022-01-06 DIAGNOSIS — E2839 Other primary ovarian failure: Secondary | ICD-10-CM

## 2022-01-06 MED ORDER — PROMETHAZINE-DM 6.25-15 MG/5ML PO SYRP: 5.0000 mL | ORAL_SOLUTION | Freq: Four times a day (QID) | ORAL | 0 refills | Status: DC | PRN

## 2022-01-06 MED ORDER — AMOXICILLIN-POT CLAVULANATE 875-125 MG PO TABS: 1.0000 | ORAL_TABLET | Freq: Two times a day (BID) | ORAL | 0 refills | Status: DC

## 2022-01-06 MED ORDER — FLUTICASONE PROPIONATE 50 MCG/ACT NA SUSP: 2.0000 | Freq: Every day | NASAL | 6 refills | Status: DC
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
## 2022-01-06 NOTE — Patient Instructions (Signed)
Preventive Care 65 Years and Older, Female Preventive care refers to lifestyle choices and visits with your health care provider that can promote health and wellness. Preventive care visits are also called wellness exams. What can I expect for my preventive care visit? Counseling Your health care provider may ask you questions about your: Medical history, including: Past medical problems. Family medical history. Pregnancy and menstrual history. History of falls. Current health, including: Memory and ability to understand (cognition). Emotional well-being. Home life and relationship well-being. Sexual activity and sexual health. Lifestyle, including: Alcohol, nicotine or tobacco, and drug use. Access to firearms. Diet, exercise, and sleep habits. Work and work environment. Sunscreen use. Safety issues such as seatbelt and bike helmet use. Physical exam Your health care provider will check your: Height and weight. These may be used to calculate your BMI (body mass index). BMI is a measurement that tells if you are at a healthy weight. Waist circumference. This measures the distance around your waistline. This measurement also tells if you are at a healthy weight and may help predict your risk of certain diseases, such as type 2 diabetes and high blood pressure. Heart rate and blood pressure. Body temperature. Skin for abnormal spots. What immunizations do I need?  Vaccines are usually given at various ages, according to a schedule. Your health care provider will recommend vaccines for you based on your age, medical history, and lifestyle or other factors, such as travel or where you work. What tests do I need? Screening Your health care provider may recommend screening tests for certain conditions. This may include: Lipid and cholesterol levels. Hepatitis C test. Hepatitis B test. HIV (human immunodeficiency virus) test. STI (sexually transmitted infection) testing, if you are at  risk. Lung cancer screening. Colorectal cancer screening. Diabetes screening. This is done by checking your blood sugar (glucose) after you have not eaten for a while (fasting). Mammogram. Talk with your health care provider about how often you should have regular mammograms. BRCA-related cancer screening. This may be done if you have a family history of breast, ovarian, tubal, or peritoneal cancers. Bone density scan. This is done to screen for osteoporosis. Talk with your health care provider about your test results, treatment options, and if necessary, the need for more tests. Follow these instructions at home: Eating and drinking  Eat a diet that includes fresh fruits and vegetables, whole grains, lean protein, and low-fat dairy products. Limit your intake of foods with high amounts of sugar, saturated fats, and salt. Take vitamin and mineral supplements as recommended by your health care provider. Do not drink alcohol if your health care provider tells you not to drink. If you drink alcohol: Limit how much you have to 0-1 drink a day. Know how much alcohol is in your drink. In the [place] one drink equals one 12 oz bottle of beer (355 mL), one 5 oz glass of wine (148 mL), or one 1 oz glass of hard liquor (44 mL). Lifestyle Brush your teeth every morning and night with fluoride toothpaste. Floss one time each day. Exercise for at least 30 minutes 5 or more days each week. Do not use any products that contain nicotine or tobacco. These products include cigarettes, chewing tobacco, and vaping devices, such as e-cigarettes. If you need help quitting, ask your health care provider. Do not use drugs. If you are sexually active, practice safe sex. Use a condom or other form of protection in order to prevent STIs. Take aspirin only as told by   your health care provider. Make sure that you understand how much to take and what form to take. Work with your health care provider to find out whether it  is safe and beneficial for you to take aspirin daily. Ask your health care provider if you need to take a cholesterol-lowering medicine (statin). Find healthy ways to manage stress, such as: Meditation, yoga, or listening to music. Journaling. Talking to a trusted person. Spending time with friends and family. Minimize exposure to UV radiation to reduce your risk of skin cancer. Safety Always wear your seat belt while driving or riding in a vehicle. Do not drive: If you have been drinking alcohol. Do not ride with someone who has been drinking. When you are tired or distracted. While texting. If you have been using any mind-altering substances or drugs. Wear a helmet and other protective equipment during sports activities. If you have firearms in your house, make sure you follow all gun safety procedures. What's next? Visit your health care provider once a year for an annual wellness visit. Ask your health care provider how often you should have your eyes and teeth checked. Stay up to date on all vaccines. This information is not intended to replace advice given to you by your health care provider. Make sure you discuss any questions you have with your health care provider. Document Revised: 11/27/2020 Document Reviewed: 11/27/2020 Elsevier Patient Education  2023 [employer]  

## 2022-01-07 LAB — COMPREHENSIVE METABOLIC PANEL
ALT: 12 U/L (ref 0–35)
AST: 12 U/L (ref 0–37)
Albumin: 3.8 g/dL (ref 3.5–5.2)
Alkaline Phosphatase: 124 U/L — ABNORMAL HIGH (ref 39–117)
BUN: 13 mg/dL (ref 6–23)
CO2: 31 mEq/L (ref 19–32)
Calcium: 8.8 mg/dL (ref 8.4–10.5)
Chloride: 104 mEq/L (ref 96–112)
Creatinine, Ser: 0.53 mg/dL (ref 0.40–1.20)
GFR: 92.12 mL/min (ref >60.00)
Glucose, Bld: 110 mg/dL — ABNORMAL HIGH (ref 70–99)
Potassium: 4.1 mEq/L (ref 3.5–5.1)
Sodium: 141 mEq/L (ref 135–145)
Total Bilirubin: 0.7 mg/dL (ref 0.2–1.2)
Total Protein: 6.7 g/dL (ref 6.0–8.3)

## 2022-01-07 LAB — LIPID PANEL
Cholesterol: 150 mg/dL (ref 0–200)
HDL: 36.9 mg/dL — ABNORMAL LOW (ref >39.00)
LDL Cholesterol: 84 mg/dL (ref 0–99)
NonHDL: 113.05
Total CHOL/HDL Ratio: 4
Triglycerides: 144 mg/dL (ref 0.0–149.0)
VLDL: 28.8 mg/dL (ref 0.0–40.0)

## 2022-01-07 LAB — CBC WITH DIFFERENTIAL/PLATELET
Basophils Absolute: 0.1 10*3/uL (ref 0.0–0.1)
Basophils Relative: 1.3 % (ref 0.0–3.0)
Eosinophils Absolute: 0.2 10*3/uL (ref 0.0–0.7)
Eosinophils Relative: 2.7 % (ref 0.0–5.0)
HCT: 36.2 % (ref 36.0–46.0)
Hemoglobin: 11.5 g/dL — ABNORMAL LOW (ref 12.0–15.0)
Lymphocytes Relative: 23 % (ref 12.0–46.0)
Lymphs Abs: 1.9 10*3/uL (ref 0.7–4.0)
MCHC: 31.8 g/dL (ref 30.0–36.0)
MCV: 84 fl (ref 78.0–100.0)
Monocytes Absolute: 0.4 10*3/uL (ref 0.1–1.0)
Monocytes Relative: 5.2 % (ref 3.0–12.0)
Neutro Abs: 5.7 10*3/uL (ref 1.4–7.7)
Neutrophils Relative %: 67.8 % (ref 43.0–77.0)
Platelets: 221 10*3/uL (ref 150.0–400.0)
RBC: 4.32 Mil/uL (ref 3.87–5.11)
RDW: 16.4 % — ABNORMAL HIGH (ref 11.5–15.5)
WBC: 8.4 10*3/uL (ref 4.0–10.5)

## 2022-01-07 LAB — TSH: TSH: 2.99 u[IU]/mL (ref 0.35–5.50)

## 2022-01-07 NOTE — Assessment & Plan Note (Signed)
Stable

## 2022-01-07 NOTE — Assessment & Plan Note (Signed)
Well controlled, no changes to meds. Encouraged heart healthy diet such as the DASH diet and exercise as tolerated.  °

## 2022-01-07 NOTE — Assessment & Plan Note (Signed)
Encourage heart healthy diet such as MIND or DASH diet, increase exercise, avoid trans fats, simple carbohydrates and processed foods, consider a krill or fish or flaxseed oil cap daily.  °

## 2022-01-07 NOTE — Assessment & Plan Note (Signed)
Check labs con't synthroid 

## 2022-01-12 ENCOUNTER — Ambulatory Visit
Admission: RE | Admit: 2022-01-12 | Discharge: 2022-01-12 | Disposition: A | Discharge status: Home / Self Care | Payer: Medicare Other | Source: Ambulatory Visit | Attending: Family Medicine | Admitting: Family Medicine

## 2022-01-12 DIAGNOSIS — Z1231 Encounter for screening mammogram for malignant neoplasm of breast: Secondary | ICD-10-CM

## 2022-01-23 ENCOUNTER — Telehealth: Payer: Self-pay | Admitting: Family Medicine

## 2022-01-23 ENCOUNTER — Other Ambulatory Visit: Payer: Self-pay

## 2022-01-23 DIAGNOSIS — E039 Hypothyroidism, unspecified: Secondary | ICD-10-CM

## 2022-01-23 DIAGNOSIS — E785 Hyperlipidemia, unspecified: Secondary | ICD-10-CM

## 2022-01-23 DIAGNOSIS — I10 Essential (primary) hypertension: Secondary | ICD-10-CM

## 2022-01-23 NOTE — Telephone Encounter (Signed)
Patient would like to schedule her labs for the recheck in 6 months. Please place order for labs and call patient to advise

## 2022-01-23 NOTE — Telephone Encounter (Signed)
Labs orders. Pt needs 6 month lab appt please

## 2022-01-27 ENCOUNTER — Other Ambulatory Visit: Payer: Self-pay | Admitting: Family Medicine

## 2022-01-27 DIAGNOSIS — E039 Hypothyroidism, unspecified: Secondary | ICD-10-CM

## 2022-02-23 ENCOUNTER — Ambulatory Visit: Payer: Medicare Other | Admitting: Family Medicine

## 2022-02-26 ENCOUNTER — Ambulatory Visit (INDEPENDENT_AMBULATORY_CARE_PROVIDER_SITE_OTHER): Payer: Medicare Other | Admitting: Family Medicine

## 2022-02-26 ENCOUNTER — Encounter: Payer: Self-pay | Admitting: Family Medicine

## 2022-02-26 VITALS — BP 118/80 | HR 67 | Temp 97.6°F | Resp 18 | Ht 60.0 in

## 2022-02-26 DIAGNOSIS — Z23 Encounter for immunization: Secondary | ICD-10-CM | POA: Diagnosis not present

## 2022-02-26 DIAGNOSIS — Z7409 Other reduced mobility: Secondary | ICD-10-CM | POA: Diagnosis not present

## 2022-02-26 DIAGNOSIS — R296 Repeated falls: Secondary | ICD-10-CM

## 2022-02-26 DIAGNOSIS — I10 Essential (primary) hypertension: Secondary | ICD-10-CM

## 2022-02-26 DIAGNOSIS — R600 Localized edema: Secondary | ICD-10-CM | POA: Diagnosis not present

## 2022-02-26 DIAGNOSIS — E785 Hyperlipidemia, unspecified: Secondary | ICD-10-CM

## 2022-02-26 DIAGNOSIS — G8194 Hemiplegia, unspecified affecting left nondominant side: Secondary | ICD-10-CM

## 2022-02-26 MED ORDER — IBUPROFEN 600 MG PO TABS
600.0000 mg | ORAL_TABLET | Freq: Four times a day (QID) | ORAL | 1 refills | Status: DC | PRN
Start: 2022-02-26 — End: 2023-03-24

## 2022-02-26 NOTE — Assessment & Plan Note (Signed)
Pt doing well with hemi walker and powerwheel chair at home but is getting weaker and stumbling more---- high fall risk

## 2022-02-26 NOTE — Assessment & Plan Note (Signed)
Stable doing well. 

## 2022-02-26 NOTE — Patient Instructions (Signed)
Hemiparesis  Hemiparesis is weakness on one side of the body, such as one arm and one leg. Hemiparesis often happens after a stroke. The weakness is usually on the side of the body that is opposite from the side of the brain that was affected by the stroke. For example, a stroke in the left side of the brain may cause hemiparesis on the right side of the body. What are the causes? This condition may be caused by: Stroke. Brain injury (trauma). Brain tumor. Multiple sclerosis. Infections or brain abscesses. Seizures. This is called postictal or Todd's paralysis. Migraine headaches. A type of dementia called mitochondrial encephalomyopathy, lactic acidosis, and stroke-like episodes (MELAS). What are the signs or symptoms? Symptoms of this condition include: Weakness, or loss of muscle strength, on one side of the body. This may affect the arm, leg, or face. Trouble doing daily activities, such as dressing or bathing. Loss of balance, problems with coordination, or both. Trouble walking. Problems grabbing things or holding them. Pusher syndrome. This means you push toward the weaker side of your body and often lose your balance. How is this diagnosed? This condition may be diagnosed based on: A physical exam. Your medical history. Imaging tests, such as a CT scan or MRI. How is this treated? Treatment for this condition includes: Physical therapy to help improve your strength, balance, and coordination. Occupational therapy to help you to do basic daily tasks, such as bathing and feeding yourself. Using devices to help you move around (assistive devices). These include canes, walkers, or wheelchairs. Exercises that are done in a pool (hydrotherapy). Medicines that help with stiffness and discomfort. Modified constraint-induced movement therapy (mCIMT). This therapy restricts one side of your body so that you have to use the weaker side. Electrical stimulation. This involves stimulating  the muscles on the weaker side of your body with small electrical pads. Experimental treatments like cortical stimulation therapy. This involves stimulating your brain with electrical currents to make it work better. Follow these instructions at home: Safety  Use assistive devices as instructed. Your risk of falling is higher because of hemiparesis. Make changes in your home to lower your risk of falls. These may include: Removing area rugs and mats from the floor. Getting rid of clutter. Adding rails or grab bars in bathrooms. Activity Do not drive unless your health care provider approves. Do not drive or use heavy machinery while taking medicine that may affect your alertness or reaction time. Return to your normal activities as told by your health care provider. Ask your health care provider what activities are safe for you. Ask your health care provider about getting extra help at home. You may have problems doing daily tasks, such as dressing, bathing, using the bathroom, and eating. General instructions Do not use any products that contain nicotine or tobacco. These products include cigarettes, chewing tobacco, and vaping devices, such as e-cigarettes. If you need help quitting, ask your health care provider. Take over-the-counter and prescription medicines only as told by your health care provider. Wear clothes and shoes that are easy to put on and take off. Try to avoid zippers and buttons. Avoid drinking alcohol. Keep all follow-up visits. This is important. Contact a health care provider if: Your symptoms get worse and medicines do not help. You have severe pain. You have repeated falls. Get help right away if: You have jerky movements that you cannot control (seizure). You have trouble breathing. You have chest pain. These symptoms may represent a serious problem  that is an emergency. Do not wait to see if the symptoms will go away. Get medical help right away. Call your  local emergency services (911 in the [place] Do not drive yourself to the hospital. Summary Hemiparesis is weakness on one side of the body, such as one arm and one leg. This often happens after a stroke or brain injury. Treatment usually includes physical and occupational therapy. Occupational therapy can help you with everyday tasks, such as dressing, using the bathroom, and eating. Your risk of falling is higher with this condition. You may need to use a wheelchair, walker, or other assistive device. Make changes in your home to prevent falls, such as removing area rugs and clutter, and installing grab bars in the bathroom. This information is not intended to replace advice given to you by your health care provider. Make sure you discuss any questions you have with your health care provider. Document Revised: 10/01/2021 Document Reviewed: 05/26/2020 Elsevier Patient Education  [employer].

## 2022-02-26 NOTE — Assessment & Plan Note (Signed)
Well controlled, no changes to meds. Encouraged heart healthy diet such as the DASH diet and exercise as tolerated.  °

## 2022-02-26 NOTE — Assessment & Plan Note (Signed)
Mobility eval done
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
## 2022-02-26 NOTE — Assessment & Plan Note (Signed)
Encourage heart healthy diet such as MIND or DASH diet, increase exercise, avoid trans fats, simple carbohydrates and processed foods, consider a krill or fish or flaxseed oil cap daily.  °

## 2022-03-02 ENCOUNTER — Telehealth: Payer: Self-pay

## 2022-03-02 NOTE — Telephone Encounter (Signed)
Standard written order and office notes faxed to [employer] at [phone]

## 2022-03-13 NOTE — Telephone Encounter (Signed)
Patient called to advise her mobility test evaluation form for her electric wheelchair has not been received. Fax # [phone]. Please call patient when form is refaxed so she can follow up with them.

## 2022-03-30 ENCOUNTER — Other Ambulatory Visit: Payer: Self-pay | Admitting: Family Medicine

## 2022-03-30 DIAGNOSIS — I10 Essential (primary) hypertension: Secondary | ICD-10-CM

## 2022-04-16 ENCOUNTER — Ambulatory Visit (INDEPENDENT_AMBULATORY_CARE_PROVIDER_SITE_OTHER): Payer: Medicare Other | Admitting: *Deleted

## 2022-04-16 DIAGNOSIS — Z0000 Encounter for general adult medical examination without abnormal findings: Secondary | ICD-10-CM | POA: Diagnosis not present
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
## 2022-04-16 NOTE — Patient Instructions (Signed)
Kelly [name] , Thank you for taking time to come for your Medicare Wellness Visit. I appreciate your ongoing commitment to your health goals. Please review the following plan we discussed and let me know if I can assist you in the future.   These are the goals we discussed:  Goals       240lb (pt-stated)        This is a list of the screening recommended for you and due dates:  Health Maintenance  Topic Date Due   COVID-19 Vaccine (4 - Moderna series) 02/11/2021   DEXA scan (bone density measurement)  03/01/2021   Medicare Annual Wellness Visit  04/17/2023   Tetanus Vaccine  10/30/2023   Mammogram  01/13/2024   Colon Cancer Screening  01/04/2031   Pneumonia Vaccine  Completed   Flu Shot  Completed   Hepatitis C Screening: USPSTF Recommendation to screen - Ages 18-79 yo.  Completed   Zoster (Shingles) Vaccine  Completed   HPV Vaccine  Aged Out     Next appointment: Follow up in one year for your annual wellness visit.   Preventive Care 75 Years and Older, Female Preventive care refers to lifestyle choices and visits with your health care provider that can promote health and wellness. What does preventive care include? A yearly physical exam. This is also called an annual well check. Dental exams once or twice a year. Routine eye exams. Ask your health care provider how often you should have your eyes checked. Personal lifestyle choices, including: Daily care of your teeth and gums. Regular physical activity. Eating a healthy diet. Avoiding tobacco and drug use. Limiting alcohol use. Practicing safe sex. Taking low-dose aspirin every day. Taking vitamin and mineral supplements as recommended by your health care provider. What happens during an annual well check? The services and screenings done by your health care provider during your annual well check will depend on your age, overall health, lifestyle risk factors, and family history of disease. Counseling  Your health  care provider may ask you questions about your: Alcohol use. Tobacco use. Drug use. Emotional well-being. Home and relationship well-being. Sexual activity. Eating habits. History of falls. Memory and ability to understand (cognition). Work and work Statistician. Reproductive health. Screening  You may have the following tests or measurements: Height, weight, and BMI. Blood pressure. Lipid and cholesterol levels. These may be checked every 5 years, or more frequently if you are over 24 years old. Skin check. Lung cancer screening. You may have this screening every year starting at age 51 if you have a 30-pack-year history of smoking and currently smoke or have quit within the past 15 years. Fecal occult blood test (FOBT) of the stool. You may have this test every year starting at age 104. Flexible sigmoidoscopy or colonoscopy. You may have a sigmoidoscopy every 5 years or a colonoscopy every 10 years starting at age 36. Hepatitis C blood test. Hepatitis B blood test. Sexually transmitted disease (STD) testing. Diabetes screening. This is done by checking your blood sugar (glucose) after you have not eaten for a while (fasting). You may have this done every 1-3 years. Bone density scan. This is done to screen for osteoporosis. You may have this done starting at age 39. Mammogram. This may be done every 1-2 years. Talk to your health care provider about how often you should have regular mammograms. Talk with your health care provider about your test results, treatment options, and if necessary, the need for more tests. Vaccines  Your health care provider may recommend certain vaccines, such as: Influenza vaccine. This is recommended every year. Tetanus, diphtheria, and acellular pertussis (Tdap, Td) vaccine. You may need a Td booster every 10 years. Zoster vaccine. You may need this after age 33. Pneumococcal 13-valent conjugate (PCV13) vaccine. One dose is recommended after age  70. Pneumococcal polysaccharide (PPSV23) vaccine. One dose is recommended after age 43. Talk to your health care provider about which screenings and vaccines you need and how often you need them. This information is not intended to replace advice given to you by your health care provider. Make sure you discuss any questions you have with your health care provider. Document Released: 06/28/2015 Document Revised: 02/19/2016 Document Reviewed: 04/02/2015 Elsevier Interactive Patient Education  2017 [employer] Prevention in the Home Falls can cause injuries. They can happen to people of all ages. There are many things you can do to make your home safe and to help prevent falls. What can I do on the outside of my home? Regularly fix the edges of walkways and driveways and fix any cracks. Remove anything that might make you trip as you walk through a door, such as a raised step or threshold. Trim any bushes or trees on the path to your home. Use bright outdoor lighting. Clear any walking paths of anything that might make someone trip, such as rocks or tools. Regularly check to see if handrails are loose or broken. Make sure that both sides of any steps have handrails. Any raised decks and porches should have guardrails on the edges. Have any leaves, snow, or ice cleared regularly. Use sand or salt on walking paths during winter. Clean up any spills in your garage right away. This includes oil or grease spills. What can I do in the bathroom? Use night lights. Install grab bars by the toilet and in the tub and shower. Do not use towel bars as grab bars. Use non-skid mats or decals in the tub or shower. If you need to sit down in the shower, use a plastic, non-slip stool. Keep the floor dry. Clean up any water that spills on the floor as soon as it happens. Remove soap buildup in the tub or shower regularly. Attach bath mats securely with double-sided non-slip rug tape. Do not have throw  rugs and other things on the floor that can make you trip. What can I do in the bedroom? Use night lights. Make sure that you have a light by your bed that is easy to reach. Do not use any sheets or blankets that are too big for your bed. They should not hang down onto the floor. Have a firm chair that has side arms. You can use this for support while you get dressed. Do not have throw rugs and other things on the floor that can make you trip. What can I do in the kitchen? Clean up any spills right away. Avoid walking on wet floors. Keep items that you use a lot in easy-to-reach places. If you need to reach something above you, use a strong step stool that has a grab bar. Keep electrical cords out of the way. Do not use floor polish or wax that makes floors slippery. If you must use wax, use non-skid floor wax. Do not have throw rugs and other things on the floor that can make you trip. What can I do with my stairs? Do not leave any items on the stairs. Make sure that there are  handrails on both sides of the stairs and use them. Fix handrails that are broken or loose. Make sure that handrails are as long as the stairways. Check any carpeting to make sure that it is firmly attached to the stairs. Fix any carpet that is loose or worn. Avoid having throw rugs at the top or bottom of the stairs. If you do have throw rugs, attach them to the floor with carpet tape. Make sure that you have a light switch at the top of the stairs and the bottom of the stairs. If you do not have them, ask someone to add them for you. What else can I do to help prevent falls? Wear shoes that: Do not have high heels. Have rubber bottoms. Are comfortable and fit you well. Are closed at the toe. Do not wear sandals. If you use a stepladder: Make sure that it is fully opened. Do not climb a closed stepladder. Make sure that both sides of the stepladder are locked into place. Ask someone to hold it for you, if  possible. Clearly mark and make sure that you can see: Any grab bars or handrails. First and last steps. Where the edge of each step is. Use tools that help you move around (mobility aids) if they are needed. These include: Canes. Walkers. Scooters. Crutches. Turn on the lights when you go into a dark area. Replace any light bulbs as soon as they burn out. Set up your furniture so you have a clear path. Avoid moving your furniture around. If any of your floors are uneven, fix them. If there are any pets around you, be aware of where they are. Review your medicines with your doctor. Some medicines can make you feel dizzy. This can increase your chance of falling. Ask your doctor what other things that you can do to help prevent falls. This information is not intended to replace advice given to you by your health care provider. Make sure you discuss any questions you have with your health care provider. Document Released: 03/28/2009 Document Revised: 11/07/2015 Document Reviewed: 07/06/2014 Elsevier Interactive Patient Education  2017 [employer].

## 2022-06-12 ENCOUNTER — Other Ambulatory Visit: Payer: Self-pay | Admitting: Family Medicine

## 2022-06-12 DIAGNOSIS — K219 Gastro-esophageal reflux disease without esophagitis: Secondary | ICD-10-CM

## 2022-06-12 DIAGNOSIS — I10 Essential (primary) hypertension: Secondary | ICD-10-CM

## 2022-07-02 ENCOUNTER — Ambulatory Visit
Admission: RE | Admit: 2022-07-02 | Discharge: 2022-07-02 | Disposition: A | Discharge status: Home / Self Care | Payer: 59 | Source: Ambulatory Visit | Attending: Family Medicine | Admitting: Family Medicine

## 2022-07-02 DIAGNOSIS — E2839 Other primary ovarian failure: Secondary | ICD-10-CM

## 2022-07-02 DIAGNOSIS — M85852 Other specified disorders of bone density and structure, left thigh: Secondary | ICD-10-CM | POA: Diagnosis not present

## 2022-07-24 ENCOUNTER — Other Ambulatory Visit: Payer: Self-pay | Admitting: Family Medicine

## 2022-07-24 DIAGNOSIS — E039 Hypothyroidism, unspecified: Secondary | ICD-10-CM

## 2022-07-28 ENCOUNTER — Other Ambulatory Visit (INDEPENDENT_AMBULATORY_CARE_PROVIDER_SITE_OTHER): Payer: 59

## 2022-07-28 DIAGNOSIS — E785 Hyperlipidemia, unspecified: Secondary | ICD-10-CM

## 2022-07-28 DIAGNOSIS — E039 Hypothyroidism, unspecified: Secondary | ICD-10-CM | POA: Diagnosis not present

## 2022-07-28 DIAGNOSIS — I10 Essential (primary) hypertension: Secondary | ICD-10-CM | POA: Diagnosis not present

## 2022-07-28 LAB — CBC WITH DIFFERENTIAL/PLATELET
Basophils Absolute: 0.1 10*3/uL (ref 0.0–0.1)
Basophils Relative: 0.7 % (ref 0.0–3.0)
Eosinophils Absolute: 0.2 10*3/uL (ref 0.0–0.7)
Eosinophils Relative: 2.5 % (ref 0.0–5.0)
HCT: 36.3 % (ref 36.0–46.0)
Hemoglobin: 11.9 g/dL — ABNORMAL LOW (ref 12.0–15.0)
Lymphocytes Relative: 25 % (ref 12.0–46.0)
Lymphs Abs: 2.1 10*3/uL (ref 0.7–4.0)
MCHC: 32.8 g/dL (ref 30.0–36.0)
MCV: 82 fl (ref 78.0–100.0)
Monocytes Absolute: 0.5 10*3/uL (ref 0.1–1.0)
Monocytes Relative: 5.3 % (ref 3.0–12.0)
Neutro Abs: 5.7 10*3/uL (ref 1.4–7.7)
Neutrophils Relative %: 66.5 % (ref 43.0–77.0)
Platelets: 225 10*3/uL (ref 150.0–400.0)
RBC: 4.43 Mil/uL (ref 3.87–5.11)
RDW: 16.4 % — ABNORMAL HIGH (ref 11.5–15.5)
WBC: 8.5 10*3/uL (ref 4.0–10.5)

## 2022-07-28 LAB — COMPREHENSIVE METABOLIC PANEL
ALT: 11 U/L (ref 0–35)
AST: 11 U/L (ref 0–37)
Albumin: 3.9 g/dL (ref 3.5–5.2)
Alkaline Phosphatase: 123 U/L — ABNORMAL HIGH (ref 39–117)
BUN: 16 mg/dL (ref 6–23)
CO2: 31 mEq/L (ref 19–32)
Calcium: 9.2 mg/dL (ref 8.4–10.5)
Chloride: 103 mEq/L (ref 96–112)
Creatinine, Ser: 0.46 mg/dL (ref 0.40–1.20)
GFR: 94.95 mL/min (ref >60.00)
Glucose, Bld: 112 mg/dL — ABNORMAL HIGH (ref 70–99)
Potassium: 4.3 mEq/L (ref 3.5–5.1)
Sodium: 142 mEq/L (ref 135–145)
Total Bilirubin: 0.8 mg/dL (ref 0.2–1.2)
Total Protein: 6.8 g/dL (ref 6.0–8.3)

## 2022-07-28 LAB — TSH: TSH: 4.44 u[IU]/mL (ref 0.35–5.50)

## 2022-07-28 LAB — LIPID PANEL
Cholesterol: 136 mg/dL (ref 0–200)
HDL: 38.1 mg/dL — ABNORMAL LOW (ref >39.00)
LDL Cholesterol: 69 mg/dL (ref 0–99)
NonHDL: 97.81
Total CHOL/HDL Ratio: 4
Triglycerides: 143 mg/dL (ref 0.0–149.0)
VLDL: 28.6 mg/dL (ref 0.0–40.0)

## 2022-07-29 ENCOUNTER — Encounter: Payer: Self-pay | Admitting: Family Medicine

## 2022-07-29 NOTE — Progress Notes (Signed)
Letter mailed

## 2022-08-12 ENCOUNTER — Other Ambulatory Visit: Payer: Self-pay | Admitting: Family Medicine

## 2022-08-12 DIAGNOSIS — E785 Hyperlipidemia, unspecified: Secondary | ICD-10-CM

## 2022-08-12 DIAGNOSIS — M792 Neuralgia and neuritis, unspecified: Secondary | ICD-10-CM

## 2022-08-24 ENCOUNTER — Encounter: Payer: Self-pay | Admitting: Family Medicine

## 2022-08-24 ENCOUNTER — Ambulatory Visit (INDEPENDENT_AMBULATORY_CARE_PROVIDER_SITE_OTHER): Payer: 59 | Admitting: Family Medicine

## 2022-08-24 VITALS — BP 124/78 | HR 85 | Resp 18 | Ht 60.0 in | Wt 240.0 lb

## 2022-08-24 DIAGNOSIS — M79604 Pain in right leg: Secondary | ICD-10-CM

## 2022-08-24 MED ORDER — PREDNISONE 20 MG PO TABS: 40.0000 mg | ORAL_TABLET | Freq: Every day | ORAL | 0 refills | Status: AC

## 2022-08-24 MED ORDER — MELOXICAM 15 MG PO TABS: 15.0000 mg | ORAL_TABLET | Freq: Every day | ORAL | 0 refills | Status: DC

## 2022-08-24 NOTE — Patient Instructions (Signed)
Prednisone for 5 days then switch to Meloxicam (do not take together or with any ibuprofen or Aleve). Rest, heat, massage, home exercises/stretches - handout provided.  If symptoms worsen or do not improve, follow-up.

## 2022-08-24 NOTE — Progress Notes (Signed)
Acute Office Visit  Subjective:     Patient ID: Kelly [name], female    DOB: 07-Dec-1950, 72 y.o.   MRN: [id number]  Chief Complaint  Patient presents with   Leg Pain    Right leg pain onset 08/19/2022 Pain from hip to leg  Can't walk much      Patient is in today for acute right leg pain.   Patient states that last Wednesday morning she woke up with a pain to her right posterior upper leg. States pain is shooting/stabbing, radiating from right glute to knee. Denies any history of back pain or sciatica. No known injuries/trama/triggers. States pain can get up to 10/10 and seems to be worse with bending to change positions. She uses walker/wheelchair at baseline. Denies any skin changes - states her [facility] CMA checked as well. No numbness/tingling. She has not found anything that helps with the discomfort - ibuprofen does not do anything.        ROS All review of systems negative except what is listed in the HPI      Objective:    BP 124/78 (BP Location: Left Arm, Patient Position: Sitting, Cuff Size: Normal)   Pulse 85   Resp 18   Ht 5' (1.524 m)   Wt 240 lb (108.9 kg)   SpO2 97%   BMI 46.87 kg/m    Physical Exam Vitals reviewed.  Constitutional:      Appearance: Normal appearance. She is obese.  Musculoskeletal:        General: No swelling or deformity.     Comments: Tenderness to palpation of right SI and deep glute  Skin:    General: Skin is warm and dry.     Findings: No bruising, erythema, lesion or rash.  Neurological:     General: No focal deficit present.     Mental Status: She is alert and oriented to person, place, and time. Mental status is at baseline.  Psychiatric:        Mood and Affect: Mood normal.        Behavior: Behavior normal.        Thought Content: Thought content normal.        Judgment: Judgment normal.     No results found for any visits on 08/24/22.      Assessment & Plan:   Problem List Items Addressed This Visit    None Visit Diagnoses     Acute leg pain, right    -  Primary Consider sciatica given location and characteristics. Some discomfort to SI joint palpation also.  Prednisone for 5 days then switch to Meloxicam (do not take together or with any ibuprofen or Aleve). Rest, heat, massage, home exercises/stretches - handout provided. Declined PT referral at this time. If symptoms worsen or do not improve, follow-up.      Relevant Medications   predniSONE (DELTASONE) 20 MG tablet   meloxicam (MOBIC) 15 MG tablet       Meds ordered this encounter  Medications   predniSONE (DELTASONE) 20 MG tablet    Sig: Take 2 tablets (40 mg total) by mouth daily with breakfast for 5 days.    Dispense:  10 tablet    Refill:  0    Order Specific Question:   Supervising Provider    Answer:   Penni Homans A [[id number]]   meloxicam (MOBIC) 15 MG tablet    Sig: Take 1 tablet (15 mg total) by mouth daily.    Dispense:  30  tablet    Refill:  0    Order Specific Question:   Supervising Provider    Answer:   Penni Homans A [[id number]]    Return if symptoms worsen or fail to improve.  Terrilyn Saver, NP

## 2022-09-24 ENCOUNTER — Other Ambulatory Visit: Payer: Self-pay | Admitting: Family Medicine

## 2022-09-24 DIAGNOSIS — I10 Essential (primary) hypertension: Secondary | ICD-10-CM

## 2022-12-03 ENCOUNTER — Other Ambulatory Visit: Payer: Self-pay | Admitting: Family Medicine

## 2022-12-03 DIAGNOSIS — Z1231 Encounter for screening mammogram for malignant neoplasm of breast: Secondary | ICD-10-CM

## 2022-12-08 ENCOUNTER — Other Ambulatory Visit: Payer: Self-pay | Admitting: Family Medicine

## 2022-12-08 DIAGNOSIS — K219 Gastro-esophageal reflux disease without esophagitis: Secondary | ICD-10-CM

## 2022-12-08 DIAGNOSIS — I10 Essential (primary) hypertension: Secondary | ICD-10-CM

## 2023-01-07 DIAGNOSIS — I69959 Hemiplegia and hemiparesis following unspecified cerebrovascular disease affecting unspecified side: Secondary | ICD-10-CM | POA: Diagnosis not present

## 2023-01-11 ENCOUNTER — Encounter: Payer: Self-pay | Admitting: Family Medicine

## 2023-01-11 ENCOUNTER — Ambulatory Visit (INDEPENDENT_AMBULATORY_CARE_PROVIDER_SITE_OTHER): Payer: 59 | Admitting: Family Medicine

## 2023-01-11 VITALS — BP 114/70 | HR 73 | Temp 98.2°F | Resp 18 | Ht 60.0 in

## 2023-01-11 DIAGNOSIS — G8194 Hemiplegia, unspecified affecting left nondominant side: Secondary | ICD-10-CM | POA: Diagnosis not present

## 2023-01-11 DIAGNOSIS — R6889 Other general symptoms and signs: Secondary | ICD-10-CM

## 2023-01-11 DIAGNOSIS — M79661 Pain in right lower leg: Secondary | ICD-10-CM | POA: Diagnosis not present

## 2023-01-11 DIAGNOSIS — D229 Melanocytic nevi, unspecified: Secondary | ICD-10-CM

## 2023-01-11 DIAGNOSIS — R252 Cramp and spasm: Secondary | ICD-10-CM | POA: Diagnosis not present

## 2023-01-11 DIAGNOSIS — E785 Hyperlipidemia, unspecified: Secondary | ICD-10-CM | POA: Diagnosis not present

## 2023-01-11 DIAGNOSIS — E039 Hypothyroidism, unspecified: Secondary | ICD-10-CM

## 2023-01-11 DIAGNOSIS — M79604 Pain in right leg: Secondary | ICD-10-CM

## 2023-01-11 DIAGNOSIS — I10 Essential (primary) hypertension: Secondary | ICD-10-CM

## 2023-01-11 DIAGNOSIS — R200 Anesthesia of skin: Secondary | ICD-10-CM

## 2023-01-11 NOTE — Assessment & Plan Note (Signed)
CHECK LABS
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
## 2023-01-11 NOTE — Assessment & Plan Note (Signed)
STABLE

## 2023-01-11 NOTE — Assessment & Plan Note (Signed)
Well controlled, no changes to meds. Encouraged heart healthy diet such as the DASH diet and exercise as tolerated.  °

## 2023-01-11 NOTE — Assessment & Plan Note (Signed)
ON BACK --- IRREGULAR AND BLACK

## 2023-01-11 NOTE — Assessment & Plan Note (Signed)
Encourage heart healthy diet such as MIND or DASH diet, increase exercise, avoid trans fats, simple carbohydrates and processed foods, consider a krill or fish or flaxseed oil cap daily.  °

## 2023-01-11 NOTE — Assessment & Plan Note (Signed)
COMES AND GOES --- ART DOPPLER

## 2023-01-13 ENCOUNTER — Encounter: Payer: Self-pay | Admitting: Family Medicine

## 2023-01-15 ENCOUNTER — Ambulatory Visit
Admission: RE | Admit: 2023-01-15 | Discharge: 2023-01-15 | Disposition: A | Discharge status: Home / Self Care | Payer: 59 | Source: Ambulatory Visit | Attending: Family Medicine | Admitting: Family Medicine

## 2023-01-15 DIAGNOSIS — Z1231 Encounter for screening mammogram for malignant neoplasm of breast: Secondary | ICD-10-CM | POA: Diagnosis not present

## 2023-01-19 ENCOUNTER — Other Ambulatory Visit: Payer: Self-pay | Admitting: Family Medicine

## 2023-01-19 DIAGNOSIS — E039 Hypothyroidism, unspecified: Secondary | ICD-10-CM

## 2023-01-26 ENCOUNTER — Other Ambulatory Visit (HOSPITAL_COMMUNITY): Payer: Self-pay

## 2023-01-26 DIAGNOSIS — M79641 Pain in right hand: Secondary | ICD-10-CM | POA: Diagnosis not present

## 2023-01-26 DIAGNOSIS — M25572 Pain in left ankle and joints of left foot: Secondary | ICD-10-CM | POA: Diagnosis not present

## 2023-01-28 DIAGNOSIS — I69959 Hemiplegia and hemiparesis following unspecified cerebrovascular disease affecting unspecified side: Secondary | ICD-10-CM | POA: Diagnosis not present

## 2023-02-11 DIAGNOSIS — I69959 Hemiplegia and hemiparesis following unspecified cerebrovascular disease affecting unspecified side: Secondary | ICD-10-CM | POA: Diagnosis not present

## 2023-02-18 ENCOUNTER — Ambulatory Visit (INDEPENDENT_AMBULATORY_CARE_PROVIDER_SITE_OTHER): Payer: 59

## 2023-02-18 ENCOUNTER — Encounter: Payer: Self-pay | Admitting: Podiatry

## 2023-02-18 ENCOUNTER — Ambulatory Visit (INDEPENDENT_AMBULATORY_CARE_PROVIDER_SITE_OTHER): Payer: 59 | Admitting: Podiatry

## 2023-02-18 DIAGNOSIS — M7752 Other enthesopathy of left foot: Secondary | ICD-10-CM

## 2023-02-18 DIAGNOSIS — R600 Localized edema: Secondary | ICD-10-CM | POA: Diagnosis not present

## 2023-02-18 MED ORDER — TRIAMCINOLONE ACETONIDE 10 MG/ML IJ SUSP
10.0000 mg | Freq: Once | INTRAMUSCULAR | Status: AC
Start: 2023-02-18 — End: 2023-02-18
  Administered 2023-02-18: 10 mg via INTRA_ARTICULAR

## 2023-02-18 NOTE — Progress Notes (Signed)
Subjective:   Patient ID: Kelly [name], female   DOB: 72 y.o.   MRN: [id number]   HPI Patient presents in wheelchair with history of lack of function left side with swelling in the left foot ankle history of injury a month ago inflammation of the ankle very painful with swelling.  Patient no other change in health history     ROS      Objective:  Physical Exam  Neurovascular status intact edema +2 pitting left foot ankle negative Homans' sign noted discomfort into the left sinus tarsi fluid buildup     Assessment:  Appears to be inflammatory process capsulitis of the ankle joint left     Plan:  H&P reviewed all conditions and I went ahead today reviewed x-ray then did sterile prep injected the subtalar joint 3 mg Kenalog 5 mg Xylocaine applied Unna boot Ace wrap to completely compress the foot and ankle instructed on leaving it on 3 days but take it off earlier if any swelling to the toes were to occur and reappoint as symptoms indicate  X-rays were negative for signs of fracture of the ankle or foot left

## 2023-02-23 ENCOUNTER — Ambulatory Visit (INDEPENDENT_AMBULATORY_CARE_PROVIDER_SITE_OTHER): Payer: 59 | Admitting: Dermatology

## 2023-02-23 ENCOUNTER — Encounter: Payer: Self-pay | Admitting: Dermatology

## 2023-02-23 DIAGNOSIS — L598 Other specified disorders of the skin and subcutaneous tissue related to radiation: Secondary | ICD-10-CM | POA: Diagnosis not present

## 2023-02-23 DIAGNOSIS — L821 Other seborrheic keratosis: Secondary | ICD-10-CM

## 2023-02-23 DIAGNOSIS — L738 Other specified follicular disorders: Secondary | ICD-10-CM | POA: Diagnosis not present

## 2023-02-23 DIAGNOSIS — D229 Melanocytic nevi, unspecified: Secondary | ICD-10-CM

## 2023-02-23 DIAGNOSIS — L818 Other specified disorders of pigmentation: Secondary | ICD-10-CM

## 2023-02-23 DIAGNOSIS — L814 Other melanin hyperpigmentation: Secondary | ICD-10-CM | POA: Diagnosis not present

## 2023-02-23 DIAGNOSIS — D485 Neoplasm of uncertain behavior of skin: Secondary | ICD-10-CM

## 2023-02-23 NOTE — Progress Notes (Unsigned)
   New Patient Visit   Subjective  Kelly [name] is a 72 y.o. female who presents for the following: growth on the back  Pt has a growth on her upper back that is itchy. Her pcp saw it and wanted it evaluated. Pt doesn't know how long it's been there as she can't see it. She has no hx of skin cancer and no family hx of skin cancer. Pt has had breast cancer. Pt is in a wheelchair.  The patient has spots, moles and lesions to be evaluated, some may be new or changing and the patient may have concern these could be cancer.  The following portions of the chart were reviewed this encounter and updated as appropriate: medications, allergies, medical history  Review of Systems:  No other skin or systemic complaints except as noted in HPI or Assessment and Plan.  Objective  Well appearing patient in no apparent distress; mood and affect are within normal limits.   A focused examination was performed of the following areas: Upper body  Relevant exam findings are noted in the Assessment and Plan.  Right Upper Back Irregular brown macule         Assessment & Plan   SEBORRHEIC KERATOSIS - Stuck-on, waxy, tan-brown papules and/or plaques  - Benign-appearing - Discussed benign etiology and prognosis. - Observe - Call for any changes  LENTIGINES Exam: scattered tan macules Due to sun exposure Treatment Plan: Benign-appearing, observe. Recommend daily broad spectrum sunscreen SPF 30+ to sun-exposed areas, reapply every 2 hours as needed.  Call for any changes   Radiation Tattoo Exam: dark spot from radiation on mid chest - Discussed that lesion is from radiation from breast cancer  Sebaceous Hyperplasia - Small yellow papules with a central dell on central face - Benign-appearing - Observe. Call for changes.   MELANOCYTIC NEVI Exam: Tan-brown and/or pink-flesh-colored symmetric macules and papules  Treatment Plan: Benign appearing on exam today. Recommend  observation. Call clinic for new or changing moles. Recommend daily use of broad spectrum spf 30+ sunscreen to sun-exposed areas.     Neoplasm of uncertain behavior of skin Right Upper Back  Skin / nail biopsy Type of biopsy: tangential   Timeout: patient name, date of birth, surgical site, and procedure verified   Instrument used: DermaBlade   Hemostasis achieved with: pressure and aluminum chloride   Outcome: patient tolerated procedure well   Post-procedure details: sterile dressing applied and wound care instructions given   Dressing type: bandage and petrolatum    Specimen 1 - Surgical pathology Differential Diagnosis: R/O SK vs MM  Check Margins: No    Return if symptoms worsen or fail to improve.  I, Tillie Fantasia, CMA, am acting as scribe for Gwenith Daily, MD.   Documentation: I have reviewed the above documentation for accuracy and completeness, and I agree with the above.  Gwenith Daily, MD

## 2023-02-23 NOTE — Patient Instructions (Addendum)

## 2023-02-24 NOTE — Addendum Note (Signed)
Addended by: Gwenith Daily on: 02/24/2023 12:39 PM   Modules accepted: Level of Service

## 2023-02-25 DIAGNOSIS — G5601 Carpal tunnel syndrome, right upper limb: Secondary | ICD-10-CM | POA: Diagnosis not present

## 2023-02-25 LAB — SURGICAL PATHOLOGY

## 2023-03-02 ENCOUNTER — Telehealth: Payer: Self-pay

## 2023-03-02 NOTE — Telephone Encounter (Signed)
-----   Message from [facility] PACI sent at 03/02/2023  1:19 PM EDT ----- Results: Lesion on right upper back results showed SK. Please reassure patient on benign nature. OK to follow up in 6-12 months  Communication: MyChart

## 2023-03-02 NOTE — Telephone Encounter (Signed)
L/m for pt that spot removed was a sk and no further treatment needed

## 2023-03-08 ENCOUNTER — Other Ambulatory Visit: Payer: Self-pay | Admitting: Family Medicine

## 2023-03-08 DIAGNOSIS — K219 Gastro-esophageal reflux disease without esophagitis: Secondary | ICD-10-CM

## 2023-03-08 DIAGNOSIS — I10 Essential (primary) hypertension: Secondary | ICD-10-CM

## 2023-03-18 DIAGNOSIS — I69959 Hemiplegia and hemiparesis following unspecified cerebrovascular disease affecting unspecified side: Secondary | ICD-10-CM | POA: Diagnosis not present

## 2023-03-23 ENCOUNTER — Other Ambulatory Visit: Payer: Self-pay | Admitting: Family Medicine

## 2023-03-23 DIAGNOSIS — I10 Essential (primary) hypertension: Secondary | ICD-10-CM

## 2023-03-24 ENCOUNTER — Other Ambulatory Visit: Payer: Self-pay | Admitting: Family Medicine

## 2023-03-24 ENCOUNTER — Telehealth: Payer: Self-pay | Admitting: Family Medicine

## 2023-03-24 MED ORDER — IBUPROFEN 600 MG PO TABS: 600.0000 mg | ORAL_TABLET | Freq: Four times a day (QID) | ORAL | 1 refills | Status: DC | PRN

## 2023-03-24 NOTE — Telephone Encounter (Signed)
Prescription Request  03/24/2023  Is this a "Controlled Substance" medicine? No  LOV: 01/11/2023  What is the name of the medication or equipment?   ibuprofen (ADVIL) 600 MG tablet [[id number]]  Have you contacted your pharmacy to request a refill? No   Which pharmacy would you like this sent to?  [facility] - [place], [place] - [address] [place] [place] [address] Phone: [phone] Fax: [phone]    Patient notified that their request is being sent to the clinical staff for review and that they should receive a response within 2 business days.   Please advise at Mobile [phone] (mobile)

## 2023-03-24 NOTE — Telephone Encounter (Signed)
Rx not filled since 2023. Okay for patient to be taking?? Okay to refill??

## 2023-04-16 ENCOUNTER — Ambulatory Visit (INDEPENDENT_AMBULATORY_CARE_PROVIDER_SITE_OTHER): Payer: 59 | Admitting: Family Medicine

## 2023-04-16 ENCOUNTER — Encounter: Payer: Self-pay | Admitting: Family Medicine

## 2023-04-16 VITALS — BP 118/70 | HR 96 | Temp 98.2°F | Resp 18 | Ht 60.0 in

## 2023-04-16 DIAGNOSIS — Z0000 Encounter for general adult medical examination without abnormal findings: Secondary | ICD-10-CM | POA: Diagnosis not present

## 2023-04-16 DIAGNOSIS — G8194 Hemiplegia, unspecified affecting left nondominant side: Secondary | ICD-10-CM

## 2023-04-16 DIAGNOSIS — I10 Essential (primary) hypertension: Secondary | ICD-10-CM

## 2023-04-16 DIAGNOSIS — E039 Hypothyroidism, unspecified: Secondary | ICD-10-CM

## 2023-04-16 DIAGNOSIS — E785 Hyperlipidemia, unspecified: Secondary | ICD-10-CM | POA: Diagnosis not present

## 2023-04-16 LAB — CBC WITH DIFFERENTIAL/PLATELET
Basophils Absolute: 0 10*3/uL (ref 0.0–0.1)
Basophils Relative: 0.3 % (ref 0.0–3.0)
Eosinophils Absolute: 0.2 10*3/uL (ref 0.0–0.7)
Eosinophils Relative: 1.8 % (ref 0.0–5.0)
HCT: 38.2 % (ref 36.0–46.0)
Hemoglobin: 11.9 g/dL — ABNORMAL LOW (ref 12.0–15.0)
Lymphocytes Relative: 21.9 % (ref 12.0–46.0)
Lymphs Abs: 2 10*3/uL (ref 0.7–4.0)
MCHC: 31.2 g/dL (ref 30.0–36.0)
MCV: 86 fL (ref 78.0–100.0)
Monocytes Absolute: 0.5 10*3/uL (ref 0.1–1.0)
Monocytes Relative: 5.3 % (ref 3.0–12.0)
Neutro Abs: 6.4 10*3/uL (ref 1.4–7.7)
Neutrophils Relative %: 70.7 % (ref 43.0–77.0)
Platelets: 229 10*3/uL (ref 150.0–400.0)
RBC: 4.44 Mil/uL (ref 3.87–5.11)
RDW: 17.2 % — ABNORMAL HIGH (ref 11.5–15.5)
WBC: 9.1 10*3/uL (ref 4.0–10.5)

## 2023-04-16 LAB — COMPREHENSIVE METABOLIC PANEL
ALT: 13 U/L (ref 0–35)
AST: 13 U/L (ref 0–37)
Albumin: 4.1 g/dL (ref 3.5–5.2)
Alkaline Phosphatase: 124 U/L — ABNORMAL HIGH (ref 39–117)
BUN: 22 mg/dL (ref 6–23)
CO2: 32 meq/L (ref 19–32)
Calcium: 9.2 mg/dL (ref 8.4–10.5)
Chloride: 101 meq/L (ref 96–112)
Creatinine, Ser: 0.48 mg/dL (ref 0.40–1.20)
GFR: 93.51 mL/min (ref >60.00)
Glucose, Bld: 118 mg/dL — ABNORMAL HIGH (ref 70–99)
Potassium: 4.1 meq/L (ref 3.5–5.1)
Sodium: 143 meq/L (ref 135–145)
Total Bilirubin: 0.9 mg/dL (ref 0.2–1.2)
Total Protein: 7 g/dL (ref 6.0–8.3)

## 2023-04-16 LAB — LIPID PANEL
Cholesterol: 140 mg/dL (ref 0–200)
HDL: 42.7 mg/dL (ref >39.00)
LDL Cholesterol: 69 mg/dL (ref 0–99)
NonHDL: 97.33
Total CHOL/HDL Ratio: 3
Triglycerides: 142 mg/dL (ref 0.0–149.0)
VLDL: 28.4 mg/dL (ref 0.0–40.0)

## 2023-04-16 LAB — TSH: TSH: 4.84 u[IU]/mL (ref 0.35–5.50)

## 2023-04-16 NOTE — Assessment & Plan Note (Signed)
Ghm utd Check labs  See AV[facility] Health Maintenance  Topic Date Due   COVID-19 Vaccine (4 - 2023-24 season) 02/14/2023   Medicare Annual Wellness (AWV)  04/17/2023   DTaP/Tdap/Td (5 - Td or Tdap) 10/30/2023   DEXA SCAN  07/02/2024   MAMMOGRAM  01/14/2025   Colonoscopy  01/04/2031   Pneumonia Vaccine 64+ Years old  Completed   INFLUENZA VACCINE  Completed   Hepatitis C Screening  Completed   Zoster Vaccines- Shingrix  Completed   HPV VACCINES  Aged Out

## 2023-04-16 NOTE — Assessment & Plan Note (Signed)
Check labs  Con't synthroid 
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
## 2023-04-16 NOTE — Assessment & Plan Note (Signed)
Well controlled, no changes to meds. Encouraged heart healthy diet such as the DASH diet and exercise as tolerated.  °

## 2023-04-16 NOTE — Patient Instructions (Signed)
Preventive Care 65 Years and Older, Female Preventive care refers to lifestyle choices and visits with your health care provider that can promote health and wellness. Preventive care visits are also called wellness exams. What can I expect for my preventive care visit? Counseling Your health care provider may ask you questions about your: Medical history, including: Past medical problems. Family medical history. Pregnancy and menstrual history. History of falls. Current health, including: Memory and ability to understand (cognition). Emotional well-being. Home life and relationship well-being. Sexual activity and sexual health. Lifestyle, including: Alcohol, nicotine or tobacco, and drug use. Access to firearms. Diet, exercise, and sleep habits. Work and work environment. Sunscreen use. Safety issues such as seatbelt and bike helmet use. Physical exam Your health care provider will check your: Height and weight. These may be used to calculate your BMI (body mass index). BMI is a measurement that tells if you are at a healthy weight. Waist circumference. This measures the distance around your waistline. This measurement also tells if you are at a healthy weight and may help predict your risk of certain diseases, such as type 2 diabetes and high blood pressure. Heart rate and blood pressure. Body temperature. Skin for abnormal spots. What immunizations do I need?  Vaccines are usually given at various ages, according to a schedule. Your health care provider will recommend vaccines for you based on your age, medical history, and lifestyle or other factors, such as travel or where you work. What tests do I need? Screening Your health care provider may recommend screening tests for certain conditions. This may include: Lipid and cholesterol levels. Hepatitis C test. Hepatitis B test. HIV (human immunodeficiency virus) test. STI (sexually transmitted infection) testing, if you are at  risk. Lung cancer screening. Colorectal cancer screening. Diabetes screening. This is done by checking your blood sugar (glucose) after you have not eaten for a while (fasting). Mammogram. Talk with your health care provider about how often you should have regular mammograms. BRCA-related cancer screening. This may be done if you have a family history of breast, ovarian, tubal, or peritoneal cancers. Bone density scan. This is done to screen for osteoporosis. Talk with your health care provider about your test results, treatment options, and if necessary, the need for more tests. Follow these instructions at home: Eating and drinking  Eat a diet that includes fresh fruits and vegetables, whole grains, lean protein, and low-fat dairy products. Limit your intake of foods with high amounts of sugar, saturated fats, and salt. Take vitamin and mineral supplements as recommended by your health care provider. Do not drink alcohol if your health care provider tells you not to drink. If you drink alcohol: Limit how much you have to 0-1 drink a day. Know how much alcohol is in your drink. In the [place] one drink equals one 12 oz bottle of beer (355 mL), one 5 oz glass of wine (148 mL), or one 1 oz glass of hard liquor (44 mL). Lifestyle Brush your teeth every morning and night with fluoride toothpaste. Floss one time each day. Exercise for at least 30 minutes 5 or more days each week. Do not use any products that contain nicotine or tobacco. These products include cigarettes, chewing tobacco, and vaping devices, such as e-cigarettes. If you need help quitting, ask your health care provider. Do not use drugs. If you are sexually active, practice safe sex. Use a condom or other form of protection in order to prevent STIs. Take aspirin only as told by   your health care provider. Make sure that you understand how much to take and what form to take. Work with your health care provider to find out whether it  is safe and beneficial for you to take aspirin daily. Ask your health care provider if you need to take a cholesterol-lowering medicine (statin). Find healthy ways to manage stress, such as: Meditation, yoga, or listening to music. Journaling. Talking to a trusted person. Spending time with friends and family. Minimize exposure to UV radiation to reduce your risk of skin cancer. Safety Always wear your seat belt while driving or riding in a vehicle. Do not drive: If you have been drinking alcohol. Do not ride with someone who has been drinking. When you are tired or distracted. While texting. If you have been using any mind-altering substances or drugs. Wear a helmet and other protective equipment during sports activities. If you have firearms in your house, make sure you follow all gun safety procedures. What's next? Visit your health care provider once a year for an annual wellness visit. Ask your health care provider how often you should have your eyes and teeth checked. Stay up to date on all vaccines. This information is not intended to replace advice given to you by your health care provider. Make sure you discuss any questions you have with your health care provider. Document Revised: 11/27/2020 Document Reviewed: 11/27/2020 Elsevier Patient Education  2024 [employer].  

## 2023-04-16 NOTE — Assessment & Plan Note (Signed)
Weakness in L leg worsening

## 2023-04-16 NOTE — Assessment & Plan Note (Signed)
Pt is steadily losing weight with diet

## 2023-04-18 ENCOUNTER — Encounter: Payer: Self-pay | Admitting: Family Medicine

## 2023-04-19 NOTE — Progress Notes (Signed)
Letter mailed out. Done 

## 2023-04-22 ENCOUNTER — Telehealth: Payer: Self-pay

## 2023-04-22 NOTE — Telephone Encounter (Signed)
Unsuccessful attempts to reach patient on preferred number listed in notes for scheduled AWV. Left message on voicemail okay to reschedule.

## 2023-05-05 ENCOUNTER — Telehealth: Payer: Self-pay | Admitting: Family Medicine

## 2023-05-05 NOTE — Telephone Encounter (Signed)
Patient called to say that she just spoke to the company who would be in charge of getting her a replacement power wheelchair but they have not received the order and she knows provider filled it out. Please refax to [phone]. Pt did not remember name of the company. She would like a call back when it has been faxed to them so she can follow up. She was supposed to get her chair next month.

## 2023-05-06 NOTE — Telephone Encounter (Signed)
Pt called back. Please resend.

## 2023-05-07 NOTE — Telephone Encounter (Signed)
Form faxed again.

## 2023-05-07 NOTE — Telephone Encounter (Signed)
Please see note before, Patient called and is needing the info faxed for her wheelchair

## 2023-05-25 ENCOUNTER — Ambulatory Visit: Payer: 59 | Attending: Family Medicine | Admitting: Physical Therapy

## 2023-05-25 ENCOUNTER — Encounter: Payer: Self-pay | Admitting: Physical Therapy

## 2023-05-25 DIAGNOSIS — M6281 Muscle weakness (generalized): Secondary | ICD-10-CM | POA: Diagnosis not present

## 2023-05-25 DIAGNOSIS — R29818 Other symptoms and signs involving the nervous system: Secondary | ICD-10-CM | POA: Insufficient documentation

## 2023-05-25 DIAGNOSIS — R2689 Other abnormalities of gait and mobility: Secondary | ICD-10-CM | POA: Diagnosis not present

## 2023-05-25 NOTE — Therapy (Signed)
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                                                 []  Ankle strap/heel loops [] support foot on foot support [] decrease extraneous movement [] provide input to heel  [] protect foot  Tires: [] pneumatic  [x] flat free inserts  [] solid  [x] decrease maintenance  [x] prevent frequent flats [] increase shock absorbency [] decrease pain from  road shock [] decrease spasms from road shock []                                              [x]  Headrest  [] provide posterior head support [] provide posterior neck support [] provide lateral head support [] provide anterior head support [x] support during tilt and recline [] improve feeding   [] improve respiration [] placement of switches [x] safety  [] accommodate  ROM  [] accommodate tone [] improve visual orientation  []  Anterior chest strap []  Vest []  Shoulder retractors  [] decrease forward movement of shoulder [] accommodation of TLSO [] decrease forward movement of trunk [] decrease shoulder elevation [] added abdominal support [] alignment [] assistance with shoulder control  []                                               Pelvic Positioner [x] Belt [] SubASIS bar [] Dual Pull [] stabilize tone [x] decrease falling out of chair/ **will not Decrease potential for sliding due to pelvic tilting [] prevent excessive rotation [] pad for protection over boney prominence [] prominence comfort [] special pull angle to control rotation []                                                  Upper ExtremitySupport  [] L   []  R [] Arm trough   [] hand support []  tray       [] full tray [] swivel mount [] decrease edema      [] decrease subluxation   [] control tone   [] placement for AAC/Computer/EADL [] decrease gravitational pull on shoulders [] provide midline positioning [] provide support to increase UE function [] provide hand support in natural position [] provide work surface   POWER WHEELCHAIR CONTROLS  [x] Proportional  [] Non-Proportional Type                                      [] Left  [x] Right [x] provides access for controlling wheelchair   [] lacks motor control to operate proportional drive control [] unable to understand proportional controls  Actuator Control Module  [] Single  [x] Multiple   [x] Allow the client to operate the power seat function(s) through the joystick control   [] Safety Reset Switches [] Used to change modes and stop the wheelchair when driving in latch mode    [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   [] programming for accurate control [] progressive Disease/changing condition [] non-proportional drive control needed [x] Needed in order to operate power seat functions through joystick control   [] Display box [] Allows user to see in which mode and drive  the wheelchair is set  [] necessary for alternate controls    [] Digital interface electronics [] Allows w/c to operate when using alternative drive controls  [] ASL Head Array [] Allows client to operate wheelchair  through switches placed in tri-panel headrest  [] Sip and puff with tubing kit [] needed to operate sip and puff drive controls  [] Upgraded tracking electronics [] increase safety when driving [] correct tracking when on uneven surfaces  [x] Mount for switches or joystick [x] Attaches switches to w/c  [] Swing away for access or transfers [] midline for optimal placement [] provides for consistent access  [] Attendant controlled joystick plus mount [] safety [] long distance driving [] operation of seat functions [] compliance with transportation  regulations []                                             Rear wheel placement/Axle adjustability [] None [] semi adjustable [] fully adjustable  [] improved UE access to wheels [] improved stability [] changing angle in space for improvement of postural stability [] 1-arm drive access [] amputee pad placement []                                Wheel rims/ hand rims  [] metal   [] plastic coated [] oblique projections           [] vertical projections [] Provide ability to propel manual wheelchair  []  Increase self-propulsion with hand weakness/decreased grasp  Push handles [] extended   [] angle adjustable              [] standard [] caregiver access [] caregiver assist [] allows "hooking" to enable increased ability to perform ADLs or maintain balance  One armed device   [] Lt   [] Rt [] enable propulsion of manual wheelchair with one arm   []                                            Brake/wheel lock extension []  Lt   []  Rt [] increase indep in applying wheel locks   [] Side guards [] prevent clothing getting caught in wheel or becoming soiled []  prevent skin tears/abrasions  Battery:     group 24 x 2                                        [x] to power wheelchair                                                          Other:    Power seat elevator                                                                                                                     The above equipment has a life- long use expectancy. Growth and changes in medical and/or functional conditions would be the exceptions. This is to certify that the therapist has no financial relationship with durable medical provider or manufacturer. The therapist will not receive remuneration of any kind for the equipment recommended in this evaluation.   Patient has mobility limitation that significantly impairs safe, timely participation in one or more mobility related ADL's. (bathing, toileting, feeding, dressing, grooming, moving from room to room)  [x]  Yes []  No  Will mobility device sufficiently improve ability to participate and/or be aided in participation of MRADL's?      [x]  Yes []  No  Can limitation be compensated for with use of a cane or walker?                                    []  Yes [x]  No  Does patient or caregiver demonstrate ability/potential ability & willingness to safely use the mobility device?    [x]  Yes []  No  Does patient's home environment support use of recommended mobility device?            [x]  Yes []  No  Does patient have sufficient upper extremity function necessary to functionally propel a manual wheelchair?     []  Yes [x]  No  Does patient have sufficient strength and trunk stability to safely operate a POV (scooter)?                                  []  Yes [x]  No  Does patient need additional features/benefits provided by a power wheelchair for MRADL's in the home?        [x]  Yes []  No  Does the patient demonstrate the ability to safely use a power wheelchair?                   [x]  Yes []  No     Physician's Name Printed:      Seabron Spates, DO                                                  Physician's Signature:  Date:     This is to certify that  I, the above signed therapist have the following affiliations: []  This DME provider []  Manufacturer of recommended equipment []  Patient's long term care facility [x]  None of the above  Therapist Name/Signature:  Kerry Fort, PT                                        Date:  05-25-23  ASSESSMENT:  CLINICAL IMPRESSION: Patient is a 72 y.o. lady who was seen today for physical therapy evaluation and treatment for power wheelchair with recommendation for power seat elevator.   PLAN:  PT FREQUENCY: 1x/week  PT DURATION: 1 week  PLANNED INTERVENTIONS:  initial evaluation only for power wheelchair .  PLAN FOR NEXT SESSION: N/A - eval only   Christie, [place]ell, PT 05/25/2023, 12:43 PM

## 2023-06-04 ENCOUNTER — Other Ambulatory Visit: Payer: Self-pay | Admitting: Family Medicine

## 2023-06-04 DIAGNOSIS — I10 Essential (primary) hypertension: Secondary | ICD-10-CM

## 2023-06-04 DIAGNOSIS — K219 Gastro-esophageal reflux disease without esophagitis: Secondary | ICD-10-CM

## 2023-06-29 ENCOUNTER — Ambulatory Visit (INDEPENDENT_AMBULATORY_CARE_PROVIDER_SITE_OTHER): Payer: 59

## 2023-06-29 DIAGNOSIS — Z0000 Encounter for general adult medical examination without abnormal findings: Secondary | ICD-10-CM
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
## 2023-06-29 NOTE — Patient Instructions (Signed)
 Kelly [name] , Thank you for taking time to come for your Medicare Wellness Visit. I appreciate your ongoing commitment to your health goals. Please review the following plan we discussed and let me know if I can assist you in the future.     This is a list of the screening recommended for you and due dates:  Health Maintenance  Topic Date Due   COVID-19 Vaccine (4 - 2024-25 season) 02/14/2023   DTaP/Tdap/Td vaccine (5 - Td or Tdap) 10/30/2023   Medicare Annual Wellness Visit  06/28/2024   DEXA scan (bone density measurement)  07/02/2024   Mammogram  01/14/2025   Colon Cancer Screening  01/04/2031   Pneumonia Vaccine  Completed   Flu Shot  Completed   Hepatitis C Screening  Completed   Zoster (Shingles) Vaccine  Completed   HPV Vaccine  Aged Out    Next appointment: Follow up in one year for your annual wellness visit.   Preventive Care 75 Years and Older, Female Preventive care refers to lifestyle choices and visits with your health care provider that can promote health and wellness. What does preventive care include? A yearly physical exam. This is also called an annual well check. Dental exams once or twice a year. Routine eye exams. Ask your health care provider how often you should have your eyes checked. Personal lifestyle choices, including: Daily care of your teeth and gums. Regular physical activity. Eating a healthy diet. Avoiding tobacco and drug use. Limiting alcohol use. Practicing safe sex. Taking low-dose aspirin  every day. Taking vitamin and mineral supplements as recommended by your health care provider. What happens during an annual well check? The services and screenings done by your health care provider during your annual well check will depend on your age, overall health, lifestyle risk factors, and family history of disease. Counseling  Your health care provider may ask you questions about your: Alcohol use. Tobacco use. Drug use. Emotional  well-being. Home and relationship well-being. Sexual activity. Eating habits. History of falls. Memory and ability to understand (cognition). Work and work astronomer. Reproductive health. Screening  You may have the following tests or measurements: Height, weight, and BMI. Blood pressure. Lipid and cholesterol levels. These may be checked every 5 years, or more frequently if you are over 67 years old. Skin check. Lung cancer screening. You may have this screening every year starting at age 49 if you have a 30-pack-year history of smoking and currently smoke or have quit within the past 15 years. Fecal occult blood test (FOBT) of the stool. You may have this test every year starting at age 40. Flexible sigmoidoscopy or colonoscopy. You may have a sigmoidoscopy every 5 years or a colonoscopy every 10 years starting at age 51. Hepatitis C blood test. Hepatitis B blood test. Sexually transmitted disease (STD) testing. Diabetes screening. This is done by checking your blood sugar (glucose) after you have not eaten for a while (fasting). You may have this done every 1-3 years. Bone density scan. This is done to screen for osteoporosis. You may have this done starting at age 83. Mammogram. This may be done every 1-2 years. Talk to your health care provider about how often you should have regular mammograms. Talk with your health care provider about your test results, treatment options, and if necessary, the need for more tests. Vaccines  Your health care provider may recommend certain vaccines, such as: Influenza vaccine. This is recommended every year. Tetanus, diphtheria, and acellular pertussis (Tdap, Td) vaccine.  You may need a Td booster every 10 years. Zoster vaccine. You may need this after age 62. Pneumococcal 13-valent conjugate (PCV13) vaccine. One dose is recommended after age 75. Pneumococcal polysaccharide (PPSV23) vaccine. One dose is recommended after age 75. Talk to your  health care provider about which screenings and vaccines you need and how often you need them. This information is not intended to replace advice given to you by your health care provider. Make sure you discuss any questions you have with your health care provider. Document Released: 06/28/2015 Document Revised: 02/19/2016 Document Reviewed: 04/02/2015 Elsevier Interactive Patient Education  2017 [employer].  Fall Prevention in the Home Falls can cause injuries. They can happen to people of all ages. There are many things you can do to make your home safe and to help prevent falls. What can I do on the outside of my home? Regularly fix the edges of walkways and driveways and fix any cracks. Remove anything that might make you trip as you walk through a door, such as a raised step or threshold. Trim any bushes or trees on the path to your home. Use bright outdoor lighting. Clear any walking paths of anything that might make someone trip, such as rocks or tools. Regularly check to see if handrails are loose or broken. Make sure that both sides of any steps have handrails. Any raised decks and porches should have guardrails on the edges. Have any leaves, snow, or ice cleared regularly. Use sand or salt on walking paths during winter. Clean up any spills in your garage right away. This includes oil or grease spills. What can I do in the bathroom? Use night lights. Install grab bars by the toilet and in the tub and shower. Do not use towel bars as grab bars. Use non-skid mats or decals in the tub or shower. If you need to sit down in the shower, use a plastic, non-slip stool. Keep the floor dry. Clean up any water that spills on the floor as soon as it happens. Remove soap buildup in the tub or shower regularly. Attach bath mats securely with double-sided non-slip rug tape. Do not have throw rugs and other things on the floor that can make you trip. What can I do in the bedroom? Use night  lights. Make sure that you have a light by your bed that is easy to reach. Do not use any sheets or blankets that are too big for your bed. They should not hang down onto the floor. Have a firm chair that has side arms. You can use this for support while you get dressed. Do not have throw rugs and other things on the floor that can make you trip. What can I do in the kitchen? Clean up any spills right away. Avoid walking on wet floors. Keep items that you use a lot in easy-to-reach places. If you need to reach something above you, use a strong step stool that has a grab bar. Keep electrical cords out of the way. Do not use floor polish or wax that makes floors slippery. If you must use wax, use non-skid floor wax. Do not have throw rugs and other things on the floor that can make you trip. What can I do with my stairs? Do not leave any items on the stairs. Make sure that there are handrails on both sides of the stairs and use them. Fix handrails that are broken or loose. Make sure that handrails are as long as  the stairways. Check any carpeting to make sure that it is firmly attached to the stairs. Fix any carpet that is loose or worn. Avoid having throw rugs at the top or bottom of the stairs. If you do have throw rugs, attach them to the floor with carpet tape. Make sure that you have a light switch at the top of the stairs and the bottom of the stairs. If you do not have them, ask someone to add them for you. What else can I do to help prevent falls? Wear shoes that: Do not have high heels. Have rubber bottoms. Are comfortable and fit you well. Are closed at the toe. Do not wear sandals. If you use a stepladder: Make sure that it is fully opened. Do not climb a closed stepladder. Make sure that both sides of the stepladder are locked into place. Ask someone to hold it for you, if possible. Clearly mark and make sure that you can see: Any grab bars or handrails. First and last  steps. Where the edge of each step is. Use tools that help you move around (mobility aids) if they are needed. These include: Canes. Walkers. Scooters. Crutches. Turn on the lights when you go into a dark area. Replace any light bulbs as soon as they burn out. Set up your furniture so you have a clear path. Avoid moving your furniture around. If any of your floors are uneven, fix them. If there are any pets around you, be aware of where they are. Review your medicines with your doctor. Some medicines can make you feel dizzy. This can increase your chance of falling. Ask your doctor what other things that you can do to help prevent falls. This information is not intended to replace advice given to you by your health care provider. Make sure you discuss any questions you have with your health care provider. Document Released: 03/28/2009 Document Revised: 11/07/2015 Document Reviewed: 07/06/2014 Elsevier Interactive Patient Education  2017 [employer].

## 2023-07-16 ENCOUNTER — Other Ambulatory Visit: Payer: Self-pay | Admitting: Family Medicine

## 2023-07-16 DIAGNOSIS — E039 Hypothyroidism, unspecified: Secondary | ICD-10-CM

## 2023-07-17 ENCOUNTER — Other Ambulatory Visit: Payer: Self-pay | Admitting: Family Medicine

## 2023-07-17 DIAGNOSIS — M792 Neuralgia and neuritis, unspecified: Secondary | ICD-10-CM

## 2023-08-06 ENCOUNTER — Other Ambulatory Visit: Payer: Self-pay | Admitting: Family Medicine

## 2023-08-06 DIAGNOSIS — E785 Hyperlipidemia, unspecified: Secondary | ICD-10-CM

## 2023-08-20 ENCOUNTER — Ambulatory Visit: Payer: 59 | Admitting: Podiatry

## 2023-08-20 ENCOUNTER — Encounter: Payer: Self-pay | Admitting: Podiatry

## 2023-08-20 ENCOUNTER — Other Ambulatory Visit: Payer: Self-pay | Admitting: Family Medicine

## 2023-08-20 DIAGNOSIS — M7751 Other enthesopathy of right foot: Secondary | ICD-10-CM

## 2023-08-20 DIAGNOSIS — M7752 Other enthesopathy of left foot: Secondary | ICD-10-CM | POA: Diagnosis not present

## 2023-08-20 MED ORDER — TRIAMCINOLONE ACETONIDE 10 MG/ML IJ SUSP
10.0000 mg | Freq: Once | INTRAMUSCULAR | Status: AC
  Administered 2023-08-20: 10 mg via INTRA_ARTICULAR

## 2023-08-20 MED ORDER — DICLOFENAC SODIUM 75 MG PO TBEC: 75.0000 mg | DELAYED_RELEASE_TABLET | Freq: Two times a day (BID) | ORAL | 2 refills | Status: DC

## 2023-08-20 MED ORDER — IBUPROFEN 600 MG PO TABS: 600.0000 mg | ORAL_TABLET | Freq: Four times a day (QID) | ORAL | 1 refills | Status: DC | PRN

## 2023-08-20 NOTE — Telephone Encounter (Signed)
 Copied from CRM [id number]. Topic: Clinical - Medication Refill >> Aug 20, 2023  8:43 AM Isabell A wrote: Most Recent Primary Care Visit:  Provider: Juel Burrow  Department: [facility]  Visit Type: ANNUAL WELL VISIT, SEQUENTIAL  Date: 06/29/2023  Medication: ibuprofen (ADVIL) 600 MG tablet  Has the patient contacted their pharmacy? No (Agent: If no, request that the patient contact the pharmacy for the refill. If patient does not wish to contact the pharmacy document the reason why and proceed with request.) (Agent: If yes, when and what did the pharmacy advise?)  Is this the correct pharmacy for this prescription? Yes If no, delete pharmacy and type the correct one.  This is the patient's preferred pharmacy:  [facility] - [place], [place] - [address] [place] [address] Phone: [phone] Fax: [phone]   Has the prescription been filled recently? Yes  Is the patient out of the medication? No 4 left  Has the patient been seen for an appointment in the last year OR does the patient have an upcoming appointment? Yes  Can we respond through MyChart? No  Agent: Please be advised that Rx refills may take up to 3 business days. We ask that you follow-up with your pharmacy.

## 2023-08-24 NOTE — Progress Notes (Signed)
 Subjective:   Patient ID: Kelly [name], female   DOB: 73 y.o.   MRN: [id number]   HPI Patient presents with pain and discomfort around the second MPJ of both feet that is done well in the past with treatment   ROS      Objective:  Physical Exam  Neuro vascular status intact with inflammation pain second MPJ of both feet fluid buildup around the joints F2 inflammatory capsulitis second MPJ bilateral with pain     Assessment:  Reviewed above inflammatory capsulitis     Plan:  H&P sterile prep periarticular injection around the second MPJ bilateral 3 mg dexamethasone Kenalog 5 mg

## 2023-09-01 ENCOUNTER — Other Ambulatory Visit: Payer: Self-pay | Admitting: Family Medicine

## 2023-09-01 DIAGNOSIS — K219 Gastro-esophageal reflux disease without esophagitis: Secondary | ICD-10-CM

## 2023-09-01 DIAGNOSIS — I10 Essential (primary) hypertension: Secondary | ICD-10-CM

## 2023-09-09 ENCOUNTER — Ambulatory Visit: Payer: Self-pay

## 2023-09-09 ENCOUNTER — Ambulatory Visit (INDEPENDENT_AMBULATORY_CARE_PROVIDER_SITE_OTHER): Admitting: Physician Assistant

## 2023-09-09 ENCOUNTER — Encounter: Payer: Self-pay | Admitting: Physician Assistant

## 2023-09-09 ENCOUNTER — Ambulatory Visit (HOSPITAL_BASED_OUTPATIENT_CLINIC_OR_DEPARTMENT_OTHER)
Admission: RE | Admit: 2023-09-09 | Discharge: 2023-09-09 | Disposition: A | Discharge status: Home / Self Care | Source: Ambulatory Visit | Attending: Physician Assistant | Admitting: Physician Assistant

## 2023-09-09 VITALS — BP 156/84 | HR 79 | Temp 98.2°F | Resp 18 | Ht 60.0 in | Wt 240.0 lb

## 2023-09-09 DIAGNOSIS — M79671 Pain in right foot: Secondary | ICD-10-CM

## 2023-09-09 DIAGNOSIS — M25551 Pain in right hip: Secondary | ICD-10-CM | POA: Diagnosis not present

## 2023-09-09 DIAGNOSIS — M79672 Pain in left foot: Secondary | ICD-10-CM | POA: Diagnosis not present

## 2023-09-09 DIAGNOSIS — K420 Umbilical hernia with obstruction, without gangrene: Secondary | ICD-10-CM | POA: Diagnosis not present

## 2023-09-09 DIAGNOSIS — M1611 Unilateral primary osteoarthritis, right hip: Secondary | ICD-10-CM | POA: Diagnosis not present

## 2023-09-09 DIAGNOSIS — M47816 Spondylosis without myelopathy or radiculopathy, lumbar region: Secondary | ICD-10-CM | POA: Diagnosis not present

## 2023-09-09 MED ORDER — LIDOCAINE 5 % EX PTCH
1.0000 | MEDICATED_PATCH | CUTANEOUS | 0 refills | Status: DC
Start: 2023-09-09 — End: 2023-09-12

## 2023-09-09 NOTE — Telephone Encounter (Signed)
  Chief Complaint: R hip pain, toe bruising, top of stomach is hard Symptoms: pain,  Frequency: couple weeks Pertinent Negatives: Patient denies injury, DM2, CP, SOB, Disposition: [] ED /[] Urgent Care (no appt availability in office) / [x] Appointment(In office/virtual)/ []  [facility]/ [] Home Care/ [] Refused Recommended Disposition /[] [facility]/ []  Follow-up with PCP Additional Notes: Pt states that she has some pain. Pt states that her R hip is "catching" when she walks. Pt states feet are also swollen and bruised. Denies injury, denies DM2. Pt also states that the top of her stomach is hard. Pt main concern is the R hip pain. Denies surgery, denies prior fall.  Copied from CRM [id number]. Topic: Clinical - Red Word Triage >> Sep 09, 2023  9:35 AM Gurney Maxin H wrote: [facility] that prompted transfer to Nurse Triage: Right side of hip hurting so bad can't hardly walk, top of stomach is hard. Feet are hurting and bruising can't put anything on it, left toe swollen, right toe bruised. Reason for Disposition  [1] MODERATE pain (e.g., interferes with normal activities, limping) AND [2] present > 3 days  Answer Assessment - Initial Assessment Questions 1. LOCATION and RADIATION: "Where is the pain located?"      R hip 2. QUALITY: "What does the pain feel like?"  (e.g., sharp, dull, aching, burning)     Sharp pain, comes and goes in intensity 3. SEVERITY: "How bad is the pain?" "What does it keep you from doing?"   (Scale 1-10; or mild, moderate, severe)   -  MILD (1-3): doesn't interfere with normal activities    -  MODERATE (4-7): interferes with normal activities (e.g., work or school) or awakens from sleep, limping    -  SEVERE (8-10): excruciating pain, unable to do any normal activities, unable to walk     10 4. ONSET: "When did the pain start?" "Does it come and go, or is it there all the time?"     A couple weeks 5. WORK OR EXERCISE: "Has there been any recent work or  exercise that involved this part of the body?"      denies 6. CAUSE: "What do you think is causing the hip pain?"      denies 7. AGGRAVATING FACTORS: "What makes the hip pain worse?" (e.g., walking, climbing stairs, running)     Walking makes it worse 8. OTHER SYMPTOMS: "Do you have any other symptoms?" (e.g., back pain, pain shooting down leg,  fever, rash)     denies  Protocols used: Hip Pain-A-AH

## 2023-09-09 NOTE — Progress Notes (Signed)
 Established patient visit   Patient: Kelly [name]   DOB: 06-14-1951   73 y.o. Female  MRN: [id number] Visit Date: 09/09/2023  Today's healthcare provider: Alfredia Ferguson, PA-C   Chief Complaint  Patient presents with   Hip Pain    X2 weeks, R hip- has been doing ibuprofen 600mg   Swollen feet, bruising    Abdominal Pain    Hardness in abdomen, some pain, intermittently   Subjective     Discussed the use of AI scribe software for clinical note transcription with the patient, who gave verbal consent to proceed.  History of Present Illness   The patient presents with worsening right hip pain for the past two to three weeks. The pain is localized to the right hip and does not radiate. The pain is severe enough to cause near falls and is temporarily relieved by ibuprofen and diclofenac. She denies injury or fall. Pain is causing her to lose balance.   The patient also reports foot pain and swelling, which has been ongoing. The patient has difficulty wearing shoes due to the pain and has noticed bruising on her feet. The patient has been seen by a podiatrist and received injections for the foot pain, but reports minimal relief.  Additionally, the patient reports a hard mass in her abdomen, which she believes to be a hernia. The mass has been present intermittently but has been persistently present, firm, and painful for the past two to three weeks. The mass is very painful and the patient reports difficulty with bowel movements, but that she is still having them.     Medications: Outpatient Medications Prior to Visit  Medication Sig   aspirin 81 MG EC tablet Take 81 mg by mouth daily.   atorvastatin (LIPITOR) 20 MG tablet TAKE 1 TABLET BY MOUTH DAILY.   benazepril (LOTENSIN) 10 MG tablet TAKE 1 TABLET (10 MG TOTAL) BY MOUTH DAILY.   Calcium-Vitamin D-Vitamin K 500-500-40 MG-UNT-MCG CHEW Chew 2 tablets by mouth daily. VIACTIV   Cholecalciferol (VITAMIN D3) 2000 units capsule  Take 2,000 Units by mouth daily.   diclofenac (VOLTAREN) 75 MG EC tablet Take 1 tablet (75 mg total) by mouth 2 (two) times daily.   famotidine (PEPCID) 20 MG tablet TAKE 1 TABLET BY MOUTH 2 TIMES DAILY.   fluticasone (FLONASE) 50 MCG/ACT nasal spray Place 2 sprays into both nostrils daily.   furosemide (LASIX) 40 MG tablet Take 1 tablet (40 mg total) by mouth daily.   gabapentin (NEURONTIN) 100 MG capsule TAKE 1 CAPSULE BY MOUTH 2 TIMES DAILY.   potassium chloride SA (KLOR-CON M) 20 MEQ tablet TAKE 1 TABLET (20 MEQ TOTAL) BY MOUTH DAILY.   SYNTHROID 75 MCG tablet TAKE 1 TABLET (75 MCG TOTAL) BY MOUTH DAILY BEFORE BREAKFAST.   [DISCONTINUED] ibuprofen (ADVIL) 600 MG tablet Take 1 tablet (600 mg total) by mouth every 6 (six) hours as needed.   [DISCONTINUED] meloxicam (MOBIC) 15 MG tablet Take 1 tablet (15 mg total) by mouth daily. (Patient not taking: Reported on 09/09/2023)   Facility-Administered Medications Prior to Visit  Medication Dose Route Frequency Provider   betamethasone acetate-betamethasone sodium phosphate (CELESTONE) injection 3 mg  3 mg Intra-articular Once Felecia Shelling, DPM    Review of Systems  Constitutional:  Negative for fatigue and fever.  Respiratory:  Negative for cough and shortness of breath.   Cardiovascular:  Positive for leg swelling. Negative for chest pain.  Gastrointestinal:  Positive for abdominal pain and constipation.  Musculoskeletal:  Positive for arthralgias.  Neurological:  Negative for dizziness and headaches.       Objective    BP (!) 156/84   Pulse 79   Temp 98.2 F (36.8 C) (Oral)   Resp 18   Ht 5' (1.524 m)   Wt 240 lb (108.9 kg) Comment: wheelchair  SpO2 92%   BMI 46.87 kg/m    Physical Exam Constitutional:      General: She is awake.     Appearance: She is well-developed.  HENT:     Head: Normocephalic.  Eyes:     Conjunctiva/sclera: Conjunctivae normal.  Cardiovascular:     Rate and Rhythm: Normal rate and regular  rhythm.     Heart sounds: Normal heart sounds.  Pulmonary:     Effort: Pulmonary effort is normal.     Breath sounds: Normal breath sounds.  Abdominal:     Tenderness: There is abdominal tenderness in the epigastric area and periumbilical area. There is guarding.     Hernia: A hernia is present. Hernia is present in the umbilical area.     Comments: Exam limited 2/2 mobility. Obese abdomen Most of abdomen is soft, epigastric/periumblical there is a very hard mass that is  acutely tender. No skin changes. If this is the hernia, non reducible, non mobile.   Musculoskeletal:     Comments: Right hip, tender over the greater trochanter. No visibile skin changes Unable to assess further, pt unable to ambulate for ROM  Feet:     Comments: B/l feet with some arthritic deformity and bunions . Right 3rd toe with some small bruising. Non tender.   No significant edema, wounds Skin:    General: Skin is warm.  Neurological:     Mental Status: She is alert and oriented to person, place, and time.  Psychiatric:        Attention and Perception: Attention normal.        Mood and Affect: Mood normal.        Speech: Speech normal.        Behavior: Behavior is cooperative.     No results found for any visits on 09/09/23.  Assessment & Plan    Right hip pain - Order hip x-ray - Discontinue ibuprofen given she is also taking diclofenac bid. Pt reports no longer taking meloxicam. Advised against concurrent use of multiple NSAIDs due to gastrointestinal and bleeding risks.  - Prescribe lidocaine patches for hip pain  -     DG HIP UNILAT W OR W/O PELVIS 2-3 VIEWS RIGHT -     Lidocaine; Place 1 patch onto the skin daily. Remove & Discard patch within 12 hours  Dispense: 30 patch; Refill: 0  Irreducible umbilical hernia Reviewed imaging, 2021 there was a small umbilical hernia: 12/22/19: There is a fat containing periumbilical hernia immediately superior to the umbilicus. The abdominal wall defect  measures approximately 1.2 cm (axial series 2, image 50). The hernia sac measures approximately 3.8 cm.   On exam, epigastric/periumbilical area there is a hard, acutely tender mass vs hernia. Pt is obese there is no protrusion  She is still having BM but reports they are difficult and causes worsening pain   Recommending ct abdomen r/o obstruction or gangrene, given longevity of symptoms, 2-3 weeks, still having BM, stable vitas, ok for outpt CT  Given pt ED recommendations for worsening symptoms -     CT ABDOMEN PELVIS W CONTRAST; Future  Bilateral foot pain - Recommend special orthotics for  foot support-- that she f/b with podiatry.  Return if symptoms worsen or fail to improve.     Alfredia Ferguson, PA-C  [facility] [phone] (phone) [phone] (fax)  [facility]

## 2023-09-12 ENCOUNTER — Other Ambulatory Visit: Payer: Self-pay

## 2023-09-12 ENCOUNTER — Emergency Department (HOSPITAL_COMMUNITY)

## 2023-09-12 ENCOUNTER — Encounter (HOSPITAL_COMMUNITY): Payer: Self-pay | Admitting: Emergency Medicine

## 2023-09-12 ENCOUNTER — Emergency Department (HOSPITAL_COMMUNITY): Admission: EM | Admit: 2023-09-12 | Discharge: 2023-09-12 | Disposition: A | Discharge status: Home / Self Care

## 2023-09-12 DIAGNOSIS — Z79899 Other long term (current) drug therapy: Secondary | ICD-10-CM | POA: Diagnosis not present

## 2023-09-12 DIAGNOSIS — R509 Fever, unspecified: Secondary | ICD-10-CM | POA: Diagnosis not present

## 2023-09-12 DIAGNOSIS — M25551 Pain in right hip: Secondary | ICD-10-CM | POA: Insufficient documentation

## 2023-09-12 DIAGNOSIS — I10 Essential (primary) hypertension: Secondary | ICD-10-CM | POA: Diagnosis not present

## 2023-09-12 DIAGNOSIS — Z7982 Long term (current) use of aspirin: Secondary | ICD-10-CM | POA: Diagnosis not present

## 2023-09-12 DIAGNOSIS — Z743 Need for continuous supervision: Secondary | ICD-10-CM | POA: Diagnosis not present

## 2023-09-12 DIAGNOSIS — R6889 Other general symptoms and signs: Secondary | ICD-10-CM | POA: Diagnosis not present

## 2023-09-12 DIAGNOSIS — M1611 Unilateral primary osteoarthritis, right hip: Secondary | ICD-10-CM | POA: Diagnosis not present

## 2023-09-12 DIAGNOSIS — M25572 Pain in left ankle and joints of left foot: Secondary | ICD-10-CM | POA: Diagnosis not present

## 2023-09-12 DIAGNOSIS — Z7401 Bed confinement status: Secondary | ICD-10-CM | POA: Diagnosis not present

## 2023-09-12 MED ORDER — TRAMADOL HCL 50 MG PO TABS: 50.0000 mg | ORAL_TABLET | Freq: Four times a day (QID) | ORAL | 0 refills | Status: DC | PRN

## 2023-09-12 MED ORDER — FUROSEMIDE 20 MG PO TABS
40.0000 mg | ORAL_TABLET | Freq: Every day | ORAL | Status: DC
  Filled 2023-09-12: qty 2

## 2023-09-12 MED ORDER — ATORVASTATIN CALCIUM 40 MG PO TABS: 20.0000 mg | ORAL_TABLET | Freq: Every day | ORAL | Status: DC

## 2023-09-12 MED ORDER — ONDANSETRON 4 MG PO TBDP
4.0000 mg | ORAL_TABLET | Freq: Once | ORAL | Status: AC
  Administered 2023-09-12: 4 mg via ORAL
  Filled 2023-09-12: qty 1

## 2023-09-12 MED ORDER — OXYCODONE HCL 5 MG PO TABS
5.0000 mg | ORAL_TABLET | Freq: Once | ORAL | Status: AC
  Administered 2023-09-12: 5 mg via ORAL
  Filled 2023-09-12: qty 1

## 2023-09-12 MED ORDER — BENAZEPRIL HCL 5 MG PO TABS
10.0000 mg | ORAL_TABLET | Freq: Every day | ORAL | Status: DC
  Administered 2023-09-12: 10 mg via ORAL
  Filled 2023-09-12: qty 2

## 2023-09-12 MED ORDER — ACETAMINOPHEN 325 MG PO TABS
650.0000 mg | ORAL_TABLET | Freq: Once | ORAL | Status: AC
  Administered 2023-09-12: 650 mg via ORAL
  Filled 2023-09-12: qty 2

## 2023-09-12 MED ORDER — ATORVASTATIN CALCIUM 40 MG PO TABS
20.0000 mg | ORAL_TABLET | Freq: Every day | ORAL | Status: DC
  Administered 2023-09-12: 20 mg via ORAL
  Filled 2023-09-12: qty 1

## 2023-09-12 MED ORDER — GABAPENTIN 100 MG PO CAPS
100.0000 mg | ORAL_CAPSULE | Freq: Two times a day (BID) | ORAL | Status: DC
  Administered 2023-09-12: 100 mg via ORAL
  Filled 2023-09-12: qty 1

## 2023-09-12 MED ORDER — LEVOTHYROXINE SODIUM 75 MCG PO TABS: 75.0000 ug | ORAL_TABLET | Freq: Every day | ORAL | Status: DC

## 2023-09-12 MED ORDER — KETOROLAC TROMETHAMINE 15 MG/ML IJ SOLN
15.0000 mg | Freq: Once | INTRAMUSCULAR | Status: AC
  Administered 2023-09-12: 15 mg via INTRAMUSCULAR
  Filled 2023-09-12: qty 1

## 2023-09-12 MED ORDER — GABAPENTIN 100 MG PO CAPS: 100.0000 mg | ORAL_CAPSULE | Freq: Two times a day (BID) | ORAL | Status: DC

## 2023-09-12 MED ORDER — BENAZEPRIL HCL 5 MG PO TABS: 10.0000 mg | ORAL_TABLET | Freq: Every day | ORAL | Status: DC

## 2023-09-12 MED ORDER — FUROSEMIDE 20 MG PO TABS: 40.0000 mg | ORAL_TABLET | Freq: Every day | ORAL | Status: DC

## 2023-09-12 MED ORDER — LIDOCAINE 4 % EX PTCH: 1.0000 | MEDICATED_PATCH | CUTANEOUS | 0 refills | Status: DC
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
## 2023-09-12 NOTE — ED Triage Notes (Signed)
 Pt BIB by EMS reporting right hip pain x 2 weeks. States she was getting dressed this morning when pain exacerbated. Alert and oriented on arrival. Denies any injury or trauma. Seen by PCP Thursday for similar issue. Baseline deficits to LUE.

## 2023-09-12 NOTE — Care Management (Cosign Needed)
    Durable Medical Equipment  (From admission, onward)           Start     Ordered   09/12/23 1732  For home use only DME Bedside commode  Once       Question:  Patient needs a bedside commode to treat with the following condition  Answer:  Weakness generalized   09/12/23 1731           The patient has increase in hip pain and dizziness, confining her to one room. She needs a bedside commode to promote elimination.
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
## 2023-09-12 NOTE — Care Management (Addendum)
 Transition of Care [facility]) - Emergency Department Mini Assessment   Patient Details  Name: [name] MRN: [id number] Date of Birth: 05-31-1951  [facility] CM/SW Contact:    Lockie Pares, RN Phone Number: 09/12/2023, 12:57 PM   Clinical Narrative: Discussed with team. Met with patient at hte bedside, introduced self. Patient very pleasant . She has a aide that comes to her apartment 3 hours a day, she lives alone. She has a hemi walker and a electric wheelchair. She is getting a new wheelchair soon, PCP is working on this. She is amenable to home health services She has declined SNF. PT and OT orders will be placed.  [facility] patient. No further needs identified 1510 PT evaluation complete, they recommend a increase in possible in [facility] services.  HH orders in ED Mini Assessment: What brought you to the Emergency Department? : Hip pain  Barriers to Discharge: ED No Barriers        Interventions which prevented an admission or readmission: Home Health Consult or Services    Patient Contact and Communications    Spoke to patient at bedside    ,                 Admission diagnosis:  R Hip Pain Patient Active Problem List   Diagnosis Date Noted   Preventative health care 04/16/2023   Abnormal ankle brachial index (ABI) 01/11/2023   Leg cramps 01/11/2023   Numbness of right hand 01/11/2023   Suspicious nevus 01/11/2023   Hemiparesis, left (HCC) 06/26/2021   Acquired trigger finger of right middle finger 10/04/2020   Carpal tunnel syndrome of right wrist 09/06/2020   Nausea vomiting and diarrhea 06/18/2020   Hematoma 03/15/2018   Fibroid uterus 08/10/2017   Bilateral lower extremity edema 12/14/2016   Varicose veins of both legs with edema 10/16/2016   Pain of right calf 07/20/2016   Metatarsal bone fracture 01/14/2016   Chest pain 07/17/2015   Hypothyroidism 07/17/2015   Pain in the chest    Fractured toe 03/15/2015   Back  pain, thoracic 10/17/2014   Hip pain 10/17/2014   Sciatica of right side 10/17/2014   Benign paroxysmal positional vertigo 09/16/2014   Frequent falls 09/16/2014   Gait instability 09/16/2014   Postmenopausal vaginal bleeding 05/08/2014   Injury of left foot 11/01/2013   Obesity (BMI 30-39.9) 07/24/2013   Ankle pain, left 02/19/2012   Foot pain, left 02/19/2012   Wound infection 02/19/2012   COLONIC POLYPS 05/15/2010   Umbilical hernia 07/01/2009   ABDOMINAL PAIN, LEFT LOWER QUADRANT 05/16/2009   ABDOMINAL PAIN OTHER SPECIFIED SITE 04/29/2009   Hypertrophic and atrophic condition of skin 04/05/2009   BACK PAIN, THORACIC REGION 04/01/2009   CHEST PAIN, ATYPICAL 04/01/2009   ALLERGIC RHINITIS DUE TO POLLEN 11/02/2008   Osteoporosis 08/30/2008   Morbid (severe) obesity due to excess calories (HCC) 08/02/2008   Backache 04/27/2008   HYPERGLYCEMIA 01/04/2008   ASTHMATIC BRONCHITIS, ACUTE 10/18/2007   EDEMA 10/06/2007   Pain in limb 04/28/2007   PULMONARY EMBOLISM, HX OF 03/12/2007   Hyperlipidemia 09/29/2006   Paralysis (HCC) 09/29/2006   Essential hypertension 09/29/2006   Acute thromboembolism of deep veins of lower extremity (HCC) 09/29/2006   PULMONARY EDEMA 09/29/2006   Osteoarthritis 09/29/2006   BREAST CANCER, HX OF 09/29/2006   KNEE REPLACEMENT, RIGHT, HX OF 09/29/2006   Other postprocedural status(V45.89) 09/29/2006   PCP:  Donato Schultz, DO Pharmacy:   [facility] -  [place], [place] - [address] [place] [address] Phone: [phone] Fax: [phone]

## 2023-09-12 NOTE — Discharge Instructions (Addendum)
 He may take over-the-counter Tylenol alternate with ibuprofen for pain.  He may also use a lidocaine patch.  We are prescribing tramadol.  Please use caution as it may predispose you to falling.  Return immediately if develop fevers, chills, severe pain, fall, passout, chest pain, shortness of breath, numbness tingling changes in your leg, leg becomes blue, cold, pale or he develop any new or worsening symptoms that are concerning to you.

## 2023-09-12 NOTE — ED Provider Notes (Signed)
 Assumed care of the patient at 1500.  Patient eval by PT and OT for home health.  Concern for safety at home, but patient declining SNF.  Would like to try and go home and see how she does.  With limited mobility would benefit from bedside commode.      Melene Plan, DO 09/12/23 [phone]

## 2023-09-13 NOTE — Progress Notes (Signed)
 Called patient but no answer, left voice mail for patient to call back.

## 2023-09-16 ENCOUNTER — Other Ambulatory Visit (HOSPITAL_COMMUNITY): Payer: Self-pay

## 2023-09-17 ENCOUNTER — Other Ambulatory Visit: Payer: Self-pay | Admitting: Family Medicine

## 2023-09-17 DIAGNOSIS — I10 Essential (primary) hypertension: Secondary | ICD-10-CM

## 2023-09-20 ENCOUNTER — Telehealth: Payer: Self-pay | Admitting: Family Medicine

## 2023-09-20 NOTE — Telephone Encounter (Unsigned)
 Copied from CRM [id number]. Topic: Clinical - Home Health Verbal Orders >> Sep 20, 2023  3:26 PM Mickle Plumb wrote: Caller/Agency: [facility] Callback Number: [phone] Service Requested: Physical Therapy Frequency: unknown Any new concerns about the patient? Yes

## 2023-09-22 DIAGNOSIS — M1611 Unilateral primary osteoarthritis, right hip: Secondary | ICD-10-CM | POA: Diagnosis not present

## 2023-09-23 ENCOUNTER — Emergency Department (HOSPITAL_COMMUNITY)

## 2023-09-23 ENCOUNTER — Other Ambulatory Visit: Payer: Self-pay

## 2023-09-23 ENCOUNTER — Other Ambulatory Visit: Payer: Self-pay | Admitting: Orthopedic Surgery

## 2023-09-23 ENCOUNTER — Encounter (HOSPITAL_COMMUNITY): Payer: Self-pay

## 2023-09-23 ENCOUNTER — Emergency Department (HOSPITAL_COMMUNITY)
Admission: EM | Admit: 2023-09-23 | Discharge: 2023-09-23 | Disposition: A | Discharge status: Home / Self Care | Attending: Emergency Medicine | Admitting: Emergency Medicine

## 2023-09-23 DIAGNOSIS — Z7982 Long term (current) use of aspirin: Secondary | ICD-10-CM | POA: Diagnosis not present

## 2023-09-23 DIAGNOSIS — M25551 Pain in right hip: Secondary | ICD-10-CM

## 2023-09-23 DIAGNOSIS — S2242XA Multiple fractures of ribs, left side, initial encounter for closed fracture: Secondary | ICD-10-CM | POA: Diagnosis not present

## 2023-09-23 DIAGNOSIS — Y92009 Unspecified place in unspecified non-institutional (private) residence as the place of occurrence of the external cause: Secondary | ICD-10-CM | POA: Insufficient documentation

## 2023-09-23 DIAGNOSIS — Z7401 Bed confinement status: Secondary | ICD-10-CM | POA: Diagnosis not present

## 2023-09-23 DIAGNOSIS — W1839XA Other fall on same level, initial encounter: Secondary | ICD-10-CM | POA: Diagnosis not present

## 2023-09-23 DIAGNOSIS — M791 Myalgia, unspecified site: Secondary | ICD-10-CM | POA: Insufficient documentation

## 2023-09-23 DIAGNOSIS — R109 Unspecified abdominal pain: Secondary | ICD-10-CM | POA: Diagnosis not present

## 2023-09-23 DIAGNOSIS — K760 Fatty (change of) liver, not elsewhere classified: Secondary | ICD-10-CM | POA: Diagnosis not present

## 2023-09-23 DIAGNOSIS — M549 Dorsalgia, unspecified: Secondary | ICD-10-CM | POA: Diagnosis not present

## 2023-09-23 DIAGNOSIS — R531 Weakness: Secondary | ICD-10-CM | POA: Diagnosis not present

## 2023-09-23 DIAGNOSIS — W19XXXA Unspecified fall, initial encounter: Secondary | ICD-10-CM

## 2023-09-23 DIAGNOSIS — M7918 Myalgia, other site: Secondary | ICD-10-CM

## 2023-09-23 DIAGNOSIS — S3991XA Unspecified injury of abdomen, initial encounter: Secondary | ICD-10-CM | POA: Diagnosis not present

## 2023-09-23 DIAGNOSIS — M24151 Other articular cartilage disorders, right hip: Secondary | ICD-10-CM

## 2023-09-23 DIAGNOSIS — R0781 Pleurodynia: Secondary | ICD-10-CM | POA: Diagnosis not present

## 2023-09-23 DIAGNOSIS — K5730 Diverticulosis of large intestine without perforation or abscess without bleeding: Secondary | ICD-10-CM | POA: Diagnosis not present

## 2023-09-23 DIAGNOSIS — Z743 Need for continuous supervision: Secondary | ICD-10-CM | POA: Diagnosis not present

## 2023-09-23 MED ORDER — IBUPROFEN 400 MG PO TABS
400.0000 mg | ORAL_TABLET | Freq: Once | ORAL | Status: AC
  Administered 2023-09-23: 400 mg via ORAL
  Filled 2023-09-23: qty 1

## 2023-09-23 MED ORDER — OXYCODONE HCL 5 MG PO TABS
5.0000 mg | ORAL_TABLET | Freq: Once | ORAL | Status: AC
  Administered 2023-09-23: 5 mg via ORAL
  Filled 2023-09-23: qty 1

## 2023-09-23 MED ORDER — FENTANYL CITRATE PF 50 MCG/ML IJ SOSY
25.0000 ug | PREFILLED_SYRINGE | Freq: Once | INTRAMUSCULAR | Status: AC
  Administered 2023-09-23: 25 ug via INTRAMUSCULAR
  Filled 2023-09-23: qty 1

## 2023-09-23 MED ORDER — LIDOCAINE 5 % EX PTCH: 1.0000 | MEDICATED_PATCH | CUTANEOUS | 0 refills | Status: DC

## 2023-09-23 NOTE — ED Notes (Signed)
 CALLED FOR PT  PICK ETA 3 HOURS

## 2023-09-23 NOTE — Discharge Instructions (Signed)
 Today you were seen after a fall.  Please take Tylenol Motrin as needed for pain.  You may also use Lidoderm patches from your pharmacy for pain as needed.  Thank you for letting us treat you today. After reviewing your imaging, I feel you are safe to go home. Please follow up with your PCP in the next several days and provide them with your records from this visit. Return to the Emergency Room if pain becomes severe or symptoms worsen.

## 2023-09-23 NOTE — ED Triage Notes (Signed)
 Pt BIB [facility] from home d/t falling this morning. Pt is paralyzed on Lt side at baseline & fell while using her Lt side-one arm walker. A/Ox4, denies hitting her head & does not recall hitting anything while falling but EMS reports a scratch on her back & pt does endorse some Lt sided-mid back pain with some difficulty breathing. EMS reports she fell next to a sink corner & could have hit that. All VSS other than RA was 91% so they placed her on 3L O2 via n/c.

## 2023-09-23 NOTE — ED Provider Notes (Signed)
 [place] EMERGENCY DEPARTMENT AT [facility] Provider Note   CSN: [id number] Arrival date & time: 09/23/23  1031     History  Chief Complaint  Patient presents with   Kelly [name]    Kelly [name] is a 73 y.o. female presents today after a mechanical fall this morning.  Patient is paralyzed on the left side at baseline and fell while using her walker.  Patient denies head injury or hitting anything while falling.  Patient endorses some left-sided flank pain.  Patient denies shortness of breath, head trauma, loss of consciousness, neck pain, chest pain, or blood thinner use.   Fall       Home Medications Prior to Admission medications   Medication Sig Start Date End Date Taking? Authorizing Provider  lidocaine (LIDODERM) 5 % Place 1 patch onto the skin daily. Remove & Discard patch within 12 hours or as directed by MD 09/23/23  Yes Dolphus Jenny, PA-C  aspirin 81 MG EC tablet Take 81 mg by mouth daily.    [provider]  atorvastatin (LIPITOR) 20 MG tablet TAKE 1 TABLET BY MOUTH DAILY. 08/06/23   Seabron Spates R, DO  benazepril (LOTENSIN) 10 MG tablet TAKE 1 TABLET (10 MG TOTAL) BY MOUTH DAILY. 09/01/23   Donato Schultz, DO  Calcium-Vitamin D-Vitamin K 500-500-40 MG-UNT-MCG CHEW Chew 2 tablets by mouth daily. VIACTIV Patient not taking: Reported on 09/12/2023    [provider]  Cholecalciferol (VITAMIN D3) 2000 units capsule Take 2,000 Units by mouth daily.    [provider]  diclofenac (VOLTAREN) 75 MG EC tablet Take 1 tablet (75 mg total) by mouth 2 (two) times daily. 08/20/23   Lenn Sink, DPM  famotidine (PEPCID) 20 MG tablet TAKE 1 TABLET BY MOUTH 2 TIMES DAILY. 09/01/23   Seabron Spates R, DO  fluticasone (FLONASE) 50 MCG/ACT nasal spray Place 2 sprays into both nostrils daily. Patient not taking: Reported on 09/12/2023 01/06/22   Zola Button, Grayling Congress, DO  furosemide (LASIX) 40 MG tablet Take 1 tablet (40 mg total) by  mouth daily. 09/17/23   Seabron Spates R, DO  gabapentin (NEURONTIN) 100 MG capsule TAKE 1 CAPSULE BY MOUTH 2 TIMES DAILY. 07/19/23   Seabron Spates R, DO  potassium chloride SA (KLOR-CON M) 20 MEQ tablet TAKE 1 TABLET (20 MEQ TOTAL) BY MOUTH DAILY. 09/01/23   Lowne Chase, Yvonne R, DO  SYNTHROID 75 MCG tablet TAKE 1 TABLET (75 MCG TOTAL) BY MOUTH DAILY BEFORE BREAKFAST. 07/16/23   Donato Schultz, DO  traMADol (ULTRAM) 50 MG tablet Take 1 tablet (50 mg total) by mouth every 6 (six) hours as needed. 09/12/23   Coral Spikes, DO      Allergies    Percocet [oxycodone-acetaminophen], Codeine, Flexeril [cyclobenzaprine], Naproxen, Aspirin, and Tape    Review of Systems   Review of Systems  Musculoskeletal:  Positive for arthralgias.    Physical Exam Updated Vital Signs BP (!) 116/59 (BP Location: Right Wrist)   Pulse (!) 57   Temp 97.7 F (36.5 C) (Oral)   Resp 17   SpO2 98%  Physical Exam Vitals and nursing note reviewed.  Constitutional:      General: She is not in acute distress.    Appearance: She is well-developed. She is obese. She is not ill-appearing, toxic-appearing or diaphoretic.  HENT:     Head: Normocephalic and atraumatic.     Right Ear: External ear normal.  Left Ear: External ear normal.     Nose: Nose normal.     Mouth/Throat:     Mouth: Mucous membranes are moist.     Pharynx: Oropharynx is clear.  Eyes:     Extraocular Movements: Extraocular movements intact.     Conjunctiva/sclera: Conjunctivae normal.     Pupils: Pupils are equal, round, and reactive to light.  Cardiovascular:     Rate and Rhythm: Normal rate and regular rhythm.     Pulses: Normal pulses.     Heart sounds: No murmur heard. Pulmonary:     Effort: Pulmonary effort is normal. No respiratory distress.     Breath sounds: Normal breath sounds.  Abdominal:     Palpations: Abdomen is soft.     Tenderness: There is no abdominal tenderness.  Musculoskeletal:        General: No  swelling.     Cervical back: Normal range of motion and neck supple.     Comments: Patient has tenderness to palpation of the left lower flank without obvious ecchymosis or injury.  Skin:    General: Skin is warm and dry.     Capillary Refill: Capillary refill takes less than 2 seconds.     Comments: Superficial abrasion across patient's left lower flank  Neurological:     Mental Status: She is alert and oriented to person, place, and time. Mental status is at baseline.  Psychiatric:        Mood and Affect: Mood normal.     ED Results / Procedures / Treatments   Labs (all labs ordered are listed, but only abnormal results are displayed) Labs Reviewed - No data to display  EKG None  Radiology CT ABDOMEN PELVIS WO CONTRAST Result Date: 09/23/2023 CLINICAL DATA:  Left flank pain.  Abdominal trauma. EXAM: CT ABDOMEN AND PELVIS WITHOUT CONTRAST TECHNIQUE: Multidetector CT imaging of the abdomen and pelvis was performed following the standard protocol without IV contrast. RADIATION DOSE REDUCTION: This exam was performed according to the departmental dose-optimization program which includes automated exposure control, adjustment of the mA and/or kV according to patient size and/or use of iterative reconstruction technique. COMPARISON:  CT abdomen pelvis dated 12/22/2019. FINDINGS: Evaluation of this exam is limited in the absence of intravenous contrast. Lower chest: The visualized lung bases are clear. No intra-abdominal free air or free fluid. Hepatobiliary: Fatty liver. No biliary dilatation. Cholecystectomy. Pancreas: Unremarkable. No pancreatic ductal dilatation or surrounding inflammatory changes. Spleen: Normal in size without focal abnormality. Adrenals/Urinary Tract: Stable bilateral adrenal myelolipomas measure up to 5 cm on the left. There is no hydronephrosis or nephrolithiasis on either side. The visualized ureters and urinary bladder appear unremarkable. Stomach/Bowel: There is  moderate stool throughout the colon. Mild sigmoid diverticulosis without active inflammatory changes. The appendix is not visualized with certainty. No inflammatory changes identified in the right lower quadrant. Vascular/Lymphatic: The abdominal aorta and IVC are grossly unremarkable on this noncontrast CT. No portal venous gas. There is no adenopathy. Reproductive: Hysterectomy.  No suspicious adnexal masses. Other: None Musculoskeletal: Osteopenia with degenerative changes of the spine. No acute osseous pathology. IMPRESSION: 1. No acute intra-abdominal or pelvic pathology. No hydronephrosis or nephrolithiasis. 2. Fatty liver. 3. Mild sigmoid diverticulosis. Electronically Signed   By: Elgie Collard M.D.   On: 09/23/2023 14:53   DG Ribs Unilateral W/Chest Left Result Date: 09/23/2023 CLINICAL DATA:  Left lower rib pain after fall today. EXAM: LEFT RIBS AND CHEST - 3+ VIEW COMPARISON:  May 11, 2016. FINDINGS: Old  left fourth and fifth rib fractures are noted. No acute fractures are noted. There is no evidence of pneumothorax or pleural effusion. Both lungs are clear. Heart size and mediastinal contours are within normal limits. IMPRESSION: Old left rib fractures as noted above. No acute rib fractures are noted. Electronically Signed   By: Lupita Raider M.D.   On: 09/23/2023 13:00    Procedures Procedures    Medications Ordered in ED Medications  oxyCODONE (Oxy IR/ROXICODONE) immediate release tablet 5 mg (5 mg Oral Given 09/23/23 1128)  fentaNYL (SUBLIMAZE) injection 25 mcg (25 mcg Intramuscular Given 09/23/23 1414)    ED Course/ Medical Decision Making/ A&P                                 Medical Decision Making Amount and/or Complexity of Data Reviewed Radiology: ordered.  Risk Prescription drug management.   This patient presents to the ED with chief complaint(s) of fall with pertinent past medical history of hemiplegia which further complicates the presenting complaint. The  complaint involves an extensive differential diagnosis and also carries with it a high risk of complications and morbidity.    The differential diagnosis includes rib fracture, musculoskeletal pain, kidney lac  Additional history obtained: Additional history obtained from family Records reviewed Primary Care Documents  ED Course and Reassessment:   Independent visualization of imaging: - I independently visualized the following imaging with scope of interpretation limited to determining acute life threatening conditions related to emergency care:  CT abdomen pelvis without contrast: No acute intraabdominal or pelvic pathology Left rib x-ray: No acute rib fractures  Consultation: - Consulted or discussed management/test interpretation w/ external professional: None  Consideration for admission or further workup: Considered for admission or further workup however patient's vital signs, physical exam, and imaging were reassuring.  Patient symptoms likely due to musculoskeletal pain.  Patient advised to take Tylenol and Motrin as needed for pain.  Patient also given Lidoderm patches for pain as needed.        Final Clinical Impression(s) / ED Diagnoses Final diagnoses:  Fall in home, initial encounter  Musculoskeletal pain    Rx / DC Orders ED Discharge Orders          Ordered    lidocaine (LIDODERM) 5 %  Every 24 hours        09/23/23 1501              Dolphus Jenny, PA-C 09/23/23 1532    Pricilla Loveless, MD 09/23/23 [phone]

## 2023-09-24 ENCOUNTER — Other Ambulatory Visit: Payer: Self-pay | Admitting: Family Medicine

## 2023-09-24 NOTE — Telephone Encounter (Signed)
 Copied from CRM [id number]. Topic: Clinical - Medication Refill >> Sep 24, 2023  8:33 AM [name] wrote: Most Recent Primary Care Visit:  Provider: Alfredia Ferguson  Department: [facility]  Visit Type: ACUTE  Date: 09/09/2023  Medication: diclofenac (VOLTAREN) 75 MG EC tablet  Has the patient contacted their pharmacy? Yes (Agent: If no, request that the patient contact the pharmacy for the refill. If patient does not wish to contact the pharmacy document the reason why and proceed with request.) (Agent: If yes, when and what did the pharmacy advise?)  Is this the correct pharmacy for this prescription? Yes If no, delete pharmacy and type the correct one.  This is the patient's preferred pharmacy:  [facility] - [place], [place] - [address] [place] [place] [address] Phone: [phone] Fax: [phone]   Has the prescription been filled recently? No ( pt daughter stated her mom never received this medication and she is in need of this medication)  Is the patient out of the medication? Yes  Has the patient been seen for an appointment in the last year OR does the patient have an upcoming appointment? Yes  Can we respond through MyChart? Yes  Agent: Please be advised that Rx refills may take up to 3 business days. We ask that you follow-up with your pharmacy.

## 2023-09-25 MED ORDER — DICLOFENAC SODIUM 75 MG PO TBEC: 75.0000 mg | DELAYED_RELEASE_TABLET | Freq: Two times a day (BID) | ORAL | 2 refills | Status: AC

## 2023-09-27 ENCOUNTER — Ambulatory Visit (HOSPITAL_BASED_OUTPATIENT_CLINIC_OR_DEPARTMENT_OTHER): Admission: RE | Admit: 2023-09-27 | Source: Ambulatory Visit

## 2023-10-11 ENCOUNTER — Ambulatory Visit (HOSPITAL_BASED_OUTPATIENT_CLINIC_OR_DEPARTMENT_OTHER)
Admission: RE | Admit: 2023-10-11 | Discharge: 2023-10-11 | Disposition: A | Discharge status: Home / Self Care | Source: Ambulatory Visit | Attending: Physician Assistant | Admitting: Physician Assistant

## 2023-10-11 ENCOUNTER — Encounter (HOSPITAL_BASED_OUTPATIENT_CLINIC_OR_DEPARTMENT_OTHER): Payer: Self-pay | Admitting: Radiology

## 2023-10-11 DIAGNOSIS — K429 Umbilical hernia without obstruction or gangrene: Secondary | ICD-10-CM | POA: Diagnosis not present

## 2023-10-11 DIAGNOSIS — K420 Umbilical hernia with obstruction, without gangrene: Secondary | ICD-10-CM | POA: Diagnosis not present

## 2023-10-11 DIAGNOSIS — R1900 Intra-abdominal and pelvic swelling, mass and lump, unspecified site: Secondary | ICD-10-CM | POA: Diagnosis not present

## 2023-10-11 DIAGNOSIS — K439 Ventral hernia without obstruction or gangrene: Secondary | ICD-10-CM | POA: Diagnosis not present

## 2023-10-11 MED ORDER — IOHEXOL 300 MG/ML  SOLN
100.0000 mL | Freq: Once | INTRAMUSCULAR | Status: AC | PRN
  Administered 2023-10-11: 100 mL via INTRAVENOUS

## 2023-10-12 ENCOUNTER — Telehealth: Payer: Self-pay

## 2023-10-12 ENCOUNTER — Encounter: Payer: Self-pay | Admitting: Physician Assistant

## 2023-10-12 NOTE — Telephone Encounter (Signed)
 Copied from CRM [id number]. Topic: Clinical - Lab/Test Results >> Oct 12, 2023  9:16 AM Howard Macho wrote: Reason for CRM: patient called stating she had a test done regarding her hernia at the hospital and she want to know the results of it

## 2023-10-19 ENCOUNTER — Ambulatory Visit (INDEPENDENT_AMBULATORY_CARE_PROVIDER_SITE_OTHER): Admitting: Physician Assistant

## 2023-10-19 ENCOUNTER — Encounter: Payer: Self-pay | Admitting: Physician Assistant

## 2023-10-19 VITALS — BP 120/75 | HR 83

## 2023-10-19 DIAGNOSIS — R600 Localized edema: Secondary | ICD-10-CM

## 2023-10-19 DIAGNOSIS — R2681 Unsteadiness on feet: Secondary | ICD-10-CM

## 2023-10-19 DIAGNOSIS — G8194 Hemiplegia, unspecified affecting left nondominant side: Secondary | ICD-10-CM | POA: Diagnosis not present

## 2023-10-19 NOTE — Progress Notes (Signed)
 Established patient visit   Patient: Kelly [name]   DOB: 11-21-1950   73 y.o. Female  MRN: [id number] Visit Date: 10/19/2023  Today's healthcare provider: Trenton Frock, PA-C   Cc. Falls, leg swelling  Subjective     Pt reports two falls, one that brought her to the ED 4/10. 2/2 chronic left sided weakness and overall imbalance.  Reports persistent L sided thoracic back pain since the fall, using topical pain patch for relief .  Discussed the use of AI scribe software for clinical note transcription with the patient, who gave verbal consent to proceed.  History of Present Illness     Significant swelling in her feet, especially the left, prevents her from wearing shoes. The swelling is pronounced, painful to touch, and persists despite daily diuretic use. It is primarily in the lower legs, more on the left side, and worsens at night. This is worse than her baseline since her recent fall.  She has been unable to sleep in her bed since the fall, opting for a recliner, which affects her sleep quality. She has difficulty falling asleep until early morning and only gets a couple of hours of rest.  She has not engaged in physical therapy, and an attempt to initiate home therapy was interrupted by her fall.      Medications: Outpatient Medications Prior to Visit  Medication Sig   aspirin  81 MG EC tablet Take 81 mg by mouth daily.   atorvastatin  (LIPITOR) 20 MG tablet TAKE 1 TABLET BY MOUTH DAILY.   benazepril  (LOTENSIN ) 10 MG tablet TAKE 1 TABLET (10 MG TOTAL) BY MOUTH DAILY.   Calcium -Vitamin D -Vitamin K 500-500-40 MG-UNT-MCG CHEW Chew 2 tablets by mouth daily. VIACTIV (Patient not taking: Reported on 09/12/2023)   Cholecalciferol  (VITAMIN D3) 2000 units capsule Take 2,000 Units by mouth daily.   diclofenac  (VOLTAREN ) 75 MG EC tablet Take 1 tablet (75 mg total) by mouth 2 (two) times daily.   famotidine  (PEPCID ) 20 MG tablet TAKE 1 TABLET BY MOUTH 2 TIMES DAILY.    fluticasone  (FLONASE ) 50 MCG/ACT nasal spray Place 2 sprays into both nostrils daily. (Patient not taking: Reported on 09/12/2023)   furosemide  (LASIX ) 40 MG tablet Take 1 tablet (40 mg total) by mouth daily.   gabapentin  (NEURONTIN ) 100 MG capsule TAKE 1 CAPSULE BY MOUTH 2 TIMES DAILY.   lidocaine  (LIDODERM ) 5 % Place 1 patch onto the skin daily. Remove & Discard patch within 12 hours or as directed by MD   potassium chloride  SA (KLOR-CON  M) 20 MEQ tablet TAKE 1 TABLET (20 MEQ TOTAL) BY MOUTH DAILY.   SYNTHROID  75 MCG tablet TAKE 1 TABLET (75 MCG TOTAL) BY MOUTH DAILY BEFORE BREAKFAST.   traMADol  (ULTRAM ) 50 MG tablet Take 1 tablet (50 mg total) by mouth every 6 (six) hours as needed.   Facility-Administered Medications Prior to Visit  Medication Dose Route Frequency Provider   betamethasone  acetate-betamethasone  sodium phosphate  (CELESTONE ) injection 3 mg  3 mg Intra-articular Once Evans, Brent M, DPM    Review of Systems  Constitutional:  Negative for fatigue and fever.  Respiratory:  Negative for cough and shortness of breath.   Cardiovascular:  Positive for leg swelling. Negative for chest pain.  Gastrointestinal:  Negative for abdominal pain.  Musculoskeletal:  Positive for back pain.  Neurological:  Positive for weakness. Negative for dizziness and headaches.       Objective    BP 120/75   Pulse 83    Physical  Exam Constitutional:      General: She is awake.     Appearance: She is well-developed.     Comments: In wheelchair  HENT:     Head: Normocephalic.  Eyes:     Conjunctiva/sclera: Conjunctivae normal.  Cardiovascular:     Rate and Rhythm: Normal rate and regular rhythm.     Heart sounds: Normal heart sounds.  Pulmonary:     Effort: Pulmonary effort is normal.     Breath sounds: Normal breath sounds.  Musculoskeletal:     Comments: 0-1 pitting b/l LE. Swelling on RLE extends to ankle, swelling LEE extends to mid-shin.  Tender to touch, no visible  erythema Appropriately warm w/ no wounds  Skin:    General: Skin is warm.  Neurological:     Mental Status: She is alert and oriented to person, place, and time.  Psychiatric:        Attention and Perception: Attention normal.        Mood and Affect: Mood normal.        Speech: Speech normal.        Behavior: Behavior is cooperative.     No results found for any visits on 10/19/23.  Assessment & Plan    Bilateral lower extremity edema Assessment & Plan: Ok to increase lasix  to 60 mg x 3 days and then back to regular 40 mg daily.  Advised it is 2/2 to sleeping in recliner, less movement over the last month.  No sign of infection  Orders: -     Ambulatory referral to Home Health  Hemiparesis, left [facility]) Assessment & Plan: complicated by obesity, weakness, gait instability and lower extremity edema, high fall risk needing home PT  Orders: -     Ambulatory referral to Home Health  Gait instability Assessment & Plan: Referring to home health PT. Pt has fallen twice in the last month.  Orders: -     Ambulatory referral to Home Health   Return if symptoms worsen or fail to improve.       Trenton Frock, PA-C  [facility] at [facility] [phone] (phone) [phone] (fax)  [facility]

## 2023-10-19 NOTE — Assessment & Plan Note (Signed)
 complicated by obesity, weakness, gait instability and lower extremity edema, high fall risk needing home PT

## 2023-10-19 NOTE — Assessment & Plan Note (Signed)
 Referring to home health PT. Pt has fallen twice in the last month.

## 2023-10-19 NOTE — Assessment & Plan Note (Signed)
 Ok to increase lasix  to 60 mg x 3 days and then back to regular 40 mg daily.  Advised it is 2/2 to sleeping in recliner, less movement over the last month.  No sign of infection

## 2023-10-21 ENCOUNTER — Ambulatory Visit: Payer: Self-pay

## 2023-10-21 ENCOUNTER — Ambulatory Visit: Admitting: Family Medicine

## 2023-10-21 NOTE — Telephone Encounter (Signed)
 Copied from CRM [id number]. Topic: Clinical - Red Word Triage >> Oct 21, 2023  3:56 PM Caliyah H wrote: Red Word that prompted transfer to Nurse Triage: Patient called regarding a prescription for Ibuprofen  600mg .She stated it was last filled on 09/22/23 and mentioned she fell twice on April 5th, during which she was seen at [facility] patient reported receiving a call from the office on 10/19/23 regarding Lasix  instructions - to take one tablet, cut in half for 3 days, then stop. She is currently experiencing left foot pain with persistent swelling, though not as severe as immediately after the fall. She reports keeping the foot elevated. Reviewing the patient's medication list, I did not see her PCP listed as the prescribing provider for Ibuprofen . However, I did find that Ibuprofen  400mg  was prescribed during her ED visit.

## 2023-10-21 NOTE — Telephone Encounter (Signed)
 Reason for Disposition . [1] Swollen foot AND [2] no fever  (Exceptions: localized bump from bunions, calluses, insect bite, sting)  Answer Assessment - Initial Assessment Questions 1. ONSET: "When did the pain start?"      Ongoing since fall in April 2. LOCATION: "Where is the pain located?"      L foot 3. PAIN: "How bad is the pain?"    (Scale 1-10; or mild, moderate, severe)  - MILD (1-3): doesn't interfere with normal activities.   - MODERATE (4-7): interferes with normal activities (e.g., work or school) or awakens from sleep, limping.   - SEVERE (8-10): excruciating pain, unable to do any normal activities, unable to walk.      7 4. WORK OR EXERCISE: "Has there been any recent work or exercise that involved this part of the body?"      denies 5. CAUSE: "What do you think is causing the foot pain?"     Fell and injured the foot in the beginning of april 6. OTHER SYMPTOMS: "Do you have any other symptoms?" (e.g., leg pain, rash, fever, numbness)     Swelling,  Protocols used: Foot Pain-A-AH

## 2023-10-21 NOTE — Telephone Encounter (Signed)
 Pt was seen 5/6 for ankle pain/swelling Chief Complaint: L ankle pain, and ibuprofen  refill Symptoms: pain, swelling Frequency: ongoing since fall in beginning of april Pertinent Negatives: Patient denies fever, redness, difficulty breathing, DM2 Disposition: [] ED /[] Urgent Care (no appt availability in office) / [] Appointment(In office/virtual)/ []  [place] [facility]/ [] Home Care/ [] Refused Recommended Disposition /[] [facility]/ [x]  Follow-up with PCP Additional Notes: Pt states that she was just seen in the clinic 2 days ago for the foot swelling/pain. Pt states that she was advised about lasix , but states she wanted a refill of the ibuprofen . Pt states that she would like a refill of the ibuprofen . Pt states that pain is not worse than when she was last evaluated.

## 2023-10-22 ENCOUNTER — Other Ambulatory Visit: Payer: Self-pay | Admitting: Family Medicine

## 2023-10-22 DIAGNOSIS — M79672 Pain in left foot: Secondary | ICD-10-CM

## 2023-10-22 MED ORDER — IBUPROFEN 200 MG PO CAPS
ORAL_CAPSULE | ORAL | 0 refills | Status: DC
Start: 2023-10-22 — End: 2023-12-03

## 2023-11-01 DIAGNOSIS — I10 Essential (primary) hypertension: Secondary | ICD-10-CM | POA: Diagnosis not present

## 2023-11-01 DIAGNOSIS — Z79899 Other long term (current) drug therapy: Secondary | ICD-10-CM | POA: Diagnosis not present

## 2023-11-01 DIAGNOSIS — E039 Hypothyroidism, unspecified: Secondary | ICD-10-CM | POA: Diagnosis not present

## 2023-11-01 DIAGNOSIS — E559 Vitamin D deficiency, unspecified: Secondary | ICD-10-CM | POA: Diagnosis not present

## 2023-11-01 DIAGNOSIS — Z1159 Encounter for screening for other viral diseases: Secondary | ICD-10-CM | POA: Diagnosis not present

## 2023-11-01 DIAGNOSIS — G8192 Hemiplegia, unspecified affecting left dominant side: Secondary | ICD-10-CM | POA: Diagnosis not present

## 2023-11-01 DIAGNOSIS — G629 Polyneuropathy, unspecified: Secondary | ICD-10-CM | POA: Diagnosis not present

## 2023-11-01 DIAGNOSIS — E785 Hyperlipidemia, unspecified: Secondary | ICD-10-CM | POA: Diagnosis not present

## 2023-11-01 DIAGNOSIS — Z7189 Other specified counseling: Secondary | ICD-10-CM | POA: Diagnosis not present

## 2023-11-01 DIAGNOSIS — Z0001 Encounter for general adult medical examination with abnormal findings: Secondary | ICD-10-CM | POA: Diagnosis not present

## 2023-11-01 DIAGNOSIS — K219 Gastro-esophageal reflux disease without esophagitis: Secondary | ICD-10-CM | POA: Diagnosis not present

## 2023-11-02 ENCOUNTER — Other Ambulatory Visit: Payer: Self-pay | Admitting: Family Medicine

## 2023-11-02 DIAGNOSIS — Z1231 Encounter for screening mammogram for malignant neoplasm of breast: Secondary | ICD-10-CM

## 2023-11-02 LAB — LAB REPORT - SCANNED
EGFR (Non-African Amer.): 101
HM Hepatitis Screen: NEGATIVE

## 2023-11-11 ENCOUNTER — Encounter: Payer: Self-pay | Admitting: Family Medicine

## 2023-11-11 ENCOUNTER — Encounter: Payer: Self-pay | Admitting: Sports Medicine

## 2023-11-11 ENCOUNTER — Ambulatory Visit: Payer: Self-pay | Admitting: *Deleted

## 2023-11-11 ENCOUNTER — Ambulatory Visit (INDEPENDENT_AMBULATORY_CARE_PROVIDER_SITE_OTHER): Admitting: Sports Medicine

## 2023-11-11 ENCOUNTER — Ambulatory Visit (INDEPENDENT_AMBULATORY_CARE_PROVIDER_SITE_OTHER): Admitting: Family Medicine

## 2023-11-11 ENCOUNTER — Ambulatory Visit (HOSPITAL_BASED_OUTPATIENT_CLINIC_OR_DEPARTMENT_OTHER)
Admission: RE | Admit: 2023-11-11 | Discharge: 2023-11-11 | Disposition: A | Discharge status: Home / Self Care | Source: Ambulatory Visit | Attending: Sports Medicine | Admitting: Sports Medicine

## 2023-11-11 VITALS — BP 118/70 | HR 86 | Temp 97.9°F | Resp 18 | Ht 60.0 in

## 2023-11-11 VITALS — BP 120/68 | Ht 60.0 in | Wt 238.0 lb

## 2023-11-11 DIAGNOSIS — W19XXXA Unspecified fall, initial encounter: Secondary | ICD-10-CM

## 2023-11-11 DIAGNOSIS — R0781 Pleurodynia: Secondary | ICD-10-CM

## 2023-11-11 DIAGNOSIS — M25572 Pain in left ankle and joints of left foot: Secondary | ICD-10-CM

## 2023-11-11 DIAGNOSIS — R0789 Other chest pain: Secondary | ICD-10-CM | POA: Insufficient documentation

## 2023-11-11 DIAGNOSIS — M79672 Pain in left foot: Secondary | ICD-10-CM | POA: Diagnosis not present

## 2023-11-11 DIAGNOSIS — M19072 Primary osteoarthritis, left ankle and foot: Secondary | ICD-10-CM | POA: Diagnosis not present

## 2023-11-11 DIAGNOSIS — L0291 Cutaneous abscess, unspecified: Secondary | ICD-10-CM | POA: Insufficient documentation

## 2023-11-11 MED ORDER — CEPHALEXIN 500 MG PO CAPS: 500.0000 mg | ORAL_CAPSULE | Freq: Two times a day (BID) | ORAL | 0 refills | Status: DC

## 2023-11-11 NOTE — Assessment & Plan Note (Signed)
 Abx per orders Bandage with abx ointment  F/u as needed
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
## 2023-11-11 NOTE — Progress Notes (Signed)
   Subjective:    Patient ID: Kelly [name], female    DOB: 06-Jun-1951, 73 y.o.   MRN: [id number]  HPI chief complaint: Left foot and left-sided rib pain  Kelly [name] is a very pleasant 73 year old female that presents today primarily complaining of left foot pain has been present for almost 2 months.  She has hemiparesis of her left side and she stumbled over a rug in her bathroom at that time.  Has had pain along the lateral foot and ankle ever since.  It is slowly improving but not resolving.  She is also complaining of some pain along the left side of her rib cage.  It too is improving but has not resolved.  Pain is most noticeable when taking a deep breath.  Past medical history reviewed Medications reviewed Allergies reviewed  Review of Systems As above    Objective:   Physical Exam  Well-developed, well-nourished.  No acute distress.  Sitting comfortably in her wheelchair.  There is tenderness to palpation along the left mid thorax.  Also tenderness to palpation along the lateral ankle and foot.  Mild diffuse edema throughout the foot, ankle, and lower extremity.  Patient is unable to move her ankle or foot secondary to her hemiparesis.  X-rays of her ribs show old posterior lateral left 4th and 5th rib fractures which are unchanged when compared to x-rays from April 10.  No acute fracture is seen.  X-ray of her right foot and right ankle show no acute abnormality.    Assessment & Plan:   Rib pain status post fall Left foot and ankle pain status post fall Hemiparesis, left side  Reassurance regarding x-rays.  Her pain is improving, albeit slow.  I would look for this to continue to be the case.  She will follow-up with me as needed.  This note was dictated using Dragon naturally speaking software and may contain errors in syntax, spelling, or content which have not been identified prior to signing this note.

## 2023-11-25 ENCOUNTER — Telehealth: Payer: Self-pay

## 2023-11-25 NOTE — Telephone Encounter (Signed)
 Copied from CRM [id number]. Topic: Clinical - Lab/Test Results >> Nov 24, 2023  4:05 PM Baldomero Bone wrote: Reason for CRM: Patient receive a letter from [facility], [phone] or fax [phone], about abnormal lab results. Please reach out to get results so that you can discuss further care. Callback number is [phone].

## 2023-11-29 NOTE — Telephone Encounter (Signed)
 Patient was scheduled for due 6 months follow up

## 2023-11-30 ENCOUNTER — Other Ambulatory Visit: Payer: Self-pay | Admitting: Physician Assistant

## 2023-11-30 DIAGNOSIS — M79672 Pain in left foot: Secondary | ICD-10-CM

## 2023-11-30 DIAGNOSIS — G8194 Hemiplegia, unspecified affecting left nondominant side: Secondary | ICD-10-CM

## 2023-11-30 DIAGNOSIS — R296 Repeated falls: Secondary | ICD-10-CM

## 2023-11-30 DIAGNOSIS — R600 Localized edema: Secondary | ICD-10-CM

## 2023-11-30 DIAGNOSIS — R2681 Unsteadiness on feet: Secondary | ICD-10-CM

## 2023-12-02 ENCOUNTER — Telehealth: Payer: Self-pay

## 2023-12-02 DIAGNOSIS — M1990 Unspecified osteoarthritis, unspecified site: Secondary | ICD-10-CM | POA: Diagnosis not present

## 2023-12-02 DIAGNOSIS — Z86711 Personal history of pulmonary embolism: Secondary | ICD-10-CM | POA: Diagnosis not present

## 2023-12-02 DIAGNOSIS — Z9181 History of falling: Secondary | ICD-10-CM | POA: Diagnosis not present

## 2023-12-02 DIAGNOSIS — Z602 Problems related to living alone: Secondary | ICD-10-CM | POA: Diagnosis not present

## 2023-12-02 DIAGNOSIS — R739 Hyperglycemia, unspecified: Secondary | ICD-10-CM | POA: Diagnosis not present

## 2023-12-02 DIAGNOSIS — M546 Pain in thoracic spine: Secondary | ICD-10-CM | POA: Diagnosis not present

## 2023-12-02 DIAGNOSIS — I83893 Varicose veins of bilateral lower extremities with other complications: Secondary | ICD-10-CM | POA: Diagnosis not present

## 2023-12-02 DIAGNOSIS — M5431 Sciatica, right side: Secondary | ICD-10-CM | POA: Diagnosis not present

## 2023-12-02 DIAGNOSIS — J45909 Unspecified asthma, uncomplicated: Secondary | ICD-10-CM | POA: Diagnosis not present

## 2023-12-02 DIAGNOSIS — Z86718 Personal history of other venous thrombosis and embolism: Secondary | ICD-10-CM | POA: Diagnosis not present

## 2023-12-02 DIAGNOSIS — Z853 Personal history of malignant neoplasm of breast: Secondary | ICD-10-CM | POA: Diagnosis not present

## 2023-12-02 DIAGNOSIS — I10 Essential (primary) hypertension: Secondary | ICD-10-CM | POA: Diagnosis not present

## 2023-12-02 DIAGNOSIS — Z860100 Personal history of colon polyps, unspecified: Secondary | ICD-10-CM | POA: Diagnosis not present

## 2023-12-02 DIAGNOSIS — E785 Hyperlipidemia, unspecified: Secondary | ICD-10-CM | POA: Diagnosis not present

## 2023-12-02 DIAGNOSIS — L919 Hypertrophic disorder of the skin, unspecified: Secondary | ICD-10-CM | POA: Diagnosis not present

## 2023-12-02 DIAGNOSIS — M810 Age-related osteoporosis without current pathological fracture: Secondary | ICD-10-CM | POA: Diagnosis not present

## 2023-12-02 DIAGNOSIS — Z7982 Long term (current) use of aspirin: Secondary | ICD-10-CM | POA: Diagnosis not present

## 2023-12-02 DIAGNOSIS — E039 Hypothyroidism, unspecified: Secondary | ICD-10-CM | POA: Diagnosis not present

## 2023-12-02 DIAGNOSIS — Z96651 Presence of right artificial knee joint: Secondary | ICD-10-CM | POA: Diagnosis not present

## 2023-12-02 DIAGNOSIS — I69354 Hemiplegia and hemiparesis following cerebral infarction affecting left non-dominant side: Secondary | ICD-10-CM | POA: Diagnosis not present

## 2023-12-02 DIAGNOSIS — Z87891 Personal history of nicotine dependence: Secondary | ICD-10-CM | POA: Diagnosis not present

## 2023-12-02 NOTE — Telephone Encounter (Signed)
Returned call. LVM with verbal ?

## 2023-12-02 NOTE — Telephone Encounter (Signed)
 Copied from CRM [id number]. Topic: Clinical - Home Health Verbal Orders >> Dec 02, 2023 10:57 AM Rennis Case wrote: Caller/Agency: [facility] Callback Number: [phone] Service Requested: Physical Therapy Frequency: 1x8  Any new concerns about the patient? Yes, inquiring if provider could send a referral for a wheelchair assessment for a power wheelchair. It has been over 5 years since her last power wheelchair was given to her.  Can send referral to: [facility] - Fax: [phone], Ph: [phone]

## 2023-12-03 ENCOUNTER — Telehealth: Payer: Self-pay

## 2023-12-03 ENCOUNTER — Encounter: Payer: Self-pay | Admitting: Family Medicine

## 2023-12-03 ENCOUNTER — Ambulatory Visit (INDEPENDENT_AMBULATORY_CARE_PROVIDER_SITE_OTHER): Admitting: Family Medicine

## 2023-12-03 VITALS — BP 114/70 | HR 75 | Temp 97.9°F | Resp 18 | Ht 60.0 in

## 2023-12-03 DIAGNOSIS — M79672 Pain in left foot: Secondary | ICD-10-CM

## 2023-12-03 DIAGNOSIS — R739 Hyperglycemia, unspecified: Secondary | ICD-10-CM

## 2023-12-03 DIAGNOSIS — R748 Abnormal levels of other serum enzymes: Secondary | ICD-10-CM

## 2023-12-03 DIAGNOSIS — K429 Umbilical hernia without obstruction or gangrene: Secondary | ICD-10-CM

## 2023-12-03 MED ORDER — IBUPROFEN 600 MG PO TABS: 600.0000 mg | ORAL_TABLET | Freq: Three times a day (TID) | ORAL | 2 refills | Status: AC | PRN

## 2023-12-03 MED ORDER — IBUPROFEN 200 MG PO CAPS
ORAL_CAPSULE | ORAL | 0 refills | Status: DC
Start: 2023-12-03 — End: 2023-12-03

## 2023-12-03 NOTE — Telephone Encounter (Signed)
 Copied from CRM [id number]. Topic: Clinical - Medication Question >> Dec 03, 2023  2:27 PM Martinique E wrote: Reason for CRM: Patient called in stating that PCP sent over a prescription for Ibuprofen  200 MG CAPS which she would have to pay out of pocket for. Patient questioning if this can get changed to 600 MG in order for insurance to cover this medication. Callback number for patient is [phone].

## 2023-12-03 NOTE — Telephone Encounter (Signed)
 Rx sent.

## 2023-12-04 LAB — COMPREHENSIVE METABOLIC PANEL WITH GFR
AG Ratio: 1.6 (calc) (ref 1.0–2.5)
ALT: 11 U/L (ref 6–29)
AST: 14 U/L (ref 10–35)
Albumin: 4 g/dL (ref 3.6–5.1)
Alkaline phosphatase (APISO): 146 U/L (ref 37–153)
BUN/Creatinine Ratio: 26 (calc) — ABNORMAL HIGH (ref 6–22)
BUN: 14 mg/dL (ref 7–25)
CO2: 29 mmol/L (ref 20–32)
Calcium: 9.1 mg/dL (ref 8.6–10.4)
Chloride: 103 mmol/L (ref 98–110)
Creat: 0.54 mg/dL — ABNORMAL LOW (ref 0.60–1.00)
Globulin: 2.5 g/dL (ref 1.9–3.7)
Glucose, Bld: 118 mg/dL — ABNORMAL HIGH (ref 65–99)
Potassium: 4.3 mmol/L (ref 3.5–5.3)
Sodium: 143 mmol/L (ref 135–146)
Total Bilirubin: 1 mg/dL (ref 0.2–1.2)
Total Protein: 6.5 g/dL (ref 6.1–8.1)
eGFR: 97 mL/min/{1.73_m2} (ref >=60)

## 2023-12-04 LAB — HEMOGLOBIN A1C
Hgb A1c MFr Bld: 5.8 % — ABNORMAL HIGH (ref <5.7)
Mean Plasma Glucose: 120 mg/dL
eAG (mmol/L): 6.6 mmol/L
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
## 2023-12-07 DIAGNOSIS — Z7982 Long term (current) use of aspirin: Secondary | ICD-10-CM | POA: Diagnosis not present

## 2023-12-07 DIAGNOSIS — Z853 Personal history of malignant neoplasm of breast: Secondary | ICD-10-CM | POA: Diagnosis not present

## 2023-12-07 DIAGNOSIS — L919 Hypertrophic disorder of the skin, unspecified: Secondary | ICD-10-CM | POA: Diagnosis not present

## 2023-12-07 DIAGNOSIS — Z87891 Personal history of nicotine dependence: Secondary | ICD-10-CM | POA: Diagnosis not present

## 2023-12-07 DIAGNOSIS — M5431 Sciatica, right side: Secondary | ICD-10-CM | POA: Diagnosis not present

## 2023-12-07 DIAGNOSIS — Z86718 Personal history of other venous thrombosis and embolism: Secondary | ICD-10-CM | POA: Diagnosis not present

## 2023-12-07 DIAGNOSIS — Z602 Problems related to living alone: Secondary | ICD-10-CM | POA: Diagnosis not present

## 2023-12-07 DIAGNOSIS — Z860100 Personal history of colon polyps, unspecified: Secondary | ICD-10-CM | POA: Diagnosis not present

## 2023-12-07 DIAGNOSIS — I83893 Varicose veins of bilateral lower extremities with other complications: Secondary | ICD-10-CM | POA: Diagnosis not present

## 2023-12-07 DIAGNOSIS — R739 Hyperglycemia, unspecified: Secondary | ICD-10-CM | POA: Diagnosis not present

## 2023-12-07 DIAGNOSIS — E785 Hyperlipidemia, unspecified: Secondary | ICD-10-CM | POA: Diagnosis not present

## 2023-12-07 DIAGNOSIS — J45909 Unspecified asthma, uncomplicated: Secondary | ICD-10-CM | POA: Diagnosis not present

## 2023-12-07 DIAGNOSIS — Z96651 Presence of right artificial knee joint: Secondary | ICD-10-CM | POA: Diagnosis not present

## 2023-12-07 DIAGNOSIS — Z9181 History of falling: Secondary | ICD-10-CM | POA: Diagnosis not present

## 2023-12-07 DIAGNOSIS — E039 Hypothyroidism, unspecified: Secondary | ICD-10-CM | POA: Diagnosis not present

## 2023-12-07 DIAGNOSIS — I69354 Hemiplegia and hemiparesis following cerebral infarction affecting left non-dominant side: Secondary | ICD-10-CM | POA: Diagnosis not present

## 2023-12-07 DIAGNOSIS — M546 Pain in thoracic spine: Secondary | ICD-10-CM | POA: Diagnosis not present

## 2023-12-07 DIAGNOSIS — I10 Essential (primary) hypertension: Secondary | ICD-10-CM | POA: Diagnosis not present

## 2023-12-07 DIAGNOSIS — M1990 Unspecified osteoarthritis, unspecified site: Secondary | ICD-10-CM | POA: Diagnosis not present

## 2023-12-07 DIAGNOSIS — Z86711 Personal history of pulmonary embolism: Secondary | ICD-10-CM | POA: Diagnosis not present

## 2023-12-07 DIAGNOSIS — M810 Age-related osteoporosis without current pathological fracture: Secondary | ICD-10-CM | POA: Diagnosis not present

## 2023-12-08 LAB — ALKALINE PHOSPHATASE, ISOENZYMES
Alkaline Phosphatase: 175 IU/L — ABNORMAL HIGH (ref 44–121)
BONE FRACTION: 31 % (ref 14–68)
INTESTINAL FRAC.: 1 % (ref 0–18)
LIVER FRACTION: 68 % (ref 18–85)

## 2023-12-12 ENCOUNTER — Ambulatory Visit: Payer: Self-pay | Admitting: Family Medicine

## 2023-12-14 NOTE — Telephone Encounter (Signed)
 Pt was just seen on 06/20. Would you still like a appointment?

## 2023-12-15 ENCOUNTER — Encounter: Payer: Self-pay | Admitting: Family Medicine

## 2023-12-15 DIAGNOSIS — Z602 Problems related to living alone: Secondary | ICD-10-CM | POA: Diagnosis not present

## 2023-12-15 DIAGNOSIS — E039 Hypothyroidism, unspecified: Secondary | ICD-10-CM | POA: Diagnosis not present

## 2023-12-15 DIAGNOSIS — I69354 Hemiplegia and hemiparesis following cerebral infarction affecting left non-dominant side: Secondary | ICD-10-CM | POA: Diagnosis not present

## 2023-12-15 DIAGNOSIS — J45909 Unspecified asthma, uncomplicated: Secondary | ICD-10-CM | POA: Diagnosis not present

## 2023-12-15 DIAGNOSIS — E785 Hyperlipidemia, unspecified: Secondary | ICD-10-CM | POA: Diagnosis not present

## 2023-12-15 DIAGNOSIS — Z7982 Long term (current) use of aspirin: Secondary | ICD-10-CM | POA: Diagnosis not present

## 2023-12-15 DIAGNOSIS — M810 Age-related osteoporosis without current pathological fracture: Secondary | ICD-10-CM | POA: Diagnosis not present

## 2023-12-15 DIAGNOSIS — Z860100 Personal history of colon polyps, unspecified: Secondary | ICD-10-CM | POA: Diagnosis not present

## 2023-12-15 DIAGNOSIS — Z87891 Personal history of nicotine dependence: Secondary | ICD-10-CM | POA: Diagnosis not present

## 2023-12-15 DIAGNOSIS — M546 Pain in thoracic spine: Secondary | ICD-10-CM | POA: Diagnosis not present

## 2023-12-15 DIAGNOSIS — I10 Essential (primary) hypertension: Secondary | ICD-10-CM | POA: Diagnosis not present

## 2023-12-15 DIAGNOSIS — Z853 Personal history of malignant neoplasm of breast: Secondary | ICD-10-CM | POA: Diagnosis not present

## 2023-12-15 DIAGNOSIS — I83893 Varicose veins of bilateral lower extremities with other complications: Secondary | ICD-10-CM | POA: Diagnosis not present

## 2023-12-15 DIAGNOSIS — M5431 Sciatica, right side: Secondary | ICD-10-CM | POA: Diagnosis not present

## 2023-12-15 DIAGNOSIS — Z86718 Personal history of other venous thrombosis and embolism: Secondary | ICD-10-CM | POA: Diagnosis not present

## 2023-12-15 DIAGNOSIS — L919 Hypertrophic disorder of the skin, unspecified: Secondary | ICD-10-CM | POA: Diagnosis not present

## 2023-12-15 DIAGNOSIS — M1990 Unspecified osteoarthritis, unspecified site: Secondary | ICD-10-CM | POA: Diagnosis not present

## 2023-12-15 DIAGNOSIS — Z9181 History of falling: Secondary | ICD-10-CM | POA: Diagnosis not present

## 2023-12-15 DIAGNOSIS — Z86711 Personal history of pulmonary embolism: Secondary | ICD-10-CM | POA: Diagnosis not present

## 2023-12-15 DIAGNOSIS — R739 Hyperglycemia, unspecified: Secondary | ICD-10-CM | POA: Diagnosis not present

## 2023-12-15 DIAGNOSIS — Z96651 Presence of right artificial knee joint: Secondary | ICD-10-CM | POA: Diagnosis not present

## 2023-12-16 ENCOUNTER — Other Ambulatory Visit: Payer: Self-pay | Admitting: Family Medicine

## 2023-12-16 ENCOUNTER — Telehealth: Payer: Self-pay

## 2023-12-16 DIAGNOSIS — I10 Essential (primary) hypertension: Secondary | ICD-10-CM

## 2023-12-16 NOTE — Telephone Encounter (Signed)
 Results not reviewed by pcp yet

## 2023-12-16 NOTE — Telephone Encounter (Signed)
 Copied from CRM [id number]. Topic: Clinical - Lab/Test Results >> Dec 15, 2023  3:13 PM Burnard DEL wrote: Reason for CRM: Patient is requesting a phone call regarding lab results.She stated that she's sutre why her granddaughter is being communicated with but she would like to know what's going herself as well.

## 2023-12-21 DIAGNOSIS — I69354 Hemiplegia and hemiparesis following cerebral infarction affecting left non-dominant side: Secondary | ICD-10-CM | POA: Diagnosis not present

## 2023-12-21 DIAGNOSIS — Z87891 Personal history of nicotine dependence: Secondary | ICD-10-CM | POA: Diagnosis not present

## 2023-12-21 DIAGNOSIS — Z9181 History of falling: Secondary | ICD-10-CM | POA: Diagnosis not present

## 2023-12-21 DIAGNOSIS — Z86711 Personal history of pulmonary embolism: Secondary | ICD-10-CM | POA: Diagnosis not present

## 2023-12-21 DIAGNOSIS — Z96651 Presence of right artificial knee joint: Secondary | ICD-10-CM | POA: Diagnosis not present

## 2023-12-21 DIAGNOSIS — Z602 Problems related to living alone: Secondary | ICD-10-CM | POA: Diagnosis not present

## 2023-12-21 DIAGNOSIS — J45909 Unspecified asthma, uncomplicated: Secondary | ICD-10-CM | POA: Diagnosis not present

## 2023-12-21 DIAGNOSIS — Z86718 Personal history of other venous thrombosis and embolism: Secondary | ICD-10-CM | POA: Diagnosis not present

## 2023-12-21 DIAGNOSIS — Z7982 Long term (current) use of aspirin: Secondary | ICD-10-CM | POA: Diagnosis not present

## 2023-12-21 DIAGNOSIS — M810 Age-related osteoporosis without current pathological fracture: Secondary | ICD-10-CM | POA: Diagnosis not present

## 2023-12-21 DIAGNOSIS — M5431 Sciatica, right side: Secondary | ICD-10-CM | POA: Diagnosis not present

## 2023-12-21 DIAGNOSIS — Z860100 Personal history of colon polyps, unspecified: Secondary | ICD-10-CM | POA: Diagnosis not present

## 2023-12-21 DIAGNOSIS — Z853 Personal history of malignant neoplasm of breast: Secondary | ICD-10-CM | POA: Diagnosis not present

## 2023-12-21 DIAGNOSIS — M1990 Unspecified osteoarthritis, unspecified site: Secondary | ICD-10-CM | POA: Diagnosis not present

## 2023-12-21 DIAGNOSIS — L919 Hypertrophic disorder of the skin, unspecified: Secondary | ICD-10-CM | POA: Diagnosis not present

## 2023-12-21 DIAGNOSIS — R739 Hyperglycemia, unspecified: Secondary | ICD-10-CM | POA: Diagnosis not present

## 2023-12-21 DIAGNOSIS — M546 Pain in thoracic spine: Secondary | ICD-10-CM | POA: Diagnosis not present

## 2023-12-21 DIAGNOSIS — I10 Essential (primary) hypertension: Secondary | ICD-10-CM | POA: Diagnosis not present

## 2023-12-21 DIAGNOSIS — I83893 Varicose veins of bilateral lower extremities with other complications: Secondary | ICD-10-CM | POA: Diagnosis not present

## 2023-12-21 DIAGNOSIS — E785 Hyperlipidemia, unspecified: Secondary | ICD-10-CM | POA: Diagnosis not present

## 2023-12-21 DIAGNOSIS — E039 Hypothyroidism, unspecified: Secondary | ICD-10-CM | POA: Diagnosis not present

## 2023-12-27 DIAGNOSIS — Z96651 Presence of right artificial knee joint: Secondary | ICD-10-CM | POA: Diagnosis not present

## 2023-12-27 DIAGNOSIS — Z602 Problems related to living alone: Secondary | ICD-10-CM | POA: Diagnosis not present

## 2023-12-27 DIAGNOSIS — Z7982 Long term (current) use of aspirin: Secondary | ICD-10-CM | POA: Diagnosis not present

## 2023-12-27 DIAGNOSIS — M5431 Sciatica, right side: Secondary | ICD-10-CM | POA: Diagnosis not present

## 2023-12-27 DIAGNOSIS — Z86718 Personal history of other venous thrombosis and embolism: Secondary | ICD-10-CM | POA: Diagnosis not present

## 2023-12-27 DIAGNOSIS — M546 Pain in thoracic spine: Secondary | ICD-10-CM | POA: Diagnosis not present

## 2023-12-27 DIAGNOSIS — E039 Hypothyroidism, unspecified: Secondary | ICD-10-CM | POA: Diagnosis not present

## 2023-12-27 DIAGNOSIS — I10 Essential (primary) hypertension: Secondary | ICD-10-CM | POA: Diagnosis not present

## 2023-12-27 DIAGNOSIS — Z87891 Personal history of nicotine dependence: Secondary | ICD-10-CM | POA: Diagnosis not present

## 2023-12-27 DIAGNOSIS — J45909 Unspecified asthma, uncomplicated: Secondary | ICD-10-CM | POA: Diagnosis not present

## 2023-12-27 DIAGNOSIS — R739 Hyperglycemia, unspecified: Secondary | ICD-10-CM | POA: Diagnosis not present

## 2023-12-27 DIAGNOSIS — Z853 Personal history of malignant neoplasm of breast: Secondary | ICD-10-CM | POA: Diagnosis not present

## 2023-12-27 DIAGNOSIS — Z86711 Personal history of pulmonary embolism: Secondary | ICD-10-CM | POA: Diagnosis not present

## 2023-12-27 DIAGNOSIS — I69354 Hemiplegia and hemiparesis following cerebral infarction affecting left non-dominant side: Secondary | ICD-10-CM | POA: Diagnosis not present

## 2023-12-27 DIAGNOSIS — Z860100 Personal history of colon polyps, unspecified: Secondary | ICD-10-CM | POA: Diagnosis not present

## 2023-12-27 DIAGNOSIS — Z9181 History of falling: Secondary | ICD-10-CM | POA: Diagnosis not present

## 2023-12-27 DIAGNOSIS — M1990 Unspecified osteoarthritis, unspecified site: Secondary | ICD-10-CM | POA: Diagnosis not present

## 2023-12-27 DIAGNOSIS — L919 Hypertrophic disorder of the skin, unspecified: Secondary | ICD-10-CM | POA: Diagnosis not present

## 2023-12-27 DIAGNOSIS — M810 Age-related osteoporosis without current pathological fracture: Secondary | ICD-10-CM | POA: Diagnosis not present

## 2023-12-27 DIAGNOSIS — I83893 Varicose veins of bilateral lower extremities with other complications: Secondary | ICD-10-CM | POA: Diagnosis not present

## 2023-12-27 DIAGNOSIS — E785 Hyperlipidemia, unspecified: Secondary | ICD-10-CM | POA: Diagnosis not present

## 2024-01-04 ENCOUNTER — Other Ambulatory Visit: Payer: Self-pay

## 2024-01-04 ENCOUNTER — Encounter: Payer: Self-pay | Admitting: Physical Therapy

## 2024-01-04 ENCOUNTER — Ambulatory Visit: Attending: Family Medicine | Admitting: Physical Therapy

## 2024-01-04 DIAGNOSIS — R2689 Other abnormalities of gait and mobility: Secondary | ICD-10-CM | POA: Diagnosis not present

## 2024-01-04 DIAGNOSIS — R29818 Other symptoms and signs involving the nervous system: Secondary | ICD-10-CM | POA: Insufficient documentation

## 2024-01-04 DIAGNOSIS — M6281 Muscle weakness (generalized): Secondary | ICD-10-CM | POA: Diagnosis not present

## 2024-01-04 NOTE — Therapy (Addendum)
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 control [] control of tone/spasticity [] accommodation of range of motion [] UE functional control [] accommodation for seating system []                                          [] provide lateral trunk support [] accommodate deformity [x] provide posterior trunk support [x] provide lumbar/sacral support [x] support trunk in midline [x] Pressure relief over spinal processes  [x]  Seat Cushion      Solution skin protection & positioning                 [x] impaired sensation  [] decubitus ulcers present [] history of pressure ulceration [x] prevent pelvic extension [x] low maintenance  [x] stabilize pelvis  [] accommodate obliquity [] accommodate multiple deformity [x] neutralize lower extremity position [x] increase pressure distribution [x]  At risk with prolonged sitting                                    []  Pelvic/thigh support  []  Lateral thigh guide []  Distal medial pad  []  Distal lateral pad []  pelvis in neutral [] accommodate pelvis []  position upper legs []  alignment []  accommodate ROM []  decrease adduction [] accommodate tone [] removable for transfers [] decrease abduction  []  Lateral trunk Supports []  Lt     []  Rt [] decrease lateral trunk leaning [] control tone [] contour for increased contact [] safety  [] accommodate asymmetry []                                                 [x]  Mounting hardware  [] lateral trunk supports  [] back   [] seat [x] headrest      []  thigh support [] fixed   [] swing away [] attach seat platform/cushion to w/c frame [] attach back cushion to w/c frame [] mount postural supports [x] mount headrest  [] swing medial thigh support away [] swing lateral supports away for transfers  []                                                     Armrests  [] fixed [x] adjustable height [] removable   []   swing away  [x] flip back   [] reclining [x] full length pads [] desk    [] pads tubular  [x] provide support with elbow at 90   [] provide support for w/c tray [x] change of height/angles for variable activities [] remove for transfers [x] allow to come closer to table top [x] remove for access to tables []                                               Hangers/ Leg rests  [] 60 [] 70 [] 90 [x] elevating [] heavy duty  [x] articulating [] fixed [] lift off [] swing away     [x] power [x] provide LE support  [] accommodate to hamstring tightness [x] elevate legs during recline   [x] provide change in position for Legs [] Maintain placement of feet on footplate [] durability [] enable transfers [x] decrease edema [] Accommodate lower leg length []                                         Foot support Footplate    [x] Lt  [x]  Rt  []  Center mount [x] flip up                            [x] depth/angle adjustable [] Amputee adapter    []  Lt     []  Rt [x] provide foot  support [x] accommodate to ankle ROM [x] transfers [] Provide support for residual extremity []  allow foot to go under wheelchair base []  decrease tone  []                                                 []  Ankle strap/heel loops [] support foot on foot support [] decrease extraneous movement [] provide input to heel  [] protect foot  Tires: [] pneumatic  [x] flat free inserts  [] solid  [x] decrease maintenance  [x] prevent frequent flats [] increase shock absorbency [] decrease pain from road shock [] decrease spasms from road shock []                                              [x]  Headrest  [] provide posterior head support [] provide posterior neck support [] provide lateral head support [] provide anterior head support [x] support during tilt and recline [] improve feeding   [] improve respiration [] placement of switches [x] safety  [] accommodate ROM  [] accommodate tone [] improve visual orientation  []  Anterior chest strap []  Vest []  Shoulder retractors  [] decrease forward movement of shoulder [] accommodation of TLSO [] decrease forward movement of trunk [] decrease shoulder elevation [] added abdominal support [] alignment [] assistance with shoulder control  []                                               Pelvic Positioner [x] Belt [] SubASIS bar [] Dual Pull [] stabilize tone [x] decrease falling out of chair/ **will not Decrease potential for sliding due to pelvic tilting [] prevent excessive rotation [] pad for protection over boney prominence [] prominence comfort [] special pull angle to control rotation []   Upper ExtremitySupport  [] L   []  R [] Arm trough   [] hand support []  tray       [] full tray [] swivel mount [] decrease edema      [] decrease subluxation   [] control tone   [] placement for AAC/Computer/EADL [] decrease gravitational pull on shoulders [] provide midline positioning [] provide support to increase UE function [] provide hand  support in natural position [] provide work surface   POWER WHEELCHAIR CONTROLS  [x] Proportional  [] Non-Proportional Type                                      [] Left  [x] Right [x] provides access for controlling wheelchair   [] lacks motor control to operate proportional drive control [] unable to understand proportional controls  Actuator Control Module  [] Single  [x] Multiple   [x] Allow the client to operate the power seat function(s) through the joystick control   [] Safety Reset Switches [] Used to change modes and stop the wheelchair when driving in latch mode    [x] Upgraded Electronics   [x] programming for accurate control [] progressive Disease/changing condition [] non-proportional drive control needed [x] Needed in order to operate power seat functions through joystick control   [] Display box [] Allows user to see in which mode and drive the wheelchair is set  [] necessary for alternate controls    [] Digital interface electronics [] Allows w/c to operate when using alternative drive controls  [] ASL Head Array [] Allows client to operate wheelchair  through switches placed in tri-panel headrest  [] Sip and puff with tubing kit [] needed to operate sip and puff drive controls  [] Upgraded tracking electronics [] increase safety when driving [] correct tracking when on uneven surfaces  [x] Mount for switches or joystick [x] Attaches switches to w/c  [x] Swing away for access or transfers [] midline for optimal placement [x] provides for consistent access  [] Attendant controlled joystick plus mount [] safety [] long distance driving [] operation of seat functions [] compliance with transportation regulations []                                             Rear wheel placement/Axle adjustability [] None [] semi adjustable [] fully adjustable  [] improved UE access to wheels [] improved stability [] changing angle in space for improvement of postural stability [] 1-arm drive access [] amputee pad placement []                                 Wheel rims/ hand rims  [] metal   [] plastic coated [] oblique projections           [] vertical projections [] Provide ability to propel manual wheelchair  []  Increase self-propulsion with hand weakness/decreased grasp  Push handles [] extended   [] angle adjustable              [] standard [] caregiver access [] caregiver assist [] allows "hooking" to enable increased ability to perform ADLs or maintain balance  One armed device   [] Lt   [] Rt [] enable propulsion of manual wheelchair with one arm   []                                            Brake/wheel lock extension []  Lt   []  Rt [] increase indep in applying wheel locks   [] Side guards [] prevent  clothing getting caught in wheel or becoming soiled []  prevent skin tears/abrasions  Battery:     group 22 x 2                                        [x] to power wheelchair                                                         Other:    Power seat elevator                                 [x]   Assistance with transfers [x]  Reach sink for hygiene [x]  Countertops for meal prep                                                                             The above equipment has a life- long use expectancy. Growth and changes in medical and/or functional conditions would be the exceptions. This is to certify that the therapist has no financial relationship with durable medical provider or manufacturer. The therapist will not receive remuneration of any kind for the equipment recommended in this evaluation.   Patient has mobility limitation that significantly impairs safe, timely participation in one or more mobility related ADL's. (bathing, toileting, feeding, dressing, grooming, moving from room to room)  [x]  Yes []  No  Will mobility device sufficiently improve ability to participate and/or be aided in participation of MRADL's?      [x]  Yes []  No  Can limitation be compensated for with use of a cane or walker?                                     []  Yes [x]  No  Does patient or caregiver demonstrate ability/potential ability & willingness to safely use the mobility device?    [x]  Yes []  No  Does patient's home environment support use of recommended mobility device?            [x]  Yes []  No  Does patient have sufficient upper extremity function necessary to functionally propel a manual wheelchair?     []  Yes [x]  No  Does patient have sufficient strength and trunk stability to safely operate a POV (scooter)?                                  []  Yes [x]  No  Does patient need additional features/benefits provided by a power wheelchair for MRADL's in the home?        [x]  Yes []  No  Does the patient demonstrate the ability to safely use a power wheelchair?                   [x]  Yes []   No     Physician's Name Printed:      Jamee Antonio Meth, DO                                                  Physician's Signature:  Date:     This is to certify that I, the above signed therapist have the following affiliations: []  This DME provider []  Manufacturer of recommended equipment []  Patient's long term care facility [x]  None of the above  Therapist Name/Signature:  Daved Bull, PT, DPT                                       Date:  01/04/2024  ASSESSMENT:  CLINICAL IMPRESSION: Patient is a 73 y.o. lady who was seen today for physical therapy evaluation and treatment for power wheelchair with recommendation for power seat elevator, power tilt, and power elevating leg rests.   PLAN:  PT FREQUENCY: 1x/week  PT DURATION: 1 week  PLANNED INTERVENTIONS: initial evaluation only for power wheelchair.  PLAN FOR NEXT SESSION: N/A - eval only   Daved KATHEE Bull, PT, DPT 01/04/2024, 4:43 PM

## 2024-01-17 NOTE — Addendum Note (Signed)
 Addended by: ULYSES SENSOR B on: 01/17/2024 09:40 PM   Modules accepted: Orders

## 2024-01-18 ENCOUNTER — Ambulatory Visit

## 2024-01-18 ENCOUNTER — Ambulatory Visit
Admission: RE | Admit: 2024-01-18 | Discharge: 2024-01-18 | Disposition: A | Discharge status: Home / Self Care | Source: Ambulatory Visit | Attending: Family Medicine | Admitting: Family Medicine

## 2024-01-18 ENCOUNTER — Telehealth: Payer: Self-pay | Admitting: Family Medicine

## 2024-01-18 DIAGNOSIS — G8194 Hemiplegia, unspecified affecting left nondominant side: Secondary | ICD-10-CM

## 2024-01-18 DIAGNOSIS — Z1231 Encounter for screening mammogram for malignant neoplasm of breast: Secondary | ICD-10-CM | POA: Diagnosis not present

## 2024-01-18 DIAGNOSIS — R600 Localized edema: Secondary | ICD-10-CM

## 2024-01-18 DIAGNOSIS — Z7409 Other reduced mobility: Secondary | ICD-10-CM

## 2024-01-18 DIAGNOSIS — R296 Repeated falls: Secondary | ICD-10-CM

## 2024-01-18 DIAGNOSIS — R2681 Unsteadiness on feet: Secondary | ICD-10-CM

## 2024-01-18 NOTE — Telephone Encounter (Signed)
 Okay to place new referral?

## 2024-01-18 NOTE — Telephone Encounter (Signed)
 Copied from CRM [id number]. Topic: Referral - Request for Referral >> Jan 18, 2024  2:06 PM Suzen RAMAN wrote: Did the patient discuss referral with their provider in the last year? Yes (If No - schedule appointment) (If Yes - send message)  Appointment offered? No  Type of order/referral and detailed reason for visit: Home Health  Preference of office, provider, location: [facility]   If referral order, have you been seen by this specialty before? Yes (If Yes, this issue or another issue? When? Where?  Can we respond through MyChart? Yes

## 2024-01-19 NOTE — Addendum Note (Signed)
 Addended by: ELOUISE POWELL HERO on: 01/19/2024 01:49 PM   Modules accepted: Orders

## 2024-01-19 NOTE — Telephone Encounter (Signed)
 Spoke with patient. Pt needed a new home health referral. Referral placed

## 2024-02-28 ENCOUNTER — Other Ambulatory Visit: Payer: Self-pay | Admitting: Family Medicine

## 2024-02-28 DIAGNOSIS — I10 Essential (primary) hypertension: Secondary | ICD-10-CM

## 2024-03-03 ENCOUNTER — Telehealth: Payer: Self-pay

## 2024-03-03 DIAGNOSIS — Z86718 Personal history of other venous thrombosis and embolism: Secondary | ICD-10-CM | POA: Diagnosis not present

## 2024-03-03 DIAGNOSIS — M5431 Sciatica, right side: Secondary | ICD-10-CM | POA: Diagnosis not present

## 2024-03-03 DIAGNOSIS — J45909 Unspecified asthma, uncomplicated: Secondary | ICD-10-CM | POA: Diagnosis not present

## 2024-03-03 DIAGNOSIS — Z87891 Personal history of nicotine dependence: Secondary | ICD-10-CM | POA: Diagnosis not present

## 2024-03-03 DIAGNOSIS — G8194 Hemiplegia, unspecified affecting left nondominant side: Secondary | ICD-10-CM | POA: Diagnosis not present

## 2024-03-03 DIAGNOSIS — Z86711 Personal history of pulmonary embolism: Secondary | ICD-10-CM | POA: Diagnosis not present

## 2024-03-03 DIAGNOSIS — M546 Pain in thoracic spine: Secondary | ICD-10-CM | POA: Diagnosis not present

## 2024-03-03 DIAGNOSIS — R739 Hyperglycemia, unspecified: Secondary | ICD-10-CM | POA: Diagnosis not present

## 2024-03-03 DIAGNOSIS — E785 Hyperlipidemia, unspecified: Secondary | ICD-10-CM | POA: Diagnosis not present

## 2024-03-03 DIAGNOSIS — R296 Repeated falls: Secondary | ICD-10-CM | POA: Diagnosis not present

## 2024-03-03 DIAGNOSIS — Z853 Personal history of malignant neoplasm of breast: Secondary | ICD-10-CM | POA: Diagnosis not present

## 2024-03-03 DIAGNOSIS — Z604 Social exclusion and rejection: Secondary | ICD-10-CM | POA: Diagnosis not present

## 2024-03-03 DIAGNOSIS — I10 Essential (primary) hypertension: Secondary | ICD-10-CM | POA: Diagnosis not present

## 2024-03-03 DIAGNOSIS — E039 Hypothyroidism, unspecified: Secondary | ICD-10-CM | POA: Diagnosis not present

## 2024-03-03 DIAGNOSIS — Z7982 Long term (current) use of aspirin: Secondary | ICD-10-CM | POA: Diagnosis not present

## 2024-03-03 DIAGNOSIS — K429 Umbilical hernia without obstruction or gangrene: Secondary | ICD-10-CM | POA: Diagnosis not present

## 2024-03-03 DIAGNOSIS — Z96651 Presence of right artificial knee joint: Secondary | ICD-10-CM | POA: Diagnosis not present

## 2024-03-03 DIAGNOSIS — M810 Age-related osteoporosis without current pathological fracture: Secondary | ICD-10-CM | POA: Diagnosis not present

## 2024-03-03 NOTE — Telephone Encounter (Signed)
 Copied from CRM #[id number]. Topic: Clinical - Home Health Verbal Orders >> Mar 03, 2024  3:04 PM Robinson DEL wrote: Caller/Agency: Brian/[facility] Callback Number: [phone] Secure Line Service Requested: Physical Therapy Frequency: 1 week 9 Any new concerns about the patient? No

## 2024-03-06 NOTE — Telephone Encounter (Signed)
 Verbal given

## 2024-03-10 DIAGNOSIS — M810 Age-related osteoporosis without current pathological fracture: Secondary | ICD-10-CM | POA: Diagnosis not present

## 2024-03-10 DIAGNOSIS — Z7982 Long term (current) use of aspirin: Secondary | ICD-10-CM | POA: Diagnosis not present

## 2024-03-10 DIAGNOSIS — Z853 Personal history of malignant neoplasm of breast: Secondary | ICD-10-CM | POA: Diagnosis not present

## 2024-03-10 DIAGNOSIS — Z96651 Presence of right artificial knee joint: Secondary | ICD-10-CM | POA: Diagnosis not present

## 2024-03-10 DIAGNOSIS — E785 Hyperlipidemia, unspecified: Secondary | ICD-10-CM | POA: Diagnosis not present

## 2024-03-10 DIAGNOSIS — G8194 Hemiplegia, unspecified affecting left nondominant side: Secondary | ICD-10-CM | POA: Diagnosis not present

## 2024-03-10 DIAGNOSIS — J45909 Unspecified asthma, uncomplicated: Secondary | ICD-10-CM | POA: Diagnosis not present

## 2024-03-10 DIAGNOSIS — K429 Umbilical hernia without obstruction or gangrene: Secondary | ICD-10-CM | POA: Diagnosis not present

## 2024-03-10 DIAGNOSIS — Z86711 Personal history of pulmonary embolism: Secondary | ICD-10-CM | POA: Diagnosis not present

## 2024-03-10 DIAGNOSIS — R296 Repeated falls: Secondary | ICD-10-CM | POA: Diagnosis not present

## 2024-03-10 DIAGNOSIS — Z604 Social exclusion and rejection: Secondary | ICD-10-CM | POA: Diagnosis not present

## 2024-03-10 DIAGNOSIS — M546 Pain in thoracic spine: Secondary | ICD-10-CM | POA: Diagnosis not present

## 2024-03-10 DIAGNOSIS — M5431 Sciatica, right side: Secondary | ICD-10-CM | POA: Diagnosis not present

## 2024-03-10 DIAGNOSIS — R739 Hyperglycemia, unspecified: Secondary | ICD-10-CM | POA: Diagnosis not present

## 2024-03-10 DIAGNOSIS — E039 Hypothyroidism, unspecified: Secondary | ICD-10-CM | POA: Diagnosis not present

## 2024-03-10 DIAGNOSIS — Z87891 Personal history of nicotine dependence: Secondary | ICD-10-CM | POA: Diagnosis not present

## 2024-03-10 DIAGNOSIS — Z86718 Personal history of other venous thrombosis and embolism: Secondary | ICD-10-CM | POA: Diagnosis not present

## 2024-03-10 DIAGNOSIS — I10 Essential (primary) hypertension: Secondary | ICD-10-CM | POA: Diagnosis not present

## 2024-03-13 ENCOUNTER — Encounter: Payer: Self-pay | Admitting: Family Medicine

## 2024-03-13 ENCOUNTER — Ambulatory Visit: Admitting: Family Medicine

## 2024-03-13 VITALS — BP 98/70 | HR 88 | Temp 98.0°F | Resp 18 | Ht 60.0 in

## 2024-03-13 DIAGNOSIS — E785 Hyperlipidemia, unspecified: Secondary | ICD-10-CM | POA: Diagnosis not present

## 2024-03-13 DIAGNOSIS — M5431 Sciatica, right side: Secondary | ICD-10-CM | POA: Diagnosis not present

## 2024-03-13 DIAGNOSIS — R739 Hyperglycemia, unspecified: Secondary | ICD-10-CM | POA: Diagnosis not present

## 2024-03-13 DIAGNOSIS — Z86718 Personal history of other venous thrombosis and embolism: Secondary | ICD-10-CM | POA: Diagnosis not present

## 2024-03-13 DIAGNOSIS — R159 Full incontinence of feces: Secondary | ICD-10-CM

## 2024-03-13 DIAGNOSIS — Z853 Personal history of malignant neoplasm of breast: Secondary | ICD-10-CM | POA: Diagnosis not present

## 2024-03-13 DIAGNOSIS — M810 Age-related osteoporosis without current pathological fracture: Secondary | ICD-10-CM | POA: Diagnosis not present

## 2024-03-13 DIAGNOSIS — M546 Pain in thoracic spine: Secondary | ICD-10-CM | POA: Diagnosis not present

## 2024-03-13 DIAGNOSIS — E039 Hypothyroidism, unspecified: Secondary | ICD-10-CM

## 2024-03-13 DIAGNOSIS — G8194 Hemiplegia, unspecified affecting left nondominant side: Secondary | ICD-10-CM | POA: Diagnosis not present

## 2024-03-13 DIAGNOSIS — J45909 Unspecified asthma, uncomplicated: Secondary | ICD-10-CM | POA: Diagnosis not present

## 2024-03-13 DIAGNOSIS — Z86711 Personal history of pulmonary embolism: Secondary | ICD-10-CM | POA: Diagnosis not present

## 2024-03-13 DIAGNOSIS — R600 Localized edema: Secondary | ICD-10-CM

## 2024-03-13 DIAGNOSIS — M792 Neuralgia and neuritis, unspecified: Secondary | ICD-10-CM | POA: Diagnosis not present

## 2024-03-13 DIAGNOSIS — R1905 Periumbilic swelling, mass or lump: Secondary | ICD-10-CM

## 2024-03-13 DIAGNOSIS — Z23 Encounter for immunization: Secondary | ICD-10-CM

## 2024-03-13 DIAGNOSIS — I10 Essential (primary) hypertension: Secondary | ICD-10-CM

## 2024-03-13 DIAGNOSIS — Z96651 Presence of right artificial knee joint: Secondary | ICD-10-CM | POA: Diagnosis not present

## 2024-03-13 DIAGNOSIS — R296 Repeated falls: Secondary | ICD-10-CM | POA: Diagnosis not present

## 2024-03-13 DIAGNOSIS — Z604 Social exclusion and rejection: Secondary | ICD-10-CM | POA: Diagnosis not present

## 2024-03-13 DIAGNOSIS — K429 Umbilical hernia without obstruction or gangrene: Secondary | ICD-10-CM | POA: Diagnosis not present

## 2024-03-13 DIAGNOSIS — Z7982 Long term (current) use of aspirin: Secondary | ICD-10-CM | POA: Diagnosis not present

## 2024-03-13 DIAGNOSIS — Z87891 Personal history of nicotine dependence: Secondary | ICD-10-CM | POA: Diagnosis not present

## 2024-03-13 LAB — COMPREHENSIVE METABOLIC PANEL WITH GFR
ALT: 16 U/L (ref 0–35)
AST: 17 U/L (ref 0–37)
Albumin: 3.9 g/dL (ref 3.5–5.2)
Alkaline Phosphatase: 144 U/L — ABNORMAL HIGH (ref 39–117)
BUN: 5 mg/dL — ABNORMAL LOW (ref 6–23)
CO2: 30 meq/L (ref 19–32)
Calcium: 9 mg/dL (ref 8.4–10.5)
Chloride: 103 meq/L (ref 96–112)
Creatinine, Ser: 0.43 mg/dL (ref 0.40–1.20)
GFR: 95.41 mL/min (ref >60.00)
Glucose, Bld: 124 mg/dL — ABNORMAL HIGH (ref 70–99)
Potassium: 4.5 meq/L (ref 3.5–5.1)
Sodium: 143 meq/L (ref 135–145)
Total Bilirubin: 1.4 mg/dL — ABNORMAL HIGH (ref 0.2–1.2)
Total Protein: 6.9 g/dL (ref 6.0–8.3)

## 2024-03-13 LAB — LIPID PANEL
Cholesterol: 131 mg/dL (ref 0–200)
HDL: 33 mg/dL — ABNORMAL LOW (ref >39.00)
LDL Cholesterol: 66 mg/dL (ref 0–99)
NonHDL: 98.04
Total CHOL/HDL Ratio: 4
Triglycerides: 162 mg/dL — ABNORMAL HIGH (ref 0.0–149.0)
VLDL: 32.4 mg/dL (ref 0.0–40.0)

## 2024-03-13 LAB — CBC WITH DIFFERENTIAL/PLATELET
Basophils Absolute: 0 K/uL (ref 0.0–0.1)
Basophils Relative: 0.4 % (ref 0.0–3.0)
Eosinophils Absolute: 0.1 K/uL (ref 0.0–0.7)
Eosinophils Relative: 1.3 % (ref 0.0–5.0)
HCT: 36 % (ref 36.0–46.0)
Hemoglobin: 11.7 g/dL — ABNORMAL LOW (ref 12.0–15.0)
Lymphocytes Relative: 20.9 % (ref 12.0–46.0)
Lymphs Abs: 1.8 K/uL (ref 0.7–4.0)
MCHC: 32.4 g/dL (ref 30.0–36.0)
MCV: 85 fl (ref 78.0–100.0)
Monocytes Absolute: 0.4 K/uL (ref 0.1–1.0)
Monocytes Relative: 5.2 % (ref 3.0–12.0)
Neutro Abs: 6.2 K/uL (ref 1.4–7.7)
Neutrophils Relative %: 72.2 % (ref 43.0–77.0)
Platelets: 225 K/uL (ref 150.0–400.0)
RBC: 4.23 Mil/uL (ref 3.87–5.11)
RDW: 16.7 % — ABNORMAL HIGH (ref 11.5–15.5)
WBC: 8.6 K/uL (ref 4.0–10.5)

## 2024-03-13 LAB — TSH: TSH: 2.98 u[IU]/mL (ref 0.35–5.50)
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
## 2024-03-17 ENCOUNTER — Ambulatory Visit: Payer: Self-pay | Admitting: Family Medicine

## 2024-03-17 ENCOUNTER — Other Ambulatory Visit: Payer: Self-pay | Admitting: Family Medicine

## 2024-03-17 ENCOUNTER — Ambulatory Visit (HOSPITAL_BASED_OUTPATIENT_CLINIC_OR_DEPARTMENT_OTHER)
Admission: RE | Admit: 2024-03-17 | Discharge: 2024-03-17 | Disposition: A | Discharge status: Home / Self Care | Source: Ambulatory Visit | Attending: Family Medicine | Admitting: Family Medicine

## 2024-03-17 DIAGNOSIS — R1905 Periumbilic swelling, mass or lump: Secondary | ICD-10-CM | POA: Insufficient documentation

## 2024-03-17 DIAGNOSIS — K429 Umbilical hernia without obstruction or gangrene: Secondary | ICD-10-CM | POA: Diagnosis not present

## 2024-03-17 DIAGNOSIS — R109 Unspecified abdominal pain: Secondary | ICD-10-CM | POA: Diagnosis not present

## 2024-03-17 DIAGNOSIS — Z9049 Acquired absence of other specified parts of digestive tract: Secondary | ICD-10-CM | POA: Diagnosis not present

## 2024-03-17 MED ORDER — IOHEXOL 300 MG/ML  SOLN
80.0000 mL | Freq: Once | INTRAMUSCULAR | Status: AC | PRN
  Administered 2024-03-17: 80 mL via INTRAVENOUS

## 2024-03-20 DIAGNOSIS — E785 Hyperlipidemia, unspecified: Secondary | ICD-10-CM | POA: Diagnosis not present

## 2024-03-20 DIAGNOSIS — Z7982 Long term (current) use of aspirin: Secondary | ICD-10-CM | POA: Diagnosis not present

## 2024-03-20 DIAGNOSIS — R296 Repeated falls: Secondary | ICD-10-CM | POA: Diagnosis not present

## 2024-03-20 DIAGNOSIS — K429 Umbilical hernia without obstruction or gangrene: Secondary | ICD-10-CM | POA: Diagnosis not present

## 2024-03-20 DIAGNOSIS — I10 Essential (primary) hypertension: Secondary | ICD-10-CM | POA: Diagnosis not present

## 2024-03-20 DIAGNOSIS — M5431 Sciatica, right side: Secondary | ICD-10-CM | POA: Diagnosis not present

## 2024-03-20 DIAGNOSIS — J45909 Unspecified asthma, uncomplicated: Secondary | ICD-10-CM | POA: Diagnosis not present

## 2024-03-20 DIAGNOSIS — Z604 Social exclusion and rejection: Secondary | ICD-10-CM | POA: Diagnosis not present

## 2024-03-20 DIAGNOSIS — Z86711 Personal history of pulmonary embolism: Secondary | ICD-10-CM | POA: Diagnosis not present

## 2024-03-20 DIAGNOSIS — M810 Age-related osteoporosis without current pathological fracture: Secondary | ICD-10-CM | POA: Diagnosis not present

## 2024-03-20 DIAGNOSIS — Z96651 Presence of right artificial knee joint: Secondary | ICD-10-CM | POA: Diagnosis not present

## 2024-03-20 DIAGNOSIS — Z86718 Personal history of other venous thrombosis and embolism: Secondary | ICD-10-CM | POA: Diagnosis not present

## 2024-03-20 DIAGNOSIS — Z87891 Personal history of nicotine dependence: Secondary | ICD-10-CM | POA: Diagnosis not present

## 2024-03-20 DIAGNOSIS — E039 Hypothyroidism, unspecified: Secondary | ICD-10-CM | POA: Diagnosis not present

## 2024-03-20 DIAGNOSIS — R739 Hyperglycemia, unspecified: Secondary | ICD-10-CM | POA: Diagnosis not present

## 2024-03-20 DIAGNOSIS — G8194 Hemiplegia, unspecified affecting left nondominant side: Secondary | ICD-10-CM | POA: Diagnosis not present

## 2024-03-20 DIAGNOSIS — Z853 Personal history of malignant neoplasm of breast: Secondary | ICD-10-CM | POA: Diagnosis not present

## 2024-03-20 DIAGNOSIS — M546 Pain in thoracic spine: Secondary | ICD-10-CM | POA: Diagnosis not present

## 2024-03-21 ENCOUNTER — Telehealth: Payer: Self-pay

## 2024-03-21 NOTE — Telephone Encounter (Signed)
 Spoke with patient. Pt was advised that the referral was placed due to pain. Pt verbalized understanding

## 2024-03-21 NOTE — Telephone Encounter (Signed)
 Copied from CRM [id number]. Topic: General - Other >> Mar 21, 2024  7:34 AM Aleatha C wrote: Reason for CRM: Patient wanted to verify why she needs to call a surgeon if her results were negative for cancer, she said her daughter got a call saying she needed to call a surgeon, patient would like a call back to go over more of this  call back #[phone]

## 2024-03-30 DIAGNOSIS — R739 Hyperglycemia, unspecified: Secondary | ICD-10-CM | POA: Diagnosis not present

## 2024-03-30 DIAGNOSIS — Z7982 Long term (current) use of aspirin: Secondary | ICD-10-CM | POA: Diagnosis not present

## 2024-03-30 DIAGNOSIS — Z96651 Presence of right artificial knee joint: Secondary | ICD-10-CM | POA: Diagnosis not present

## 2024-03-30 DIAGNOSIS — E039 Hypothyroidism, unspecified: Secondary | ICD-10-CM | POA: Diagnosis not present

## 2024-03-30 DIAGNOSIS — M5431 Sciatica, right side: Secondary | ICD-10-CM | POA: Diagnosis not present

## 2024-03-30 DIAGNOSIS — Z853 Personal history of malignant neoplasm of breast: Secondary | ICD-10-CM | POA: Diagnosis not present

## 2024-03-30 DIAGNOSIS — M810 Age-related osteoporosis without current pathological fracture: Secondary | ICD-10-CM | POA: Diagnosis not present

## 2024-03-30 DIAGNOSIS — R296 Repeated falls: Secondary | ICD-10-CM | POA: Diagnosis not present

## 2024-03-30 DIAGNOSIS — J45909 Unspecified asthma, uncomplicated: Secondary | ICD-10-CM | POA: Diagnosis not present

## 2024-03-30 DIAGNOSIS — E785 Hyperlipidemia, unspecified: Secondary | ICD-10-CM | POA: Diagnosis not present

## 2024-03-30 DIAGNOSIS — I10 Essential (primary) hypertension: Secondary | ICD-10-CM | POA: Diagnosis not present

## 2024-03-30 DIAGNOSIS — K429 Umbilical hernia without obstruction or gangrene: Secondary | ICD-10-CM | POA: Diagnosis not present

## 2024-03-30 DIAGNOSIS — Z86711 Personal history of pulmonary embolism: Secondary | ICD-10-CM | POA: Diagnosis not present

## 2024-03-30 DIAGNOSIS — M546 Pain in thoracic spine: Secondary | ICD-10-CM | POA: Diagnosis not present

## 2024-03-30 DIAGNOSIS — G8194 Hemiplegia, unspecified affecting left nondominant side: Secondary | ICD-10-CM | POA: Diagnosis not present

## 2024-03-30 DIAGNOSIS — Z604 Social exclusion and rejection: Secondary | ICD-10-CM | POA: Diagnosis not present

## 2024-03-30 DIAGNOSIS — Z86718 Personal history of other venous thrombosis and embolism: Secondary | ICD-10-CM | POA: Diagnosis not present

## 2024-03-30 DIAGNOSIS — Z87891 Personal history of nicotine dependence: Secondary | ICD-10-CM | POA: Diagnosis not present

## 2024-04-06 ENCOUNTER — Ambulatory Visit: Payer: Self-pay | Admitting: Surgery

## 2024-04-10 ENCOUNTER — Other Ambulatory Visit: Payer: Self-pay | Admitting: Family Medicine

## 2024-04-10 DIAGNOSIS — E039 Hypothyroidism, unspecified: Secondary | ICD-10-CM

## 2024-04-11 NOTE — Patient Instructions (Signed)
 SURGICAL WAITING ROOM VISITATION Patients having surgery or a procedure may have no more than 2 support people in the waiting area - these visitors may rotate.    Children under the age of 58 must have an adult with them who is not the patient.  If the patient needs to stay at the hospital during part of their recovery, the visitor guidelines for inpatient rooms apply. Pre-op nurse will coordinate an appropriate time for 1 support person to accompany patient in pre-op.  This support person may not rotate.    Please refer to the [facility] website for the visitor guidelines for Inpatients (after your surgery is over and you are in a regular room).       Your procedure is scheduled on: 04-17-24   Report to [facility] Main Entrance    Report to admitting at 1:15 PM   Call this number if you have problems the morning of surgery [phone]   Do not eat food :After Midnight.   After Midnight you may have the following liquids until 12:30 PM DAY OF SURGERY  Water Non-Citrus Juices (without pulp, NO RED-Apple, White grape, White cranberry) Black Coffee (NO MILK/CREAM OR CREAMERS, sugar ok)  Clear Tea (NO MILK/CREAM OR CREAMERS, sugar ok) regular and decaf                             Plain Jell-O (NO RED)                                           Fruit ices (not with fruit pulp, NO RED)                                     Popsicles (NO RED)                                                               Sports drinks like Gatorade (NO RED)                       If you have questions, please contact your surgeon's office.   FOLLOW  ANY ADDITIONAL PRE OP INSTRUCTIONS YOU RECEIVED FROM YOUR SURGEON'S OFFICE!!!     Oral Hygiene is also important to reduce your risk of infection.                                    Remember - BRUSH YOUR TEETH THE MORNING OF SURGERY WITH YOUR REGULAR TOOTHPASTE   Do NOT smoke after Midnight   Take these medicines the morning of surgery with A  SIP OF WATER:    Atorvastatin    Famotidine    Gabapentin    Synthroid    Tramadol  if needed  Stop all vitamins and herbal supplements 7 days before surgery  Bring CPAP mask and tubing day of surgery.  You may not have any metal on your body including hair pins, jewelry, and body piercing             Do not wear make-up, lotions, powders, perfumes or deodorant  Do not wear nail polish including gel and S&S, artificial/acrylic nails, or any other type of covering on natural nails including finger and toenails. If you have artificial nails, gel coating, etc. that needs to be removed by a nail salon please have this removed prior to surgery or surgery may need to be canceled/ delayed if the surgeon/ anesthesia feels like they are unable to be safely monitored.   Do not shave  48 hours prior to surgery.    Do not bring valuables to the hospital. [facility] IS NOT RESPONSIBLE   FOR VALUABLES.   Contacts, dentures or bridgework may not be worn into surgery.   Bring small overnight bag day of surgery.   DO NOT BRING YOUR HOME MEDICATIONS TO THE HOSPITAL. PHARMACY WILL DISPENSE MEDICATIONS LISTED ON YOUR MEDICATION LIST TO YOU DURING YOUR ADMISSION IN THE HOSPITAL!    Patients discharged on the day of surgery will not be allowed to drive home.  Someone NEEDS to stay with you for the first 24 hours after anesthesia.   Special Instructions: Bring a copy of your healthcare power of attorney and living will documents the day of surgery if you haven't scanned them before.              Please read over the following fact sheets you were given: IF YOU HAVE QUESTIONS ABOUT YOUR PRE-OP INSTRUCTIONS PLEASE CALL [phone] Gwen  If you received a COVID test during your pre-op visit  it is requested that you wear a mask when out in public, stay away from anyone that may not be feeling well and notify your surgeon if you develop symptoms. If you test positive for Covid or have  been in contact with anyone that has tested positive in the last 10 days please notify you surgeon.  [place] - Preparing for Surgery Before surgery, you can play an important role.  Because skin is not sterile, your skin needs to be as free of germs as possible.  You can reduce the number of germs on your skin by washing with CHG (chlorahexidine gluconate) soap before surgery.  CHG is an antiseptic cleaner which kills germs and bonds with the skin to continue killing germs even after washing. Please DO NOT use if you have an allergy to CHG or antibacterial soaps.  If your skin becomes reddened/irritated stop using the CHG and inform your nurse when you arrive at Short Stay. Do not shave (including legs and underarms) for at least 48 hours prior to the first CHG shower.  You may shave your face/neck.  Please follow these instructions carefully:  1.  Shower with CHG Soap the night before surgery ONLY (DO NOT USE THE SOAP THE MORNING OF SURGERY).  2.  If you choose to wash your hair, wash your hair first as usual with your normal  shampoo.  3.  After you shampoo, rinse your hair and body thoroughly to remove the shampoo.                             4.  Use CHG as you would any other liquid soap.  You can apply chg directly to the skin and wash.  Gently with a scrungie or clean  washcloth.  5.  Apply the CHG Soap to your body ONLY FROM THE NECK DOWN.   Do   not use on face/ open                           Wound or open sores. Avoid contact with eyes, ears mouth and   genitals (private parts).                       Wash face,  Genitals (private parts) with your normal soap.             6.  Wash thoroughly, paying special attention to the area where your    surgery  will be performed.  7.  Thoroughly rinse your body with warm water from the neck down.  8.  DO NOT shower/wash with your normal soap after using and rinsing off the CHG Soap.                9.  Pat yourself dry with a clean towel.             10.  Wear clean pajamas.            11.  Place clean sheets on your bed the night of your first shower and do not  sleep with pets. Day of Surgery : Do not apply any CHG, lotions/deodorants the morning of surgery.  Please wear clean clothes to the hospital/surgery center.  FAILURE TO FOLLOW THESE INSTRUCTIONS MAY RESULT IN THE CANCELLATION OF YOUR SURGERY  PATIENT SIGNATURE_________________________________  NURSE SIGNATURE__________________________________  ________________________________________________________________________

## 2024-04-13 NOTE — Progress Notes (Signed)
  Date of COVID positive in last 90 days:  PCP - Jamee Antonio Meth, DO Cardiologist -   Chest x-ray - N/A EKG - Stress Test - 07-18-15 Epic ECHO - 07-18-15 Epic Cardiac Cath - N/A Pacemaker/ICD device last checked:N/A Spinal Cord Stimulator:N/A  Bowel Prep - N/A  Sleep Study - N/A CPAP -   Fasting Blood Sugar - N/A Checks Blood Sugar _____ times a day  Last dose of GLP1 agonist-  N/A GLP1 instructions:  Do not take after     Last dose of SGLT-2 inhibitors-  N/A SGLT-2 instructions:  Do not take after     Blood Thinner Instructions: N/A Last dose:   Time: Aspirin  Instructions:  ASA 81 Last Dose:  Activity level:  Can go up a flight of stairs and perform activities of daily living without stopping and without symptoms of chest pain or shortness of breath.  Able to exercise without symptoms  Unable to go up a flight of stairs without symptoms of     Anesthesia review: N/A  Patient denies shortness of breath, fever, cough and chest pain at PAT appointment  Patient verbalized understanding of instructions that were given to them at the PAT appointment. Patient was also instructed that they will need to review over the PAT instructions again at home before surgery.

## 2024-04-14 ENCOUNTER — Encounter (HOSPITAL_COMMUNITY)
Admission: RE | Admit: 2024-04-14 | Discharge: 2024-04-14 | Disposition: A | Discharge status: Home / Self Care | Source: Ambulatory Visit | Attending: Surgery | Admitting: Surgery

## 2024-04-14 ENCOUNTER — Other Ambulatory Visit: Payer: Self-pay

## 2024-04-14 ENCOUNTER — Encounter (HOSPITAL_COMMUNITY): Payer: Self-pay

## 2024-04-14 VITALS — BP 123/72 | HR 78 | Temp 97.9°F | Resp 16 | Ht 60.0 in | Wt 225.0 lb

## 2024-04-14 DIAGNOSIS — Z01812 Encounter for preprocedural laboratory examination: Secondary | ICD-10-CM | POA: Diagnosis present

## 2024-04-14 DIAGNOSIS — I10 Essential (primary) hypertension: Secondary | ICD-10-CM | POA: Insufficient documentation

## 2024-04-14 DIAGNOSIS — R9431 Abnormal electrocardiogram [ECG] [EKG]: Secondary | ICD-10-CM | POA: Diagnosis not present

## 2024-04-14 DIAGNOSIS — Z01810 Encounter for preprocedural cardiovascular examination: Secondary | ICD-10-CM | POA: Diagnosis present

## 2024-04-14 DIAGNOSIS — Z01818 Encounter for other preprocedural examination: Secondary | ICD-10-CM | POA: Insufficient documentation

## 2024-04-14 LAB — CBC
HCT: 38.9 % (ref 36.0–46.0)
Hemoglobin: 12.1 g/dL (ref 12.0–15.0)
MCH: 27.9 pg (ref 26.0–34.0)
MCHC: 31.1 g/dL (ref 30.0–36.0)
MCV: 89.6 fL (ref 80.0–100.0)
Platelets: 357 K/uL (ref 150–400)
RBC: 4.34 MIL/uL (ref 3.87–5.11)
RDW: 15.1 % (ref 11.5–15.5)
WBC: 7.8 K/uL (ref 4.0–10.5)
nRBC: 0 % (ref 0.0–0.2)

## 2024-04-14 LAB — BASIC METABOLIC PANEL WITH GFR
Anion gap: 12 (ref 5–15)
BUN: 9 mg/dL (ref 8–23)
CO2: 26 mmol/L (ref 22–32)
Calcium: 9.2 mg/dL (ref 8.9–10.3)
Chloride: 105 mmol/L (ref 98–111)
Creatinine, Ser: 0.53 mg/dL (ref 0.44–1.00)
GFR, Estimated: >60 mL/min (ref >60)
Glucose, Bld: 131 mg/dL — ABNORMAL HIGH (ref 70–99)
Potassium: 3.9 mmol/L (ref 3.5–5.1)
Sodium: 143 mmol/L (ref 135–145)

## 2024-04-17 ENCOUNTER — Encounter (HOSPITAL_COMMUNITY): Payer: Self-pay | Admitting: Surgery

## 2024-04-17 ENCOUNTER — Ambulatory Visit (HOSPITAL_COMMUNITY): Admitting: Registered Nurse

## 2024-04-17 ENCOUNTER — Ambulatory Visit (HOSPITAL_COMMUNITY): Payer: Self-pay | Admitting: Physician Assistant

## 2024-04-17 ENCOUNTER — Other Ambulatory Visit: Payer: Self-pay

## 2024-04-17 ENCOUNTER — Observation Stay (HOSPITAL_COMMUNITY)
Admission: RE | Admit: 2024-04-17 | Discharge: 2024-04-19 | Disposition: A | Discharge status: Home / Self Care | Attending: Surgery | Admitting: Surgery

## 2024-04-17 ENCOUNTER — Encounter: Admitting: Family Medicine

## 2024-04-17 ENCOUNTER — Encounter (HOSPITAL_COMMUNITY)
Admission: RE | Disposition: A | Discharge status: Home / Self Care | Payer: Self-pay | Source: Home / Self Care | Attending: Surgery

## 2024-04-17 DIAGNOSIS — R2689 Other abnormalities of gait and mobility: Secondary | ICD-10-CM | POA: Diagnosis not present

## 2024-04-17 DIAGNOSIS — E039 Hypothyroidism, unspecified: Secondary | ICD-10-CM | POA: Diagnosis not present

## 2024-04-17 DIAGNOSIS — Z87891 Personal history of nicotine dependence: Secondary | ICD-10-CM | POA: Insufficient documentation

## 2024-04-17 DIAGNOSIS — K429 Umbilical hernia without obstruction or gangrene: Principal | ICD-10-CM | POA: Diagnosis present

## 2024-04-17 DIAGNOSIS — Z7982 Long term (current) use of aspirin: Secondary | ICD-10-CM | POA: Diagnosis not present

## 2024-04-17 DIAGNOSIS — Z853 Personal history of malignant neoplasm of breast: Secondary | ICD-10-CM | POA: Insufficient documentation

## 2024-04-17 DIAGNOSIS — I10 Essential (primary) hypertension: Secondary | ICD-10-CM | POA: Diagnosis not present

## 2024-04-17 DIAGNOSIS — Z79899 Other long term (current) drug therapy: Secondary | ICD-10-CM | POA: Diagnosis not present

## 2024-04-17 DIAGNOSIS — M7752 Other enthesopathy of left foot: Secondary | ICD-10-CM

## 2024-04-17 DIAGNOSIS — R1032 Left lower quadrant pain: Principal | ICD-10-CM

## 2024-04-17 HISTORY — PX: UMBILICAL HERNIA REPAIR: SHX196

## 2024-04-17 LAB — CBC
HCT: 37 % (ref 36.0–46.0)
Hemoglobin: 11.3 g/dL — ABNORMAL LOW (ref 12.0–15.0)
MCH: 27.6 pg (ref 26.0–34.0)
MCHC: 30.5 g/dL (ref 30.0–36.0)
MCV: 90.2 fL (ref 80.0–100.0)
Platelets: 168 K/uL (ref 150–400)
RBC: 4.1 MIL/uL (ref 3.87–5.11)
RDW: 14.9 % (ref 11.5–15.5)
WBC: 7 K/uL (ref 4.0–10.5)
nRBC: 0 % (ref 0.0–0.2)

## 2024-04-17 LAB — CREATININE, SERUM
Creatinine, Ser: 0.53 mg/dL (ref 0.44–1.00)
GFR, Estimated: >60 mL/min (ref >60)

## 2024-04-17 SURGERY — REPAIR, HERNIA, UMBILICAL, ADULT
Anesthesia: General

## 2024-04-17 MED ORDER — SUGAMMADEX SODIUM 200 MG/2ML IV SOLN
INTRAVENOUS | Status: DC | PRN
  Administered 2024-04-17: 200 mg via INTRAVENOUS

## 2024-04-17 MED ORDER — BUPIVACAINE-EPINEPHRINE (PF) 0.25% -1:200000 IJ SOLN
INTRAMUSCULAR | Status: AC
Start: 2024-04-17 — End: 2024-04-17
  Filled 2024-04-17: qty 30

## 2024-04-17 MED ORDER — SUGAMMADEX SODIUM 200 MG/2ML IV SOLN
INTRAVENOUS | Status: AC
  Filled 2024-04-17: qty 2

## 2024-04-17 MED ORDER — DOCUSATE SODIUM 100 MG PO CAPS
100.0000 mg | ORAL_CAPSULE | Freq: Two times a day (BID) | ORAL | Status: DC
  Administered 2024-04-17 – 2024-04-19 (×4): 100 mg via ORAL
  Filled 2024-04-17 (×4): qty 1

## 2024-04-17 MED ORDER — LACTATED RINGERS IV SOLN: INTRAVENOUS | Status: DC

## 2024-04-17 MED ORDER — LEVOTHYROXINE SODIUM 75 MCG PO TABS
75.0000 ug | ORAL_TABLET | Freq: Every day | ORAL | Status: DC
  Administered 2024-04-18 – 2024-04-19 (×2): 75 ug via ORAL
  Filled 2024-04-17 (×2): qty 1

## 2024-04-17 MED ORDER — FENTANYL CITRATE (PF) 100 MCG/2ML IJ SOLN
INTRAMUSCULAR | Status: AC
  Filled 2024-04-17: qty 2

## 2024-04-17 MED ORDER — LIDOCAINE HCL (PF) 2 % IJ SOLN
INTRAMUSCULAR | Status: AC
  Filled 2024-04-17: qty 5

## 2024-04-17 MED ORDER — HEPARIN SODIUM (PORCINE) 5000 UNIT/ML IJ SOLN
5000.0000 [IU] | Freq: Once | INTRAMUSCULAR | Status: AC
  Administered 2024-04-17: 5000 [IU] via SUBCUTANEOUS
  Filled 2024-04-17: qty 1

## 2024-04-17 MED ORDER — DROPERIDOL 2.5 MG/ML IJ SOLN: 0.6250 mg | Freq: Once | INTRAMUSCULAR | Status: DC | PRN

## 2024-04-17 MED ORDER — CEFAZOLIN SODIUM-DEXTROSE 2-4 GM/100ML-% IV SOLN
2.0000 g | INTRAVENOUS | Status: AC
  Administered 2024-04-17: 2 g via INTRAVENOUS
  Filled 2024-04-17: qty 100

## 2024-04-17 MED ORDER — ACETAMINOPHEN 500 MG PO TABS
1000.0000 mg | ORAL_TABLET | ORAL | Status: AC
  Administered 2024-04-17: 1000 mg via ORAL
  Filled 2024-04-17: qty 2

## 2024-04-17 MED ORDER — BUPIVACAINE-EPINEPHRINE 0.25% -1:200000 IJ SOLN
INTRAMUSCULAR | Status: DC | PRN
  Administered 2024-04-17: 20 mL

## 2024-04-17 MED ORDER — PHENYLEPHRINE HCL (PRESSORS) 10 MG/ML IV SOLN
INTRAVENOUS | Status: AC
  Filled 2024-04-17: qty 1

## 2024-04-17 MED ORDER — PROCHLORPERAZINE EDISYLATE 10 MG/2ML IJ SOLN: 10.0000 mg | INTRAMUSCULAR | Status: DC | PRN

## 2024-04-17 MED ORDER — CHLORHEXIDINE GLUCONATE CLOTH 2 % EX PADS: 6.0000 | MEDICATED_PAD | Freq: Once | CUTANEOUS | Status: DC

## 2024-04-17 MED ORDER — 0.9 % SODIUM CHLORIDE (POUR BTL) OPTIME
TOPICAL | Status: DC | PRN
  Administered 2024-04-17: 1000 mL

## 2024-04-17 MED ORDER — LIDOCAINE HCL (CARDIAC) PF 100 MG/5ML IV SOSY
PREFILLED_SYRINGE | INTRAVENOUS | Status: DC | PRN
  Administered 2024-04-17: 100 mg via INTRAVENOUS

## 2024-04-17 MED ORDER — PROPOFOL 1000 MG/100ML IV EMUL
INTRAVENOUS | Status: AC
  Filled 2024-04-17: qty 100

## 2024-04-17 MED ORDER — DEXAMETHASONE SODIUM PHOSPHATE 4 MG/ML IJ SOLN
INTRAMUSCULAR | Status: DC | PRN
  Administered 2024-04-17: 4 mg via INTRAVENOUS

## 2024-04-17 MED ORDER — BENAZEPRIL HCL 5 MG PO TABS
10.0000 mg | ORAL_TABLET | Freq: Every day | ORAL | Status: DC
  Administered 2024-04-17 – 2024-04-19 (×3): 10 mg via ORAL
  Filled 2024-04-17 (×3): qty 2

## 2024-04-17 MED ORDER — FAMOTIDINE 20 MG PO TABS
20.0000 mg | ORAL_TABLET | Freq: Two times a day (BID) | ORAL | Status: DC
  Administered 2024-04-17 – 2024-04-19 (×4): 20 mg via ORAL
  Filled 2024-04-17 (×4): qty 1

## 2024-04-17 MED ORDER — GABAPENTIN 100 MG PO CAPS
100.0000 mg | ORAL_CAPSULE | Freq: Two times a day (BID) | ORAL | Status: DC
  Administered 2024-04-17 – 2024-04-19 (×4): 100 mg via ORAL
  Filled 2024-04-17 (×4): qty 1

## 2024-04-17 MED ORDER — ROCURONIUM BROMIDE 100 MG/10ML IV SOLN
INTRAVENOUS | Status: DC | PRN
  Administered 2024-04-17: 60 mg via INTRAVENOUS

## 2024-04-17 MED ORDER — ONDANSETRON HCL 4 MG/2ML IJ SOLN: 4.0000 mg | Freq: Four times a day (QID) | INTRAMUSCULAR | Status: DC | PRN

## 2024-04-17 MED ORDER — CHLORHEXIDINE GLUCONATE 0.12 % MT SOLN
15.0000 mL | Freq: Once | OROMUCOSAL | Status: AC
  Administered 2024-04-17: 15 mL via OROMUCOSAL

## 2024-04-17 MED ORDER — PROPOFOL 10 MG/ML IV BOLUS
INTRAVENOUS | Status: DC | PRN
  Administered 2024-04-17: 140 mg via INTRAVENOUS

## 2024-04-17 MED ORDER — ATORVASTATIN CALCIUM 20 MG PO TABS
20.0000 mg | ORAL_TABLET | Freq: Every day | ORAL | Status: DC
  Administered 2024-04-17 – 2024-04-19 (×3): 20 mg via ORAL
  Filled 2024-04-17 (×3): qty 1

## 2024-04-17 MED ORDER — ROCURONIUM BROMIDE 10 MG/ML (PF) SYRINGE
PREFILLED_SYRINGE | INTRAVENOUS | Status: AC
  Filled 2024-04-17: qty 10

## 2024-04-17 MED ORDER — OXYMETAZOLINE HCL 0.05 % NA SOLN
NASAL | Status: DC | PRN
  Administered 2024-04-17: 2 via NASAL

## 2024-04-17 MED ORDER — HYDROMORPHONE HCL 1 MG/ML IJ SOLN
0.5000 mg | INTRAMUSCULAR | Status: DC | PRN
Start: 2024-04-17 — End: 2024-04-19

## 2024-04-17 MED ORDER — METHOCARBAMOL 1000 MG/10ML IJ SOLN: 500.0000 mg | Freq: Four times a day (QID) | INTRAMUSCULAR | Status: DC | PRN

## 2024-04-17 MED ORDER — MIDAZOLAM HCL 5 MG/5ML IJ SOLN
INTRAMUSCULAR | Status: DC | PRN
  Administered 2024-04-17: 1 mg via INTRAVENOUS

## 2024-04-17 MED ORDER — OXYMETAZOLINE HCL 0.05 % NA SOLN
NASAL | Status: AC
  Filled 2024-04-17: qty 30

## 2024-04-17 MED ORDER — BETAMETHASONE SOD PHOS & ACET 6 (3-3) MG/ML IJ SUSP: 3.0000 mg | Freq: Once | INTRAMUSCULAR | Status: DC

## 2024-04-17 MED ORDER — FENTANYL CITRATE (PF) 100 MCG/2ML IJ SOLN
INTRAMUSCULAR | Status: DC | PRN
  Administered 2024-04-17 (×2): 50 ug via INTRAVENOUS

## 2024-04-17 MED ORDER — TRAMADOL HCL 50 MG PO TABS
50.0000 mg | ORAL_TABLET | Freq: Four times a day (QID) | ORAL | Status: DC | PRN
  Administered 2024-04-17 – 2024-04-18 (×2): 100 mg via ORAL
  Filled 2024-04-17 (×2): qty 2

## 2024-04-17 MED ORDER — ENOXAPARIN SODIUM 40 MG/0.4ML IJ SOSY
40.0000 mg | PREFILLED_SYRINGE | INTRAMUSCULAR | Status: DC
  Administered 2024-04-18 – 2024-04-19 (×2): 40 mg via SUBCUTANEOUS
  Filled 2024-04-17 (×2): qty 0.4

## 2024-04-17 MED ORDER — ORAL CARE MOUTH RINSE: 15.0000 mL | Freq: Once | OROMUCOSAL | Status: AC

## 2024-04-17 MED ORDER — FENTANYL CITRATE (PF) 50 MCG/ML IJ SOSY
25.0000 ug | PREFILLED_SYRINGE | INTRAMUSCULAR | Status: DC | PRN
  Administered 2024-04-17 (×2): 25 ug via INTRAVENOUS

## 2024-04-17 MED ORDER — ACETAMINOPHEN 325 MG PO TABS
650.0000 mg | ORAL_TABLET | Freq: Four times a day (QID) | ORAL | Status: DC
  Administered 2024-04-17 – 2024-04-19 (×8): 650 mg via ORAL
  Filled 2024-04-17 (×8): qty 2

## 2024-04-17 MED ORDER — ASPIRIN 81 MG PO TBEC
81.0000 mg | DELAYED_RELEASE_TABLET | Freq: Every day | ORAL | Status: DC
  Administered 2024-04-17 – 2024-04-19 (×3): 81 mg via ORAL
  Filled 2024-04-17 (×3): qty 1

## 2024-04-17 MED ORDER — TRAMADOL HCL 50 MG PO TABS: 50.0000 mg | ORAL_TABLET | Freq: Four times a day (QID) | ORAL | 0 refills | Status: DC | PRN

## 2024-04-17 MED ORDER — PROPOFOL 500 MG/50ML IV EMUL
INTRAVENOUS | Status: DC | PRN
  Administered 2024-04-17: 50 ug/kg/min via INTRAVENOUS

## 2024-04-17 MED ORDER — ONDANSETRON HCL 4 MG/2ML IJ SOLN
INTRAMUSCULAR | Status: AC
  Filled 2024-04-17: qty 2

## 2024-04-17 MED ORDER — MIDAZOLAM HCL 2 MG/2ML IJ SOLN
INTRAMUSCULAR | Status: AC
Start: 2024-04-17 — End: 2024-04-17
  Filled 2024-04-17: qty 2

## 2024-04-17 MED ORDER — FENTANYL CITRATE (PF) 50 MCG/ML IJ SOSY
PREFILLED_SYRINGE | INTRAMUSCULAR | Status: AC
  Filled 2024-04-17: qty 1

## 2024-04-17 MED ORDER — ONDANSETRON HCL 4 MG/2ML IJ SOLN
INTRAMUSCULAR | Status: DC | PRN
  Administered 2024-04-17: 4 mg via INTRAVENOUS

## 2024-04-17 SURGICAL SUPPLY — 22 items
COTTONBALL LRG STERILE PKG (GAUZE/BANDAGES/DRESSINGS) ×1 IMPLANT
COVER SURGICAL LIGHT HANDLE (MISCELLANEOUS) ×1 IMPLANT
DERMABOND ADVANCED .7 DNX12 (GAUZE/BANDAGES/DRESSINGS) ×1 IMPLANT
DRAPE LAPAROTOMY T 98X78 PEDS (DRAPES) ×1 IMPLANT
DRSG TEGADERM 4X4.75 (GAUZE/BANDAGES/DRESSINGS) ×1 IMPLANT
ELECT REM PT RETURN 15FT ADLT (MISCELLANEOUS) ×1 IMPLANT
GLOVE BIO SURGEON STRL SZ7.5 (GLOVE) ×1 IMPLANT
GLOVE INDICATOR 8.0 STRL GRN (GLOVE) ×1 IMPLANT
GOWN STRL REUS W/ TWL XL LVL3 (GOWN DISPOSABLE) ×1 IMPLANT
KIT BASIN OR (CUSTOM PROCEDURE TRAY) ×1 IMPLANT
KIT TURNOVER KIT A (KITS) ×1 IMPLANT
NDL HYPO 20X1 EYE RM 11 (NEEDLE) ×1 IMPLANT
NEEDLE HYPO 20X1 EYE RM 11 (NEEDLE) ×1 IMPLANT
NS IRRIG 1000ML POUR BTL (IV SOLUTION) ×1 IMPLANT
PACK GENERAL/GYN (CUSTOM PROCEDURE TRAY) ×1 IMPLANT
SPIKE FLUID TRANSFER (MISCELLANEOUS) ×1 IMPLANT
SUT MNCRL AB 4-0 PS2 18 (SUTURE) ×1 IMPLANT
SUT NOVA NAB GS-21 0 18 T12 DT (SUTURE) IMPLANT
SUT PDS AB 2-0 CT2 27 (SUTURE) IMPLANT
SUT VIC AB 3-0 SH 8-18 (SUTURE) ×1 IMPLANT
SYR CONTROL 10ML LL (SYRINGE) ×1 IMPLANT
TOWEL OR 17X26 10 PK STRL BLUE (TOWEL DISPOSABLE) ×1 IMPLANT

## 2024-04-17 NOTE — Anesthesia Postprocedure Evaluation (Signed)
 Anesthesia Post Note  Patient: Kelly [name]  Procedure(s) Performed: REPAIR, HERNIA, UMBILICAL, ADULT     Patient location during evaluation: PACU Anesthesia Type: General Level of consciousness: awake and alert Pain management: pain level controlled Vital Signs Assessment: post-procedure vital signs reviewed and stable Respiratory status: spontaneous breathing, nonlabored ventilation and respiratory function stable Cardiovascular status: blood pressure returned to baseline Postop Assessment: no apparent nausea or vomiting Anesthetic complications: no   No notable events documented.  Last Vitals:  Vitals:   04/17/24 1700 04/17/24 1715  BP: 124/60 132/70  Pulse: 76 72  Resp: 13 18  Temp:    SpO2: 99% 100%    Last Pain:  Vitals:   04/17/24 1715  TempSrc:   PainSc: Asleep                 Vertell Row

## 2024-04-17 NOTE — Anesthesia Preprocedure Evaluation (Addendum)
 Anesthesia Evaluation  Patient identified by MRN, date of birth, ID band Patient awake    Reviewed: Allergy & Precautions, NPO status , Patient's Chart, lab work & pertinent test results  History of Anesthesia Complications Negative for: history of anesthetic complications  Airway Mallampati: II  TM Distance: >3 FB Neck ROM: Full    Dental  (+) Edentulous Lower, Edentulous Upper   Pulmonary former smoker, PE (2001)   Pulmonary exam normal        Cardiovascular hypertension, Pt. on medications Normal cardiovascular exam     Neuro/Psych Left hemiparesis    GI/Hepatic Neg liver ROS, hiatal hernia,,,Umbilical hernia   Endo/Other  Hypothyroidism  Class 3 obesity  Renal/GU negative Renal ROS     Musculoskeletal  (+) Arthritis ,    Abdominal   Peds  Hematology negative hematology ROS (+)   Anesthesia Other Findings   Reproductive/Obstetrics                              Anesthesia Physical Anesthesia Plan  ASA: 3  Anesthesia Plan: General   Post-op Pain Management: Tylenol  PO (pre-op)* and Dilaudid  IV   Induction: Intravenous  PONV Risk Score and Plan: 3 and Treatment may vary due to age or medical condition, Ondansetron , Dexamethasone , Midazolam  and Propofol  infusion  Airway Management Planned: Oral ETT  Additional Equipment: None  Intra-op Plan:   Post-operative Plan: Extubation in OR  Informed Consent: I have reviewed the patients History and Physical, chart, labs and discussed the procedure including the risks, benefits and alternatives for the proposed anesthesia with the patient or authorized representative who has indicated his/her understanding and acceptance.     Dental advisory given  Plan Discussed with: CRNA  Anesthesia Plan Comments:          Anesthesia Quick Evaluation

## 2024-04-17 NOTE — Transfer of Care (Signed)
 Immediate Anesthesia Transfer of Care Note  Patient: Kelly [name]  Procedure(s) Performed: REPAIR, HERNIA, UMBILICAL, ADULT  Patient Location: PACU  Anesthesia Type:General  Level of Consciousness: drowsy  Airway & Oxygen  Therapy: Patient Spontanous Breathing and Patient connected to face mask oxygen   Post-op Assessment: Report given to RN and Post -op Vital signs reviewed and stable  Post vital signs: Reviewed and stable  Last Vitals:  Vitals Value Taken Time  BP 138/75 04/17/24 16:32  Temp    Pulse 89 04/17/24 16:35  Resp 11 04/17/24 16:35  SpO2 100 % 04/17/24 16:35  Vitals shown include unfiled device data.  Last Pain:  Vitals:   04/17/24 1332  TempSrc:   PainSc: 6       Patients Stated Pain Goal: 5 (04/17/24 1325)  Complications: No notable events documented.

## 2024-04-17 NOTE — Op Note (Signed)
   Patient: Kelly [name] (21-May-1951, [id number])  Date of Surgery: 04/17/2024  Preoperative Diagnosis: UMBILICAL HERNIA   Postoperative Diagnosis: UMBILICAL HERNIA   Surgical Procedure: REPAIR, HERNIA, UMBILICAL, ADULT: 49591 (CPT)    Operative Team Members:  Surgeons and Role:    * Neno Hohensee, Deward PARAS, MD - Primary   Anesthesiologist: Mylene Bow Lamarr BRAVO, MD CRNA: Lanning Cena RAMAN, CRNA   Anesthesia: General   Fluids:  No intake/output data recorded.  Complications: * No complications entered in OR log *  Drains:  none   Specimen: * No specimens in log *   Disposition:  PACU - hemodynamically stable.  Plan of Care: Discharge to home after PACU    Indications for Procedure: Kelly [name] is a 73 y.o. female who presented with umbilical hernia.  I recommended open repair.  We discussed the procedure, its risks, benefits and alternatives and the patient granted consent to proceed.  Findings:  Hernia Location: umbilical  Hernia Size:  1 x 1 cm  Mesh Size &Type:  No mesh  Description of Procedure:  The patient was positioned supine, padded and secured to the bed.  The abdomen was widely prepped and draped.  A time out procedure was performed.    A curvilinear incision was made above the umbilicus and dissection was carried down through the subcutaneous tissue to the level of the fascia.  The umbilical stalk was encircled and the hernia sac amputated off the umbilical skin.  The hernia defect was measured as 1 cm wide by 1 cm in vertical dimension.    A sutured umbilical hernia repair was performed utilizing figure of eight 0 Novafil suture. The defect was closed horizontally under no tension.  The wound was irrigated with saline.  The umbilical skin was tacked down to the fascial repair with 4-0 Monocryl suture.  The skin was closed with 4-0 Monocryl subcuticular suture and skin glue.    Deward Foy, MD General, Bariatric, & Minimally Invasive  Surgery [facility], [place]

## 2024-04-17 NOTE — Discharge Instructions (Signed)
 VENTRAL HERNIA REPAIR POST OPERATIVE INSTRUCTIONS  Thinking Clearly  The anesthesia may cause you to feel different for 1 or 2 days. Do not drive, drink alcohol, or make any big decisions for at least 2 days.  Nutrition When you wake up, you will be able to drink small amounts of liquid. If you do not feel sick, you can slowly advance your diet to regular foods. Continue to drink lots of fluids, usually about 8 to 10 glasses per day. Eat a high-fiber diet so you don't strain during bowel movements. High-Fiber Foods Foods high in fiber include beans, bran cereals and whole-grain breads, peas, dried fruit (figs, apricots, and dates), raspberries, blackberries, strawberries, sweet corn, broccoli, baked potatoes with skin, plums, pears, apples, greens, and nuts. Activity Slowly increase your activity. Be sure to get up and walk every hour or so to prevent blood clots. No heavy lifting or strenuous activity for 4 weeks following surgery to prevent hernias at your incision sites or recurrence of your hernia. It is normal to feel tired. You may need more sleep than usual.  Get your rest but make sure to get up and move around frequently to prevent blood clots and pneumonia.  Work and Return to [employer] can go back to work when you feel well enough. Discuss the timing with your surgeon. You can usually go back to school or work 1 week or less after an laparoscopic or an open repair. If your work requires heavy lifting or strenuous activity you need to be placed on light duty for 4 weeks following surgery. You can return to gym class, sports or other physical activities 4 weeks after surgery.  Wound Care You may experience significant bruising throughout the abdominal wall that may track down into the groin including into the scrotum in males.  Rest, elevating the groin and scrotum above the level of the heart, ice and compression with tight fitting underwear or an abdominal binder can help.   Always wash your hands before and after touching near your incision site. Do not soak in a bathtub until cleared at your follow up appointment. You may take a shower 24 hours after surgery. A small amount of drainage from the incision is normal. If the drainage is thick and yellow or the site is red, you may have an infection, so call your surgeon. If you have a drain in one of your incisions, it will be taken out in office when the drainage stops. Steri-Strips will fall off in 7 to 10 days or they will be removed during your first office visit. If you have dermabond glue covering over the incision, allow the glue to flake off on its own. Protect the new skin, especially from the sun. The sun can burn and cause darker scarring. Your scar will heal in about 4 to 6 weeks and will become softer and continue to fade over the next year.  The cosmetic appearance of the incisions will improve over the course of the first year after surgery. Sensation around your incision will return in a few weeks or months.  Bowel Movements After intestinal surgery, you may have loose watery stools for several days. If watery diarrhea lasts longer than 3 days, contact your surgeon. Pain medication (narcotics) can cause constipation. Increase the fiber in your diet with high-fiber foods if you are constipated. You can take an over the counter stool softener like Colace to avoid constipation.  Additional over the counter medications can also be used  if Colace isn't sufficient (for example, Milk of Magnesia or Miralax).  Pain The amount of pain is different for each person. Some people need only 1 to 3 doses of pain control medication, while others need more. Take alternating doses of tylenol and ibuprofen around the clock for the first five days following surgery.  This will provide a baseline of pain control and help with inflammation.  Take the narcotic pain medication in addition if needed for severe pain.  Contact  Your Surgeon at [phone], if you have: Pain that will not go away Pain that gets worse A fever of more than 101F (38.3C) Repeated vomiting Swelling, redness, bleeding, or bad-smelling drainage from your wound site Strong abdominal pain No bowel movement or unable to pass gas for 3 days Watery diarrhea lasting longer than 3 days  Pain Control The goal of pain control is to minimize pain, keep you moving and help you heal. Your surgical team will work with you on your pain plan. Most often a combination of therapies and medications are used to control your pain. You may also be given medication (local anesthetic) at the surgical site. This may help control your pain for several days. Extreme pain puts extra stress on your body at a time when your body needs to focus on healing. Do not wait until your pain has reached a level "10" or is unbearable before telling your doctor or nurse. It is much easier to control pain before it becomes severe. Following a laparoscopic procedure, pain is sometimes felt in the shoulder. This is due to the gas inserted into your abdomen during the procedure. Moving and walking helps to decrease the gas and the right shoulder pain.  Use the guide below for ways to manage your post-operative pain. Learn more by going to facs.org/safepaincontrol.  How Intense Is My Pain Common Therapies to Feel Better       I hardly notice my pain, and it does not interfere with my activities.  I notice my pain and it distracts me, but I can still do activities (sitting up, walking, standing).  Non-Medication Therapies  Ice (in a bag, applied over clothing at the surgical site), elevation, rest, meditation, massage, distraction (music, TV, play) walking and mild exercise Splinting the abdomen with pillows +  Non-Opioid Medications Acetaminophen (Tylenol) Non-steroidal anti-inflammatory drugs (NSAIDS) Aspirin, Ibuprofen (Motrin, Advil) Naproxen (Aleve) Take these as  needed, when you feel pain. Both acetaminophen and NSAIDs help to decrease pain and swelling (inflammation).      My pain is hard to ignore and is more noticeable even when I rest.  My pain interferes with my usual activities.  Non-Medication Therapies  +  Non-Opioid medications  Take on a regular schedule (around-the-clock) instead of as needed. (For example, Tylenol every 6 hours at 9:00 am, 3:00 pm, 9:00 pm, 3:00 am and Motrin every 6 hours at 12:00 am, 6:00 am, 12:00 pm, 6:00 pm)         I am focused on my pain, and I am not doing my daily activities.  I am groaning in pain, and I cannot sleep. I am unable to do anything.  My pain is as bad as it could be, and nothing else matters.  Non-Medication Therapies  +  Around-the-Clock Non-Opioid Medications  +  Short-acting opioids  Opioids should be used with other medications to manage severe pain. Opioids block pain and give a feeling of euphoria (feel high). Addiction, a serious side effect of opioids, is  rare with short-term (a few days) use.  Examples of short-acting opioids include: Tramadol (Ultram), Hydrocodone (Norco, Vicodin), Hydromorphone (Dilaudid), Oxycodone (Oxycontin)     The above directions have been adapted from the [employer] of [employer] Surgical Patient Education Program.  Please refer to the ACS website if needed: http://kaiser.net/   Ivar Drape, MD [facility], [place] [address], [address], [place], [place]  [address] ?  [address] [address], [place], [place]   [address] [phone] ? [phone] ? FAX [phone] Web site: [email or web address]

## 2024-04-17 NOTE — Anesthesia Procedure Notes (Signed)
 Procedure Name: Intubation Date/Time: 04/17/2024 3:43 PM  Performed by: Lanning Cena RAMAN, CRNAPre-anesthesia Checklist: Patient identified, Emergency Drugs available, Suction available and Patient being monitored Patient Re-evaluated:Patient Re-evaluated prior to induction Oxygen  Delivery Method: Circle system utilized Preoxygenation: Pre-oxygenation with 100% oxygen  Induction Type: IV induction Ventilation: Mask ventilation without difficulty Laryngoscope Size: Miller and 2 Grade View: Grade I Tube type: Oral Tube size: 7.0 mm Number of attempts: 1 Airway Equipment and Method: Stylet Placement Confirmation: ETT inserted through vocal cords under direct vision, positive ETCO2, CO2 detector and breath sounds checked- equal and bilateral Secured at: 19 cm Tube secured with: Tape Dental Injury: Teeth and Oropharynx as per pre-operative assessment

## 2024-04-17 NOTE — Plan of Care (Signed)
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
## 2024-04-18 ENCOUNTER — Encounter (HOSPITAL_COMMUNITY): Payer: Self-pay | Admitting: Surgery

## 2024-04-18 DIAGNOSIS — K429 Umbilical hernia without obstruction or gangrene: Secondary | ICD-10-CM | POA: Diagnosis not present

## 2024-04-18 LAB — URINALYSIS, ROUTINE W REFLEX MICROSCOPIC
Bilirubin Urine: NEGATIVE
Glucose, UA: NEGATIVE mg/dL
Hgb urine dipstick: NEGATIVE
Ketones, ur: NEGATIVE mg/dL
Leukocytes,Ua: NEGATIVE
Nitrite: NEGATIVE
Protein, ur: NEGATIVE mg/dL
Specific Gravity, Urine: 1.012 (ref 1.005–1.030)
pH: 5 (ref 5.0–8.0)

## 2024-04-18 NOTE — Plan of Care (Signed)
   Problem: Activity: Goal: Risk for activity intolerance will decrease Outcome: Progressing
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
## 2024-04-18 NOTE — Progress Notes (Signed)
 Secretary notified nurse of pt calling 911, as she had received a call from 911 stating that the pt had called them. Nurse entered into pt's room. Pt stated that she forgot where she was, right away and that she remembered that she was in the hospital. Nurse helped to reorient her by stating where she was at and why. Encouraged pt to press call bell if she needed any help. Pt stated that she had wanted to use the bathroom. Pt has purewick intact. Encouraged her to use it and explained how it works to her. Bed alarm remains on

## 2024-04-18 NOTE — Progress Notes (Signed)
 Progress Note: General Surgery Service   Chief Complaint/Subjective: Patient called 911 last night. Patient does not feel comfortable returning home - where she lives alone  Objective: Vital signs in last 24 hours: Temp:  [97.4 F (36.3 C)-98 F (36.7 C)] 97.4 F (36.3 C) (11/04 0602) Pulse Rate:  [59-83] 59 (11/04 0602) Resp:  [9-18] 16 (11/04 0602) BP: (117-143)/(51-89) 123/51 (11/04 0602) SpO2:  [90 %-100 %] 100 % (11/04 0602) Weight:  [102.1 kg] 102.1 kg (11/03 1325) Last BM Date : 04/16/24  Intake/Output from previous day: 11/03 0701 - 11/04 0700 In: 750 [I.V.:650; IV Piggyback:100] Out: 0  Intake/Output this shift: No intake/output data recorded.  GI: Abd incision c/d/i  Lab Results: CBC  Recent Labs    04/17/24 1817  WBC 7.0  HGB 11.3*  HCT 37.0  PLT 168   BMET Recent Labs    04/17/24 1817  CREATININE 0.53   PT/INR No results for input(s): LABPROT, INR in the last 72 hours. ABG No results for input(s): PHART, HCO3 in the last 72 hours.  Invalid input(s): PCO2, PO2  Anti-infectives: Anti-infectives (From admission, onward)    Start     Dose/Rate Route Frequency Ordered Stop   04/18/24 0600  ceFAZolin  (ANCEF ) IVPB 2g/100 mL premix        2 g 200 mL/hr over 30 Minutes Intravenous On call to O.R. 04/17/24 1253 04/17/24 1615       Medications: Scheduled Meds:  acetaminophen   650 mg Oral Q6H   aspirin  EC  81 mg Oral Daily   atorvastatin   20 mg Oral Daily   benazepril   10 mg Oral Daily   docusate sodium  100 mg Oral BID   enoxaparin  (LOVENOX ) injection  40 mg Subcutaneous Q24H   famotidine   20 mg Oral BID   gabapentin   100 mg Oral BID   levothyroxine   75 mcg Oral QAC breakfast   Continuous Infusions: PRN Meds:.HYDROmorphone  (DILAUDID ) injection, methocarbamol (ROBAXIN) injection, ondansetron  (ZOFRAN ) IV, prochlorperazine, traMADol   Assessment/Plan:  REPAIR, HERNIA, UMBILICAL, ADULT 04/17/2024  Baseline functional mobility  issues Delirium overnight - called 911 Hernia repair healing well Consult TOC, PT, OT to evaluate patient for safe discharge plan, may not be able to return home alone    LOS: 0 days    Deward JINNY Foy, MD  [facility] Use [email or web address] to contact on call provider  Daily Billing: [id number] - post op

## 2024-04-19 ENCOUNTER — Other Ambulatory Visit (HOSPITAL_COMMUNITY): Payer: Self-pay

## 2024-04-19 DIAGNOSIS — K429 Umbilical hernia without obstruction or gangrene: Secondary | ICD-10-CM | POA: Diagnosis not present

## 2024-04-19 MED ORDER — TRAMADOL HCL 50 MG PO TABS
50.0000 mg | ORAL_TABLET | Freq: Four times a day (QID) | ORAL | 0 refills | Status: AC | PRN
  Filled 2024-04-19: qty 20, 5d supply, fill #0
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
## 2024-04-19 NOTE — Care Management Obs Status (Signed)
 MEDICARE OBSERVATION STATUS NOTIFICATION   Patient Details  Name: Kelly [name] MRN: [id number] Date of Birth: 06-27-1950   Medicare Observation Status Notification Given:  Chaney NORMAN ASPEN, LCSW 04/19/2024, 3:02 PM

## 2024-04-19 NOTE — TOC Transition Note (Signed)
 Transition of Care [facility]) - Discharge Note   Patient Details  Name: Kelly [name] MRN: [id number] Date of Birth: 11/12/1950  Transition of Care [facility] CM/SW Contact:  NORMAN ASPEN, LCSW Phone Number: 04/19/2024, 2:53 PM   Clinical Narrative:     Met with pt today to review dc needs.  Yesterday, pt noted to be concerned about dc home alone and considering SNF.  Today she had much better session with PT and feels ready to dc home.  HHPT/OT orders placed and pt would like to have [facility] again - referral placed/ accepted.  Pt will need PTAR for dc transportation. PTAR called at 1500.    Final next level of care: Home w Home Health Services Barriers to Discharge: Barriers Resolved   Patient Goals and CMS Choice            Discharge Placement                       Discharge Plan and Services Additional resources added to the After Visit Summary for                  DME Arranged: N/A DME Agency: NA       HH Arranged: PT, OT HH Agency: [facility] Date [facility] Agency Contacted: 04/19/24 Time HH Agency Contacted: 1300 Representative spoke with at [facility] Agency: Cindie  Social Drivers of Health (SDOH) Interventions SDOH Screenings   Food Insecurity: No Food Insecurity (04/17/2024)  Housing: Low Risk  (04/17/2024)  Transportation Needs: No Transportation Needs (04/17/2024)  Utilities: Not At Risk (04/17/2024)  Alcohol Screen: Low Risk  (06/29/2023)  Depression (PHQ2-9): Low Risk  (06/29/2023)  Financial Resource Strain: Low Risk  (06/29/2023)  Physical Activity: Inactive (06/29/2023)  Social Connections: Moderately Isolated (04/17/2024)  Stress: No Stress Concern Present (06/29/2023)  Tobacco Use: Medium Risk (04/17/2024)  Health Literacy: Adequate Health Literacy (06/29/2023)     Readmission Risk Interventions     No data to display
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
## 2024-04-19 NOTE — Discharge Summary (Signed)
 Patient ID: Kelly [name] [id number] 73 y.o. Jun 06, 1951  04/17/2024  Discharge date and time: 04/19/2024  Admitting Physician: Deward PARAS Lakeysha Slutsky  Discharge Physician: Deward PARAS Esaias Cleavenger  Admission Diagnoses: Umbilical hernia [K42.9] Patient Active Problem List   Diagnosis Date Noted   Abscess 11/11/2023   Preventative health care 04/16/2023   Abnormal ankle brachial index (ABI) 01/11/2023   Leg cramps 01/11/2023   Numbness of right hand 01/11/2023   Suspicious nevus 01/11/2023   Hemiparesis, left (HCC) 06/26/2021   Acquired trigger finger of right middle finger 10/04/2020   Carpal tunnel syndrome of right wrist 09/06/2020   Nausea vomiting and diarrhea 06/18/2020   Hematoma 03/15/2018   Fibroid uterus 08/10/2017   Bilateral lower extremity edema 12/14/2016   Varicose veins of both legs with edema 10/16/2016   Pain of right calf 07/20/2016   Metatarsal bone fracture 01/14/2016   Chest pain 07/17/2015   Hypothyroidism 07/17/2015   Pain in the chest    Fractured toe 03/15/2015   Back pain, thoracic 10/17/2014   Hip pain 10/17/2014   Sciatica of right side 10/17/2014   Benign paroxysmal positional vertigo 09/16/2014   Frequent falls 09/16/2014   Gait instability 09/16/2014   Postmenopausal vaginal bleeding 05/08/2014   Injury of left foot 11/01/2013   Obesity (BMI 30-39.9) 07/24/2013   Ankle pain, left 02/19/2012   Foot pain, left 02/19/2012   Wound infection 02/19/2012   COLONIC POLYPS 05/15/2010   Umbilical hernia 07/01/2009   ABDOMINAL PAIN, LEFT LOWER QUADRANT 05/16/2009   ABDOMINAL PAIN OTHER SPECIFIED SITE 04/29/2009   Hypertrophic and atrophic condition of skin 04/05/2009   BACK PAIN, THORACIC REGION 04/01/2009   CHEST PAIN, ATYPICAL 04/01/2009   ALLERGIC RHINITIS DUE TO POLLEN 11/02/2008   Osteoporosis 08/30/2008   Morbid (severe) obesity due to excess calories (HCC) 08/02/2008   Backache 04/27/2008   HYPERGLYCEMIA 01/04/2008   ASTHMATIC  BRONCHITIS, ACUTE 10/18/2007   EDEMA 10/06/2007   Pain in limb 04/28/2007   PULMONARY EMBOLISM, HX OF 03/12/2007   Hyperlipidemia 09/29/2006   Paralysis (HCC) 09/29/2006   Essential hypertension 09/29/2006   Acute thromboembolism of deep veins of lower extremity (HCC) 09/29/2006   PULMONARY EDEMA 09/29/2006   Osteoarthritis 09/29/2006   BREAST CANCER, HX OF 09/29/2006   KNEE REPLACEMENT, RIGHT, HX OF 09/29/2006   Other postprocedural status(V45.89) 09/29/2006     Discharge Diagnoses:  Patient Active Problem List   Diagnosis Date Noted   Abscess 11/11/2023   Preventative health care 04/16/2023   Abnormal ankle brachial index (ABI) 01/11/2023   Leg cramps 01/11/2023   Numbness of right hand 01/11/2023   Suspicious nevus 01/11/2023   Hemiparesis, left (HCC) 06/26/2021   Acquired trigger finger of right middle finger 10/04/2020   Carpal tunnel syndrome of right wrist 09/06/2020   Nausea vomiting and diarrhea 06/18/2020   Hematoma 03/15/2018   Fibroid uterus 08/10/2017   Bilateral lower extremity edema 12/14/2016   Varicose veins of both legs with edema 10/16/2016   Pain of right calf 07/20/2016   Metatarsal bone fracture 01/14/2016   Chest pain 07/17/2015   Hypothyroidism 07/17/2015   Pain in the chest    Fractured toe 03/15/2015   Back pain, thoracic 10/17/2014   Hip pain 10/17/2014   Sciatica of right side 10/17/2014   Benign paroxysmal positional vertigo 09/16/2014   Frequent falls 09/16/2014   Gait instability 09/16/2014   Postmenopausal vaginal bleeding 05/08/2014   Injury of left foot 11/01/2013   Obesity (BMI 30-39.9) 07/24/2013  Ankle pain, left 02/19/2012   Foot pain, left 02/19/2012   Wound infection 02/19/2012   COLONIC POLYPS 05/15/2010   Umbilical hernia 07/01/2009   ABDOMINAL PAIN, LEFT LOWER QUADRANT 05/16/2009   ABDOMINAL PAIN OTHER SPECIFIED SITE 04/29/2009   Hypertrophic and atrophic condition of skin 04/05/2009   BACK PAIN, THORACIC REGION  04/01/2009   CHEST PAIN, ATYPICAL 04/01/2009   ALLERGIC RHINITIS DUE TO POLLEN 11/02/2008   Osteoporosis 08/30/2008   Morbid (severe) obesity due to excess calories (HCC) 08/02/2008   Backache 04/27/2008   HYPERGLYCEMIA 01/04/2008   ASTHMATIC BRONCHITIS, ACUTE 10/18/2007   EDEMA 10/06/2007   Pain in limb 04/28/2007   PULMONARY EMBOLISM, HX OF 03/12/2007   Hyperlipidemia 09/29/2006   Paralysis (HCC) 09/29/2006   Essential hypertension 09/29/2006   Acute thromboembolism of deep veins of lower extremity (HCC) 09/29/2006   PULMONARY EDEMA 09/29/2006   Osteoarthritis 09/29/2006   BREAST CANCER, HX OF 09/29/2006   KNEE REPLACEMENT, RIGHT, HX OF 09/29/2006   Other postprocedural status(V45.89) 09/29/2006    Operations: Procedure(s): REPAIR, HERNIA, UMBILICAL, ADULT  Admission Condition: good  Discharged Condition: good  Indication for Admission: Umbilical hernia  Hospital Course: Umbilical hernia repair Baseline functional mobility issues Delirium overnight - called 911 Hernia repair healing well Consult TOC, PT, OT to evaluate patient for safe discharge plan, may not be able to return home alone  Symptoms improved  Consults: None  Significant Diagnostic Studies: None  Treatments: surgery: as above  Disposition: Home  Patient Instructions:  Allergies as of 04/19/2024       Reactions   Percocet [oxycodone -acetaminophen ] Nausea And Vomiting   Codeine Nausea Only   Flexeril  [cyclobenzaprine ] Other (See Comments)   About made me crazy   Naproxen  Nausea And Vomiting   Aspirin  Other (See Comments)   GI UPSET CAN TAKE LOW DOSE ASPIRIN    Tape Rash   PAPER TAPE IS OK        Medication List     TAKE these medications    aspirin  EC 81 MG tablet Take 81 mg by mouth daily.   atorvastatin  20 MG tablet Commonly known as: LIPITOR TAKE 1 TABLET BY MOUTH DAILY.   benazepril  10 MG tablet Commonly known as: LOTENSIN  TAKE 1 TABLET (10 MG TOTAL) BY MOUTH DAILY.    diclofenac  75 MG EC tablet Commonly known as: VOLTAREN  Take 1 tablet (75 mg total) by mouth 2 (two) times daily.   famotidine  20 MG tablet Commonly known as: PEPCID  TAKE 1 TABLET BY MOUTH 2 TIMES DAILY.   furosemide  40 MG tablet Commonly known as: LASIX  Take 1 tablet (40 mg total) by mouth daily.   gabapentin  100 MG capsule Commonly known as: NEURONTIN  TAKE 1 CAPSULE BY MOUTH 2 TIMES DAILY.   ibuprofen  600 MG tablet Commonly known as: ADVIL  Take 1 tablet (600 mg total) by mouth every 8 (eight) hours as needed. What changed: reasons to take this   potassium chloride  SA 20 MEQ tablet Commonly known as: KLOR-CON  M Take 1 tablet (20 mEq total) by mouth daily.   Synthroid  75 MCG tablet Generic drug: levothyroxine  Take 1 tablet (75 mcg total) by mouth daily before breakfast.   traMADol  50 MG tablet Commonly known as: ULTRAM  Take 1 tablet (50 mg total) by mouth every 6 (six) hours as needed. What changed: reasons to take this   traMADol  50 MG tablet Commonly known as: Ultram  Take 1 tablet (50 mg total) by mouth every 6 (six) hours as needed. What changed: You were already  taking a medication with the same name, and this prescription was added. Make sure you understand how and when to take each.   Vitamin D3 50 MCG (2000 UT) capsule Take 2,000 Units by mouth daily.        Activity: no heavy lifting for 4 weeks Diet: regular diet Wound Care: keep wound clean and dry  Follow-up:  With Dr. Lyndel  Signed: Deward PARAS Cassadi Purdie [facility], [place]   04/19/2024, 8:24 AM

## 2024-04-19 NOTE — Progress Notes (Signed)
 Discharge medication delivered to patient at the bedside in a secure bag

## 2024-04-25 ENCOUNTER — Encounter: Payer: Self-pay | Admitting: Family Medicine

## 2024-04-25 ENCOUNTER — Ambulatory Visit (INDEPENDENT_AMBULATORY_CARE_PROVIDER_SITE_OTHER): Admitting: Family Medicine

## 2024-04-25 VITALS — BP 134/80 | HR 70 | Temp 97.7°F | Resp 18 | Ht 60.0 in

## 2024-04-25 DIAGNOSIS — E0849 Diabetes mellitus due to underlying condition with other diabetic neurological complication: Secondary | ICD-10-CM | POA: Diagnosis not present

## 2024-04-25 DIAGNOSIS — Z1159 Encounter for screening for other viral diseases: Secondary | ICD-10-CM

## 2024-04-25 DIAGNOSIS — I10 Essential (primary) hypertension: Secondary | ICD-10-CM | POA: Diagnosis not present

## 2024-04-25 DIAGNOSIS — E785 Hyperlipidemia, unspecified: Secondary | ICD-10-CM | POA: Diagnosis not present

## 2024-04-25 DIAGNOSIS — Z0000 Encounter for general adult medical examination without abnormal findings: Secondary | ICD-10-CM | POA: Diagnosis not present

## 2024-04-25 DIAGNOSIS — E039 Hypothyroidism, unspecified: Secondary | ICD-10-CM | POA: Diagnosis not present

## 2024-04-25 DIAGNOSIS — R600 Localized edema: Secondary | ICD-10-CM

## 2024-04-25 DIAGNOSIS — G8194 Hemiplegia, unspecified affecting left nondominant side: Secondary | ICD-10-CM | POA: Diagnosis not present

## 2024-04-25 DIAGNOSIS — D229 Melanocytic nevi, unspecified: Secondary | ICD-10-CM

## 2024-04-25 LAB — COMPREHENSIVE METABOLIC PANEL WITH GFR
ALT: 27 U/L (ref 0–35)
AST: 20 U/L (ref 0–37)
Albumin: 3.7 g/dL (ref 3.5–5.2)
Alkaline Phosphatase: 126 U/L — ABNORMAL HIGH (ref 39–117)
BUN: 8 mg/dL (ref 6–23)
CO2: 30 meq/L (ref 19–32)
Calcium: 8.6 mg/dL (ref 8.4–10.5)
Chloride: 104 meq/L (ref 96–112)
Creatinine, Ser: 0.5 mg/dL (ref 0.40–1.20)
GFR: 91.93 mL/min (ref >60.00)
Glucose, Bld: 123 mg/dL — ABNORMAL HIGH (ref 70–99)
Potassium: 3.5 meq/L (ref 3.5–5.1)
Sodium: 143 meq/L (ref 135–145)
Total Bilirubin: 1.1 mg/dL (ref 0.2–1.2)
Total Protein: 6.3 g/dL (ref 6.0–8.3)

## 2024-04-25 LAB — LIPID PANEL
Cholesterol: 133 mg/dL (ref 0–200)
HDL: 32 mg/dL — ABNORMAL LOW (ref >39.00)
LDL Cholesterol: 67 mg/dL (ref 0–99)
NonHDL: 101.19
Total CHOL/HDL Ratio: 4
Triglycerides: 172 mg/dL — ABNORMAL HIGH (ref 0.0–149.0)
VLDL: 34.4 mg/dL (ref 0.0–40.0)

## 2024-04-25 LAB — CBC WITH DIFFERENTIAL/PLATELET
Basophils Absolute: 0.1 K/uL (ref 0.0–0.1)
Basophils Relative: 0.8 % (ref 0.0–3.0)
Eosinophils Absolute: 0.2 K/uL (ref 0.0–0.7)
Eosinophils Relative: 2.7 % (ref 0.0–5.0)
HCT: 35.7 % — ABNORMAL LOW (ref 36.0–46.0)
Hemoglobin: 11.5 g/dL — ABNORMAL LOW (ref 12.0–15.0)
Lymphocytes Relative: 15.6 % (ref 12.0–46.0)
Lymphs Abs: 1.1 K/uL (ref 0.7–4.0)
MCHC: 32.3 g/dL (ref 30.0–36.0)
MCV: 85.5 fl (ref 78.0–100.0)
Monocytes Absolute: 0.3 K/uL (ref 0.1–1.0)
Monocytes Relative: 4.5 % (ref 3.0–12.0)
Neutro Abs: 5.6 K/uL (ref 1.4–7.7)
Neutrophils Relative %: 76.4 % (ref 43.0–77.0)
Platelets: 209 K/uL (ref 150.0–400.0)
RBC: 4.17 Mil/uL (ref 3.87–5.11)
RDW: 16.7 % — ABNORMAL HIGH (ref 11.5–15.5)
WBC: 7.3 K/uL (ref 4.0–10.5)

## 2024-04-25 LAB — TSH: TSH: 1.89 u[IU]/mL (ref 0.35–5.50)

## 2024-04-25 LAB — HEMOGLOBIN A1C: Hgb A1c MFr Bld: 5.7 % (ref 4.6–6.5)

## 2024-04-25 NOTE — Assessment & Plan Note (Signed)
Check labs  On synthroid 

## 2024-04-25 NOTE — Assessment & Plan Note (Signed)
 Well controlled, no changes to meds. Encouraged heart healthy diet such as the DASH diet and exercise as tolerated.

## 2024-04-25 NOTE — Assessment & Plan Note (Signed)
 Lab Results  Component Value Date   HGBA1C 5.8 (H) 12/03/2023
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
## 2024-04-25 NOTE — Assessment & Plan Note (Signed)
 Encourage heart healthy diet such as MIND or DASH diet, increase exercise, avoid trans fats, simple carbohydrates and processed foods, consider a krill or fish or flaxseed oil cap daily.

## 2024-04-25 NOTE — Assessment & Plan Note (Signed)
 stable

## 2024-04-26 ENCOUNTER — Telehealth: Payer: Self-pay | Admitting: *Deleted

## 2024-04-26 LAB — HEPATITIS C ANTIBODY: Hepatitis C Ab: NONREACTIVE

## 2024-04-26 NOTE — Telephone Encounter (Signed)
-----   Message from Jamee JONELLE Antonio Cyndee sent at 04/25/2024 11:18 AM EST ----- She still has not heard anything about her new power wheelchair

## 2024-04-26 NOTE — Telephone Encounter (Signed)
 Spoke with patient and advised her to call the gentleman that was helping her out to see what we need to do or what they are waiting on.  She will give him a call to see what is going on and have him call us  if anything is needed.

## 2024-05-03 NOTE — Telephone Encounter (Signed)
 Left message on machine for patient to call back.  Wanted to follow up to see if she had contacted gentleman about motorized chair.  If he has not contacted her yet then let us  know his number and also what company she is dealing with so we can contact them.

## 2024-05-04 ENCOUNTER — Ambulatory Visit: Payer: Self-pay | Admitting: Family Medicine

## 2024-05-09 NOTE — Telephone Encounter (Signed)
 Per patient she spoke with someone last week and they stated that she should have her chair in 10 days.  Advised that if she does not receive in that amount of time to make sure she follows up with them.

## 2024-05-26 ENCOUNTER — Other Ambulatory Visit: Payer: Self-pay | Admitting: Family Medicine

## 2024-05-26 DIAGNOSIS — I10 Essential (primary) hypertension: Secondary | ICD-10-CM

## 2024-06-29 ENCOUNTER — Ambulatory Visit: Payer: 59 | Admitting: *Deleted

## 2024-06-29 VITALS — Ht 60.0 in | Wt 224.0 lb

## 2024-06-29 DIAGNOSIS — Z1231 Encounter for screening mammogram for malignant neoplasm of breast: Secondary | ICD-10-CM

## 2024-06-29 DIAGNOSIS — Z0000 Encounter for general adult medical examination without abnormal findings: Secondary | ICD-10-CM | POA: Diagnosis not present

## 2024-06-29 DIAGNOSIS — Z780 Asymptomatic menopausal state: Secondary | ICD-10-CM

## 2024-06-29 DIAGNOSIS — M8580 Other specified disorders of bone density and structure, unspecified site: Secondary | ICD-10-CM | POA: Diagnosis not present

## 2024-06-29 NOTE — Patient Instructions (Addendum)
 Kelly [name],  Thank you for taking the time for your Medicare Wellness Visit. I appreciate your continued commitment to your health goals. Please review the care plan we discussed, and feel free to reach out if I can assist you further.  Please note that Annual Wellness Visits do not include a physical exam. Some assessments may be limited, especially if the visit was conducted virtually. If needed, we may recommend an in-person follow-up with your provider.  Goal: To maintain weight under 240, currently 224  Ongoing Care Seeing your primary care provider every 3 to 6 months helps us  monitor your health and provide consistent, personalized care.   Dr Antonio Meth: 07/04/24 3pm Medicare AWV: 07/04/25  11am, telephone  Referrals If a referral was made during today's visit and you haven't received any updates within two weeks, please contact the referred provider directly to check on the status.  Bone Density ([facility]) due 07/03/24: [phone] Mammogram ([facility]) due 01/18/25: [phone] Diabetic Eye Exam:  Please send us  the name of the doctor or group so we can get your most recent result.  Recommended Screenings: You will need to get the following vaccines at your local pharmacy: Tdap (tetanus), Covid  Health Maintenance  Topic Date Due   Complete foot exam   Never done   Yearly kidney health urinalysis for diabetes  07/01/2010   Eye exam for diabetics  04/09/2021   DTaP/Tdap/Td vaccine (5 - Td or Tdap) 10/30/2023   COVID-19 Vaccine (4 - 2025-26 season) 02/14/2024   Osteoporosis screening with Bone Density Scan  07/02/2024   Hemoglobin A1C  10/23/2024   Medicare Annual Wellness Visit  10/31/2024   Yearly kidney function blood test for diabetes  04/25/2025   Colon Cancer Screening  01/04/2031   Pneumococcal Vaccine for age over 108  Completed   Flu Shot  Completed   Hepatitis C Screening  Completed   Zoster (Shingles) Vaccine  Completed   Meningitis B  Vaccine  Aged Out   Breast Cancer Screening  Discontinued      06/29/2024   11:11 AM  Advanced Directives  Does Patient Have a Medical Advance Directive? Yes  Type of Estate Agent of [place];Living will  Does patient want to make changes to medical advance directive? No - Patient declined  Copy of Healthcare Power of Attorney in Chart? No - copy requested   Bring a copy of your health care power of attorney and living will to the office to be added to your chart at your convenience. You can mail a copy to [facility] [address] [place], [place] [address] or email to [email or web address]   Vision: Annual vision screenings are recommended for early detection of glaucoma, cataracts, and diabetic retinopathy. These exams can also reveal signs of chronic conditions such as diabetes and high blood pressure.  Dental: Annual dental screenings help detect early signs of oral cancer, gum disease, and other conditions linked to overall health, including heart disease and diabetes.  Please see the attached documents for additional preventive care recommendations.
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
## 2024-07-04 ENCOUNTER — Encounter: Payer: Self-pay | Admitting: Family Medicine

## 2024-07-04 ENCOUNTER — Ambulatory Visit: Admitting: Family Medicine

## 2024-07-04 VITALS — BP 118/80 | HR 78 | Temp 97.8°F | Resp 16 | Ht 60.0 in

## 2024-07-04 DIAGNOSIS — M792 Neuralgia and neuritis, unspecified: Secondary | ICD-10-CM | POA: Diagnosis not present

## 2024-07-04 DIAGNOSIS — M6530 Trigger finger, unspecified finger: Secondary | ICD-10-CM

## 2024-07-04 MED ORDER — GABAPENTIN 100 MG PO CAPS: ORAL_CAPSULE | ORAL | 1 refills | Status: AC
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
## 2024-07-14 ENCOUNTER — Ambulatory Visit: Admitting: Podiatry

## 2024-07-14 ENCOUNTER — Encounter: Payer: Self-pay | Admitting: Podiatry

## 2024-07-14 DIAGNOSIS — M21612 Bunion of left foot: Secondary | ICD-10-CM

## 2024-07-14 DIAGNOSIS — M7752 Other enthesopathy of left foot: Secondary | ICD-10-CM

## 2024-07-17 NOTE — Progress Notes (Signed)
 Subjective:   Patient ID: Kelly [name], female   DOB: 74 y.o.   MRN: [id number]   HPI Patient states she has developed a lot of keratotic tissue on the medial side of the left first metatarsal and she knows she has a structural deformity associated with this is not sure whether or not correction would be possible   ROS      Objective:  Physical Exam  Neurovascular status intact with the patient found to have keratotic tissue on the medial side of the left first metatarsal with rotated hallux and inflammation fluid buildup around the area.  Good digital perfusion well-oriented x 3     Assessment:  Inflammatory capsulitis of the first MPJ left chronic with structural deformity with also keratotic tissue which forms associated with it     Plan:  H&P reviewed at this point sterile prep and did discuss possibility for injection therapy or possibility for structural correction but would be a difficult condition due to her physical condition.  At this point courtesy debridement I want to see the results of this and decide what else may be necessary

## 2025-07-04 ENCOUNTER — Ambulatory Visit
